# Patient Record
Sex: Female | Born: 1945 | Race: White | Hispanic: No | Smoking: Former smoker
Health system: Southern US, Community
[De-identification: ages and names within clinical notes are randomized; demographics above are authoritative.]

## PROBLEM LIST (undated history)

## (undated) DIAGNOSIS — M8580 Other specified disorders of bone density and structure, unspecified site: Secondary | ICD-10-CM

## (undated) DIAGNOSIS — K589 Irritable bowel syndrome without diarrhea: Secondary | ICD-10-CM

## (undated) DIAGNOSIS — F329 Major depressive disorder, single episode, unspecified: Secondary | ICD-10-CM

## (undated) DIAGNOSIS — J449 Chronic obstructive pulmonary disease, unspecified: Secondary | ICD-10-CM

## (undated) DIAGNOSIS — J45909 Unspecified asthma, uncomplicated: Secondary | ICD-10-CM

## (undated) DIAGNOSIS — R87810 Cervical high risk human papillomavirus (HPV) DNA test positive: Secondary | ICD-10-CM

## (undated) DIAGNOSIS — C50912 Malignant neoplasm of unspecified site of left female breast: Secondary | ICD-10-CM

## (undated) DIAGNOSIS — E785 Hyperlipidemia, unspecified: Secondary | ICD-10-CM

## (undated) DIAGNOSIS — I10 Essential (primary) hypertension: Secondary | ICD-10-CM

## (undated) DIAGNOSIS — K219 Gastro-esophageal reflux disease without esophagitis: Secondary | ICD-10-CM

## (undated) DIAGNOSIS — C541 Malignant neoplasm of endometrium: Secondary | ICD-10-CM

## (undated) DIAGNOSIS — F419 Anxiety disorder, unspecified: Secondary | ICD-10-CM

## (undated) DIAGNOSIS — G43909 Migraine, unspecified, not intractable, without status migrainosus: Secondary | ICD-10-CM

## (undated) DIAGNOSIS — I639 Cerebral infarction, unspecified: Secondary | ICD-10-CM

## (undated) DIAGNOSIS — R87610 Atypical squamous cells of undetermined significance on cytologic smear of cervix (ASC-US): Secondary | ICD-10-CM

## (undated) DIAGNOSIS — C50919 Malignant neoplasm of unspecified site of unspecified female breast: Secondary | ICD-10-CM

## (undated) DIAGNOSIS — M5136 Other intervertebral disc degeneration, lumbar region: Secondary | ICD-10-CM

## (undated) DIAGNOSIS — M51369 Other intervertebral disc degeneration, lumbar region without mention of lumbar back pain or lower extremity pain: Secondary | ICD-10-CM

## (undated) DIAGNOSIS — R011 Cardiac murmur, unspecified: Secondary | ICD-10-CM

## (undated) DIAGNOSIS — E119 Type 2 diabetes mellitus without complications: Secondary | ICD-10-CM

## (undated) DIAGNOSIS — F32A Depression, unspecified: Secondary | ICD-10-CM

## (undated) DIAGNOSIS — M1990 Unspecified osteoarthritis, unspecified site: Secondary | ICD-10-CM

## (undated) HISTORY — DX: Malignant neoplasm of unspecified site of unspecified female breast: C50.919

## (undated) HISTORY — DX: Cardiac murmur, unspecified: R01.1

## (undated) HISTORY — DX: Hyperlipidemia, unspecified: E78.5

## (undated) HISTORY — DX: Chronic obstructive pulmonary disease, unspecified: J44.9

## (undated) HISTORY — DX: Anxiety disorder, unspecified: F41.9

## (undated) HISTORY — DX: Irritable bowel syndrome, unspecified: K58.9

## (undated) HISTORY — PX: CHOLECYSTECTOMY, LAPAROSCOPIC: SHX56

## (undated) HISTORY — DX: Migraine, unspecified, not intractable, without status migrainosus: G43.909

## (undated) HISTORY — DX: Malignant neoplasm of unspecified site of left female breast: C50.912

## (undated) HISTORY — DX: Major depressive disorder, single episode, unspecified: F32.9

## (undated) HISTORY — DX: Cervical high risk human papillomavirus (HPV) DNA test positive: R87.810

## (undated) HISTORY — PX: ABDOMINAL HYSTERECTOMY: SHX81

## (undated) HISTORY — PX: EYE SURGERY: SHX253

## (undated) HISTORY — DX: Essential (primary) hypertension: I10

## (undated) HISTORY — PX: CHOLECYSTECTOMY: SHX55

## (undated) HISTORY — DX: Depression, unspecified: F32.A

## (undated) HISTORY — DX: Unspecified osteoarthritis, unspecified site: M19.90

## (undated) HISTORY — DX: Atypical squamous cells of undetermined significance on cytologic smear of cervix (ASC-US): R87.610

## (undated) HISTORY — PX: ESOPHAGOGASTRODUODENOSCOPY: SHX1529

---

## 1993-11-21 DIAGNOSIS — C50912 Malignant neoplasm of unspecified site of left female breast: Secondary | ICD-10-CM

## 1993-11-21 HISTORY — DX: Malignant neoplasm of unspecified site of left female breast: C50.912

## 2008-03-03 ENCOUNTER — Ambulatory Visit: Payer: Self-pay | Admitting: Gastroenterology

## 2010-10-01 ENCOUNTER — Emergency Department: Payer: Self-pay | Admitting: Unknown Physician Specialty

## 2010-10-12 ENCOUNTER — Ambulatory Visit: Payer: Self-pay | Admitting: Gastroenterology

## 2010-10-13 ENCOUNTER — Inpatient Hospital Stay: Payer: Self-pay | Admitting: Internal Medicine

## 2010-11-21 HISTORY — PX: BREAST LUMPECTOMY: SHX2

## 2010-12-13 ENCOUNTER — Ambulatory Visit: Payer: Self-pay | Admitting: General Surgery

## 2011-05-16 ENCOUNTER — Ambulatory Visit: Payer: Self-pay | Admitting: General Surgery

## 2011-05-31 ENCOUNTER — Ambulatory Visit: Payer: Self-pay | Admitting: General Surgery

## 2011-06-01 LAB — PATHOLOGY REPORT

## 2011-07-06 IMAGING — CR DG OUTSIDE FILMS CHEST
7 of 8 series · 7 of 8 positions shown · non-contrast
Comparison: none

[LM (1 of 7)]
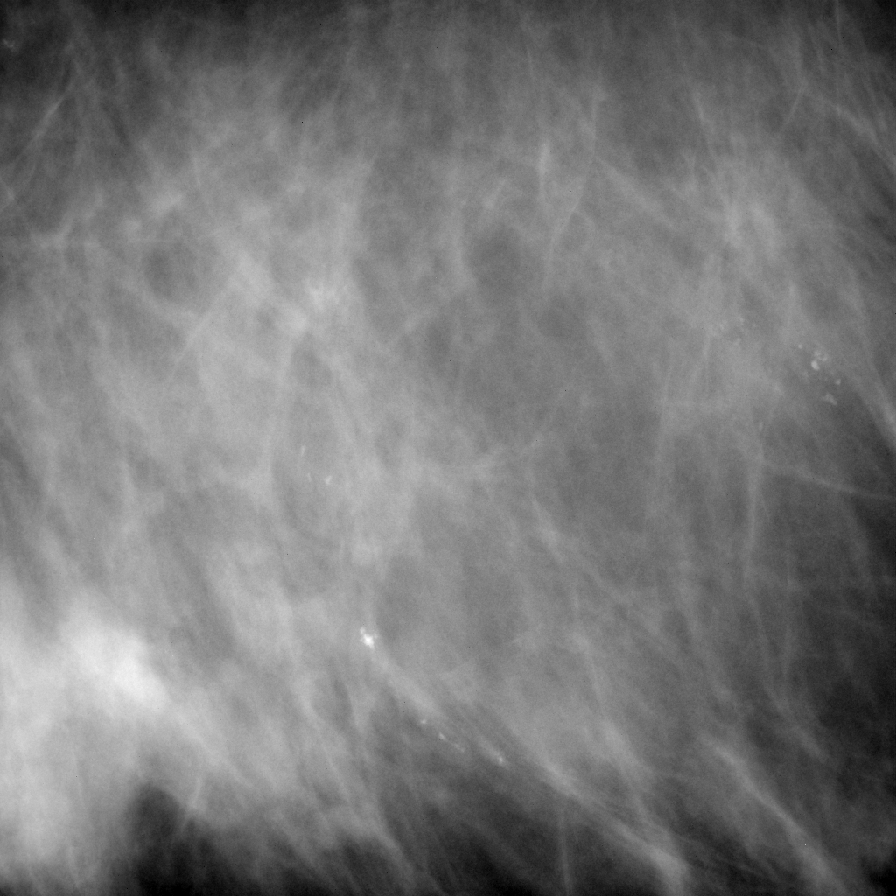

[LM (2 of 7)]
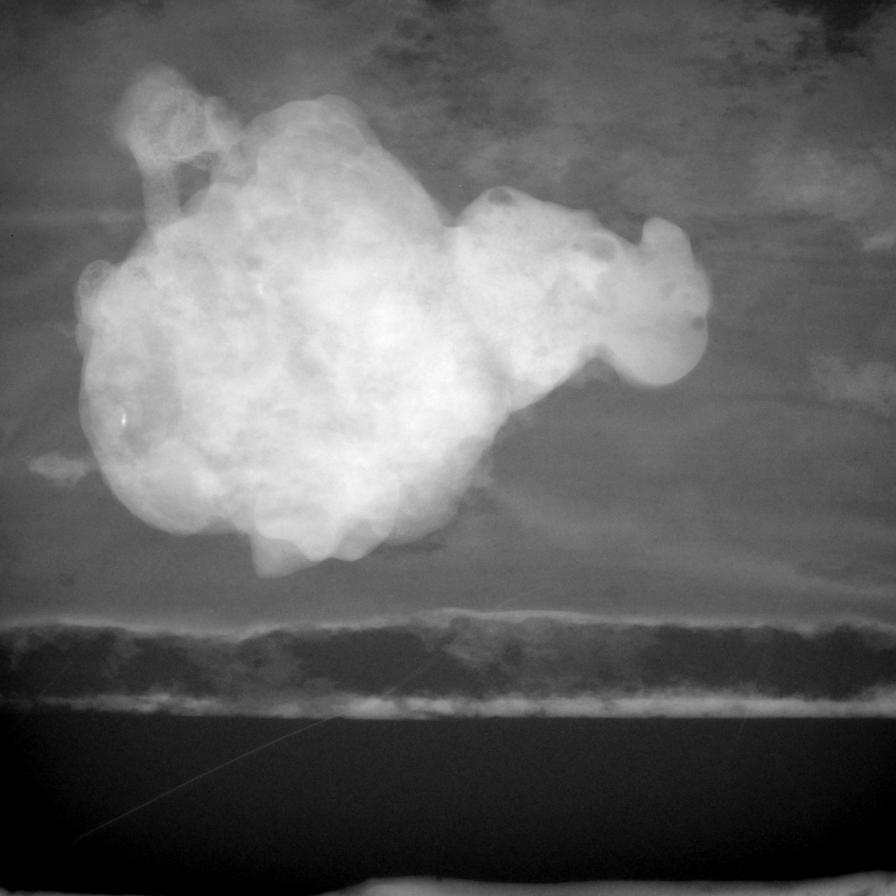

[LM (3 of 7)]
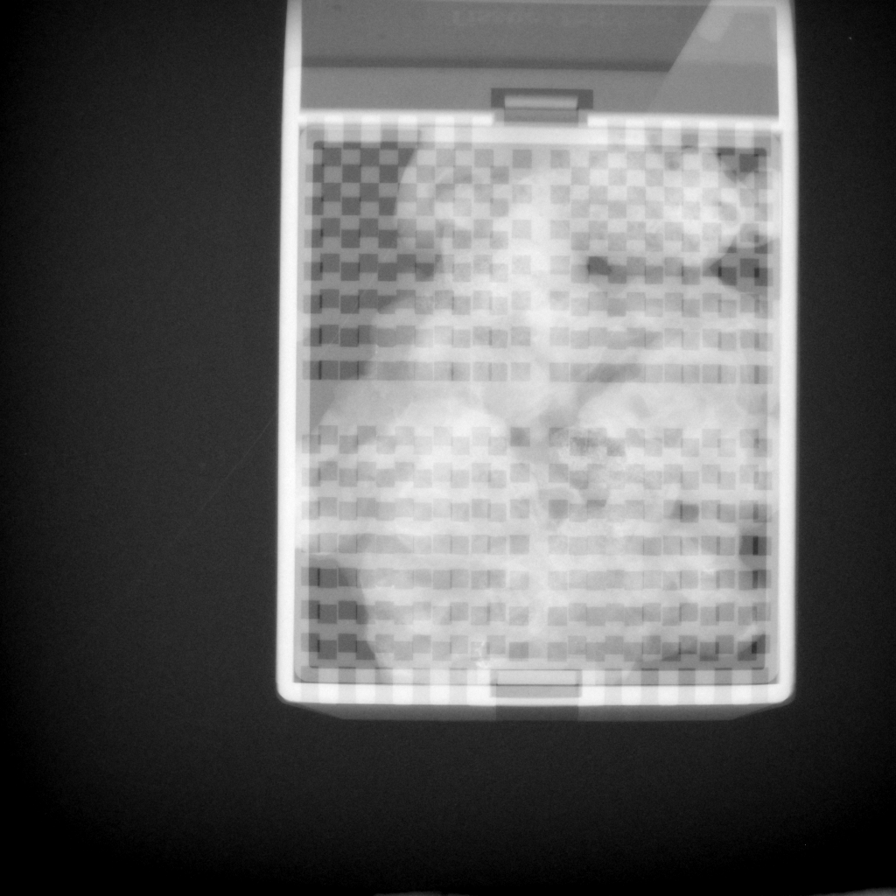

[LM (4 of 7)]
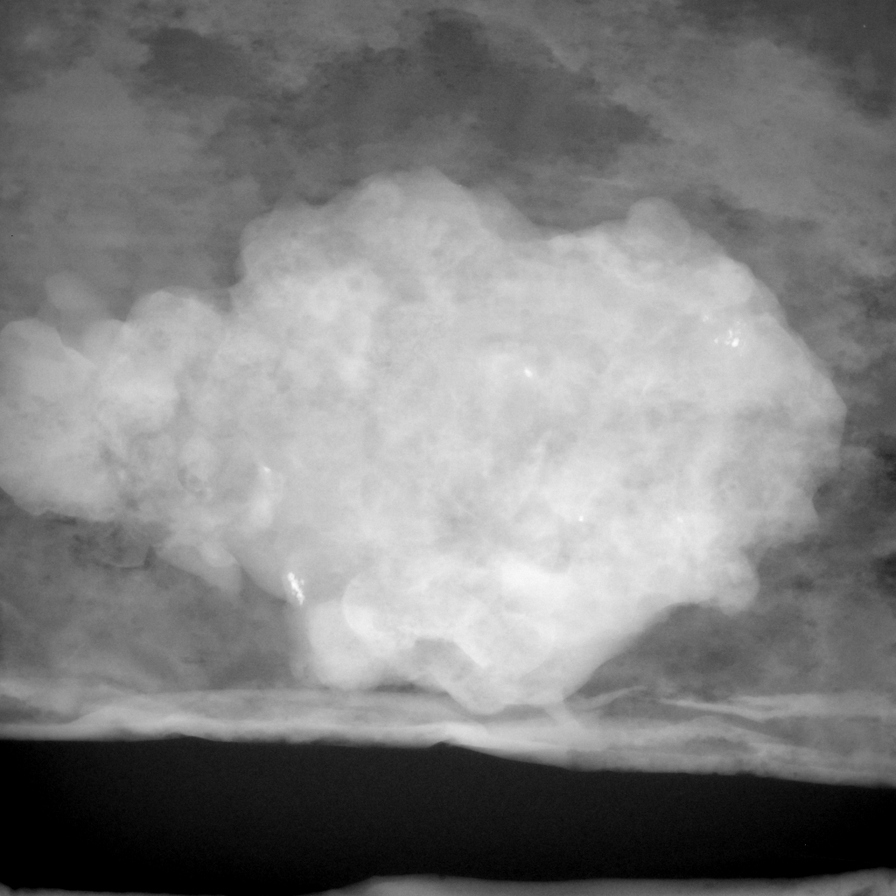

[LM (5 of 7)]
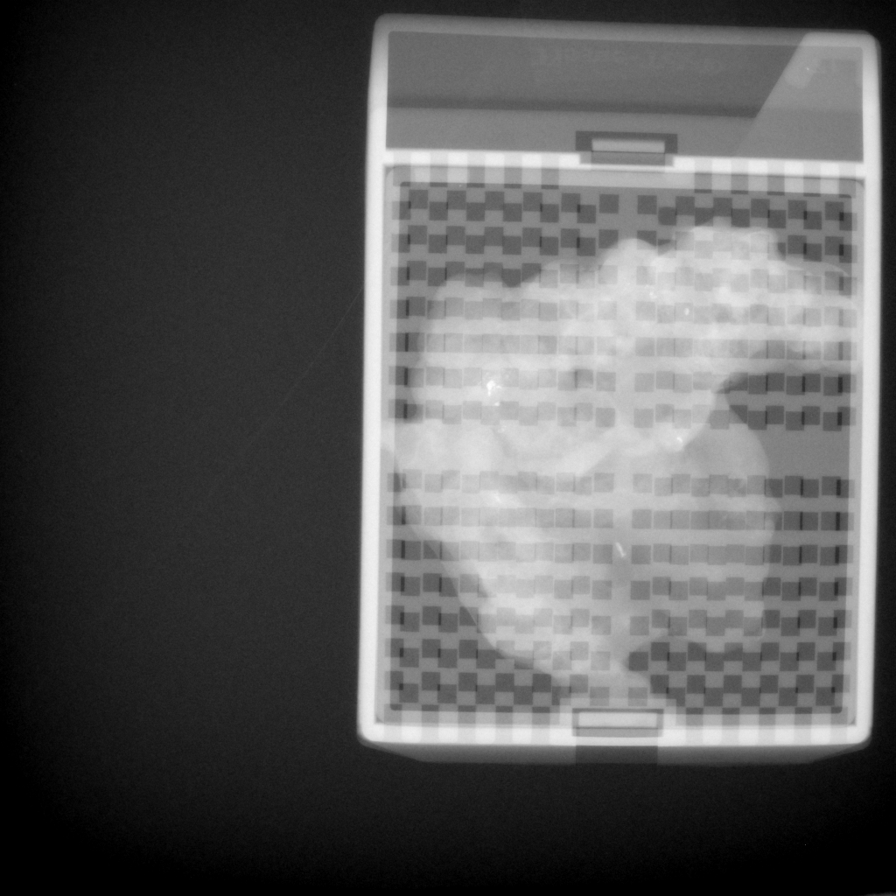

[LM (6 of 7)]
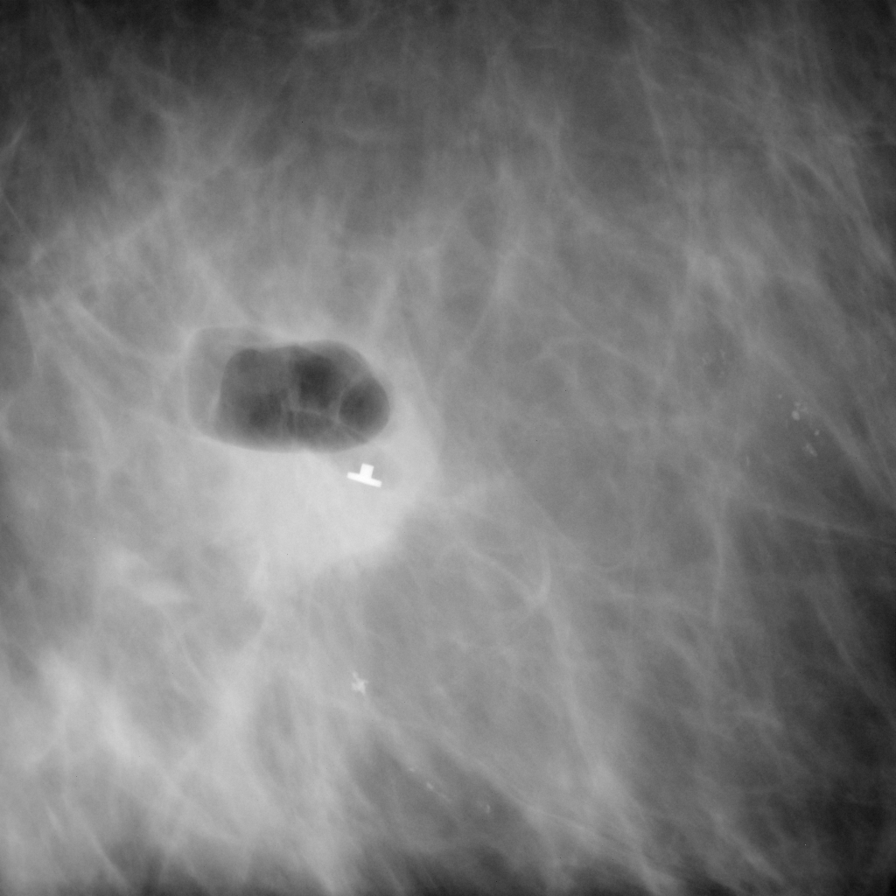

[LM (7 of 7)]
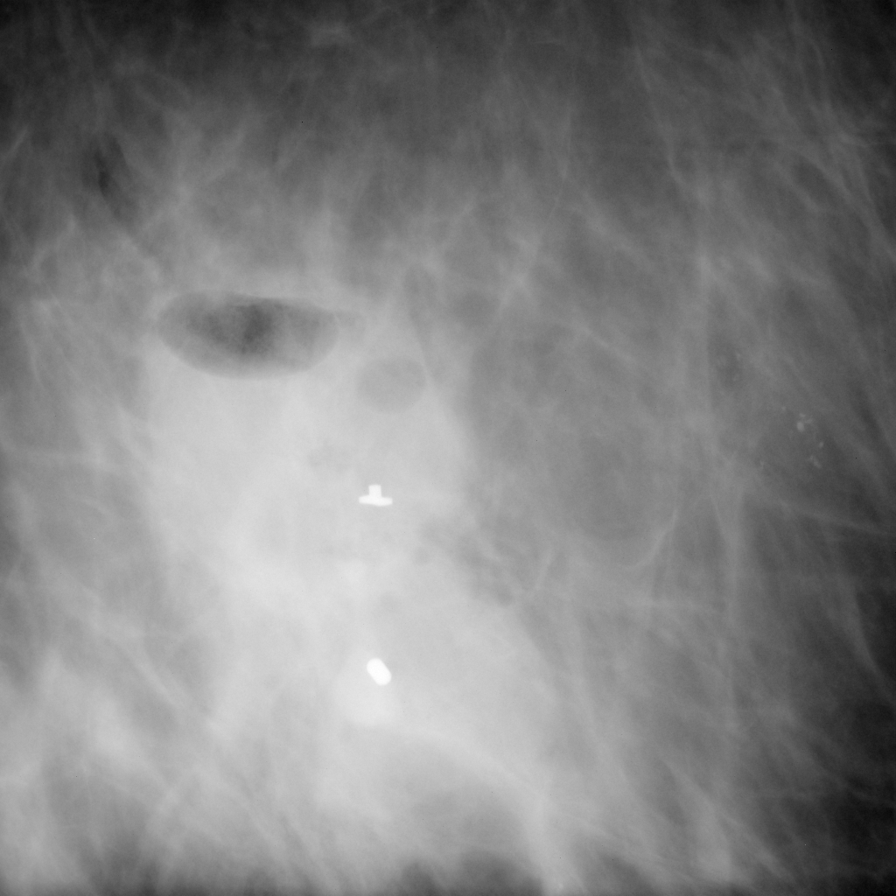

[7 of 8 positions shown; findings below may reference images not displayed]

**** An original report or order could not be provided from the [HOSPITAL] [facility] ****

## 2011-11-22 HISTORY — PX: CATARACT EXTRACTION: SUR2

## 2012-01-12 ENCOUNTER — Ambulatory Visit: Payer: Self-pay | Admitting: Ophthalmology

## 2012-01-16 ENCOUNTER — Ambulatory Visit: Payer: Self-pay | Admitting: Ophthalmology

## 2012-02-20 ENCOUNTER — Ambulatory Visit: Payer: Self-pay | Admitting: Ophthalmology

## 2012-11-08 ENCOUNTER — Ambulatory Visit: Payer: Self-pay | Admitting: Physical Medicine and Rehabilitation

## 2013-03-28 ENCOUNTER — Ambulatory Visit: Payer: Self-pay | Admitting: Gastroenterology

## 2013-07-23 ENCOUNTER — Ambulatory Visit: Payer: Self-pay

## 2014-05-19 DIAGNOSIS — M7540 Impingement syndrome of unspecified shoulder: Secondary | ICD-10-CM | POA: Insufficient documentation

## 2014-05-19 DIAGNOSIS — M5116 Intervertebral disc disorders with radiculopathy, lumbar region: Secondary | ICD-10-CM | POA: Insufficient documentation

## 2014-05-19 DIAGNOSIS — M5136 Other intervertebral disc degeneration, lumbar region: Secondary | ICD-10-CM | POA: Insufficient documentation

## 2014-05-19 DIAGNOSIS — M51369 Other intervertebral disc degeneration, lumbar region without mention of lumbar back pain or lower extremity pain: Secondary | ICD-10-CM | POA: Insufficient documentation

## 2014-06-27 DIAGNOSIS — E785 Hyperlipidemia, unspecified: Secondary | ICD-10-CM | POA: Insufficient documentation

## 2014-06-27 DIAGNOSIS — F17200 Nicotine dependence, unspecified, uncomplicated: Secondary | ICD-10-CM | POA: Insufficient documentation

## 2014-06-27 DIAGNOSIS — F32A Depression, unspecified: Secondary | ICD-10-CM | POA: Insufficient documentation

## 2014-06-27 DIAGNOSIS — K589 Irritable bowel syndrome without diarrhea: Secondary | ICD-10-CM | POA: Insufficient documentation

## 2014-06-27 DIAGNOSIS — Z8673 Personal history of transient ischemic attack (TIA), and cerebral infarction without residual deficits: Secondary | ICD-10-CM | POA: Insufficient documentation

## 2014-06-27 DIAGNOSIS — C50919 Malignant neoplasm of unspecified site of unspecified female breast: Secondary | ICD-10-CM | POA: Insufficient documentation

## 2014-06-27 DIAGNOSIS — J449 Chronic obstructive pulmonary disease, unspecified: Secondary | ICD-10-CM | POA: Insufficient documentation

## 2014-06-27 DIAGNOSIS — F419 Anxiety disorder, unspecified: Secondary | ICD-10-CM | POA: Insufficient documentation

## 2014-06-27 DIAGNOSIS — E119 Type 2 diabetes mellitus without complications: Secondary | ICD-10-CM | POA: Insufficient documentation

## 2014-06-27 DIAGNOSIS — F329 Major depressive disorder, single episode, unspecified: Secondary | ICD-10-CM | POA: Insufficient documentation

## 2014-06-27 DIAGNOSIS — I10 Essential (primary) hypertension: Secondary | ICD-10-CM

## 2014-06-27 HISTORY — DX: Essential (primary) hypertension: I10

## 2014-09-24 DIAGNOSIS — M7060 Trochanteric bursitis, unspecified hip: Secondary | ICD-10-CM | POA: Insufficient documentation

## 2014-11-21 DIAGNOSIS — C541 Malignant neoplasm of endometrium: Secondary | ICD-10-CM

## 2014-11-21 HISTORY — DX: Malignant neoplasm of endometrium: C54.1

## 2015-03-15 NOTE — Op Note (Signed)
PATIENT NAME:  Michelle, Hays MR#:  [id number] DATE OF BIRTH:  [birth year]  DATE OF PROCEDURE:  02/20/2012  PREOPERATIVE DIAGNOSIS:  Cataract, left eye.    POSTOPERATIVE DIAGNOSIS:  Cataract, left eye.  PROCEDURE PERFORMED:  Extracapsular cataract extraction using phacoemulsification with placement of an Alcon SN6CWS, 12.5-diopter posterior chamber lens, serial # [id number].  SURGEON:  Loura Back. Ashtynn Berke, MD  ASSISTANT:  None.  ANESTHESIA:  4% lidocaine and 0.75% Marcaine in a 50/50 mixture with 10 units per mL of Hylenex added, given as a peribulbar.  ANESTHESIOLOGIST:  Dr. Ronelle Nigh   COMPLICATIONS:  None.  ESTIMATED BLOOD LOSS:  Less than 1 mL.  DESCRIPTION OF PROCEDURE:  The patient was brought to the operating room and given a peribulbar block.  The patient was then prepped and draped in the usual fashion.  The vertical rectus muscles were imbricated using 5-0 silk sutures.  These sutures were then clamped to the sterile drapes as bridle sutures.  A limbal peritomy was performed extending two clock hours and hemostasis was obtained with cautery.  A partial thickness scleral groove was made at the surgical limbus and dissected anteriorly in a lamellar dissection using an Alcon crescent knife.  The anterior chamber was entered supero-temporally with a Superblade and through the lamellar dissection with a 2.6 mm keratome.  DisCoVisc was used to replace the aqueous and a continuous tear capsulorrhexis was carried out.  Hydrodissection and hydrodelineation were carried out with balanced salt and a 27 gauge canula.  The nucleus was rotated to confirm the effectiveness of the hydrodissection.  Phacoemulsification was carried out using a divide-and-conquer technique.  Total ultrasound time was 1 minute and 30 seconds with an average power of 18.6 percent, CDE 26.68.  Irrigation/aspiration was used to remove the residual cortex.  DisCoVisc was used to inflate the capsule and the internal  incision was enlarged to 3 mm with the crescent knife.  The intraocular lens was folded and inserted into the capsular bag using the AcrySert delivery system.  Irrigation/aspiration was used to remove the residual DisCoVisc.  Miostat was injected into the anterior chamber through the paracentesis track to inflate the anterior chamber and induce miosis.  The wound was checked for leaks and none were found. The conjunctiva was closed with cautery and the bridle sutures were removed.  Two drops of 0.3% Vigamox were placed on the eye.   An eye shield was placed on the eye.  The patient was discharged to the recovery room in good condition.  ____________________________ Loura Back Alynah Schone, MD sad:drc D: 02/20/2012 13:10:40 ET T: 02/20/2012 13:40:23 ET JOB#: [id number]  cc: Remo Lipps A. Tylia Ewell, MD, <Dictator> Martie Lee MD ELECTRONICALLY SIGNED 02/21/2012 13:56

## 2015-03-15 NOTE — Op Note (Signed)
PATIENT NAME:  Michelle Hays, Michelle Hays MR#:  [id number] DATE OF BIRTH:  December 08, 1945  DATE OF PROCEDURE:  01/16/2012  PREOPERATIVE DIAGNOSIS:  Cataract, right eye.   POSTOPERATIVE DIAGNOSIS:  Cataract, right eye.  PROCEDURE PERFORMED:  Extracapsular cataract extraction using phacoemulsification with placement of an Alcon SN6CWS, 13.5-diopter posterior chamber lens, serial # [id number].  SURGEON:  Loura Back. Shabnam Ladd, MD  ASSISTANT:  None.  ANESTHESIA:  4% lidocaine and 0.75% Marcaine in a 50/50 mixture with 10 units per mL of Hylenex added, given as a peribulbar.  ANESTHESIOLOGIST:  Dr. Marcello Moores   COMPLICATIONS:  None.  ESTIMATED BLOOD LOSS:  Less than 1 mL.  DESCRIPTION OF PROCEDURE:  The patient was brought to the operating room and given a peribulbar block.  The patient was then prepped and draped in the usual fashion.  The vertical rectus muscles were imbricated using 5-0 silk sutures.  These sutures were then clamped to the sterile drapes as bridle sutures.  A limbal peritomy was performed extending two clock hours and hemostasis was obtained with cautery.  A partial thickness scleral groove was made at the surgical limbus and dissected anteriorly in a lamellar dissection using an Alcon crescent knife.  The anterior chamber was entered superonasally with a Superblade and through the lamellar dissection with a 2.6 mm keratome.  DisCoVisc was used to replace the aqueous and a continuous tear capsulorrhexis was carried out.  Hydrodissection and hydrodelineation were carried out with balanced salt and a 27 gauge canula.  The nucleus was rotated to confirm the effectiveness of the hydrodissection.  Phacoemulsification was carried out using a divide-and-conquer technique.  Total ultrasound time was 1 minute and 17.9 seconds with an average power of 22.9 percent, CDE 31.74.  Irrigation/aspiration was used to remove the residual cortex.  DisCoVisc was used to inflate the capsule and the internal  incision was enlarged to 3 mm with the crescent knife.  The intraocular lens was folded and inserted into the capsular bag using the Acrysert delivery system.  Irrigation/aspiration was used to remove the residual DisCoVisc.  Miostat was injected into the anterior chamber through the paracentesis track to inflate the anterior chamber and induce miosis.  The wound was checked for leaks and none were found. The conjunctiva was closed with cautery and the bridle sutures were removed.  Two drops of 0.3% Vigamox were placed on the eye.   An eye shield was placed on the eye.  The patient was discharged to the recovery room in good condition.  ____________________________ Loura Back Aerabella Galasso, MD sad:drc D: 01/16/2012 12:55:23 ET T: 01/16/2012 13:14:57 ET JOB#: [id number]  cc: Remo Lipps A. Stasia Somero, MD, <Dictator> Martie Lee MD ELECTRONICALLY SIGNED 01/17/2012 15:09

## 2015-03-16 DIAGNOSIS — C541 Malignant neoplasm of endometrium: Secondary | ICD-10-CM | POA: Insufficient documentation

## 2015-03-16 DIAGNOSIS — R87610 Atypical squamous cells of undetermined significance on cytologic smear of cervix (ASC-US): Secondary | ICD-10-CM

## 2015-03-16 HISTORY — DX: Atypical squamous cells of undetermined significance on cytologic smear of cervix (ASC-US): R87.610

## 2015-03-18 ENCOUNTER — Other Ambulatory Visit: Payer: Self-pay | Admitting: Obstetrics and Gynecology

## 2015-03-18 DIAGNOSIS — C541 Malignant neoplasm of endometrium: Secondary | ICD-10-CM

## 2015-03-24 ENCOUNTER — Ambulatory Visit
Admission: RE | Admit: 2015-03-24 | Discharge: 2015-03-24 | Disposition: A | Discharge status: Home / Self Care | Payer: Medicare PPO | Source: Ambulatory Visit | Attending: Obstetrics and Gynecology | Admitting: Obstetrics and Gynecology

## 2015-03-24 DIAGNOSIS — K5730 Diverticulosis of large intestine without perforation or abscess without bleeding: Secondary | ICD-10-CM | POA: Diagnosis not present

## 2015-03-24 DIAGNOSIS — D259 Leiomyoma of uterus, unspecified: Secondary | ICD-10-CM | POA: Diagnosis not present

## 2015-03-24 DIAGNOSIS — C541 Malignant neoplasm of endometrium: Secondary | ICD-10-CM | POA: Diagnosis present

## 2015-03-24 HISTORY — DX: Unspecified asthma, uncomplicated: J45.909

## 2015-03-24 HISTORY — DX: Type 2 diabetes mellitus without complications: E11.9

## 2015-03-24 HISTORY — DX: Malignant neoplasm of endometrium: C54.1

## 2015-03-24 MED ORDER — IOHEXOL 300 MG/ML  SOLN
100.0000 mL | Freq: Once | INTRAMUSCULAR | Status: AC | PRN
  Administered 2015-03-24: 100 mL via INTRAVENOUS

## 2015-04-08 ENCOUNTER — Inpatient Hospital Stay: Payer: Medicare PPO | Attending: Obstetrics and Gynecology | Admitting: Obstetrics and Gynecology

## 2015-04-08 VITALS — BP 157/70 | HR 84 | Temp 97.5°F | Resp 19 | Ht 71.0 in | Wt 173.8 lb

## 2015-04-08 DIAGNOSIS — M5136 Other intervertebral disc degeneration, lumbar region: Secondary | ICD-10-CM

## 2015-04-08 DIAGNOSIS — D259 Leiomyoma of uterus, unspecified: Secondary | ICD-10-CM

## 2015-04-08 DIAGNOSIS — Z8673 Personal history of transient ischemic attack (TIA), and cerebral infarction without residual deficits: Secondary | ICD-10-CM

## 2015-04-08 DIAGNOSIS — J449 Chronic obstructive pulmonary disease, unspecified: Secondary | ICD-10-CM

## 2015-04-08 DIAGNOSIS — M1990 Unspecified osteoarthritis, unspecified site: Secondary | ICD-10-CM

## 2015-04-08 DIAGNOSIS — E119 Type 2 diabetes mellitus without complications: Secondary | ICD-10-CM | POA: Diagnosis not present

## 2015-04-08 DIAGNOSIS — Z801 Family history of malignant neoplasm of trachea, bronchus and lung: Secondary | ICD-10-CM

## 2015-04-08 DIAGNOSIS — C541 Malignant neoplasm of endometrium: Secondary | ICD-10-CM

## 2015-04-08 DIAGNOSIS — F17210 Nicotine dependence, cigarettes, uncomplicated: Secondary | ICD-10-CM

## 2015-04-08 DIAGNOSIS — Z79899 Other long term (current) drug therapy: Secondary | ICD-10-CM

## 2015-04-08 DIAGNOSIS — E785 Hyperlipidemia, unspecified: Secondary | ICD-10-CM

## 2015-04-08 DIAGNOSIS — Z803 Family history of malignant neoplasm of breast: Secondary | ICD-10-CM

## 2015-04-08 NOTE — Progress Notes (Signed)
Gynecologic Oncology Consult Note  Referring physician: Dr. Laverta Baltimore M.D.  Chief Complaint: Endometrial cancer surveillance.   Subjective:  Michelle Hays is a 69 y.o. woman who presents today for newly diagnosed endometrial cancer by Dr. Ouida Sills.   Oncology Treatment History: The patient presented to Dr. Ouida Sills in consultation from Dr. [name] for abdominal cramping and an ultrasound demonstrating a thickened endometrial stripe measuring 12 mm. Dr. Ouida Sills performed Pap smear as well as endometrial biopsy on 03/16/2015. Her uterus sounded to 7 cm. The Pap smear demonstrated atypical squamous cells of undetermined significance. She has no history of prior abnormal Paps per her report. 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  03/24/2015 CT scan abdomen and pelvis IMPRESSION: Known endometrial cancer is not evident on CT. Multiple calcified uterine fibroids. No findings specific for metastatic disease.  She has multiple significant medical comorbidities specifically COPD and a greater than 100 pack year tobacco history. She has an albuterol inhaler and has occasional flares. When she has a flare she has an albuterol treatment; she has never required intubation. She tolerated anesthesia well when she had her cholecystectomy.  Problem List: Patient Active Problem List   Diagnosis Date Noted  . Endometrial cancer 03/16/2015  . Bursitis, trochanteric 09/24/2014  . Anxiety 06/27/2014  . Breast CA 06/27/2014  . CAFL (chronic airflow limitation) 06/27/2014  . Clinical depression 06/27/2014  . Diabetes 06/27/2014  . Cerebrovascular accident, old 06/27/2014  . BP (high blood pressure) 06/27/2014  . HLD (hyperlipidemia) 06/27/2014  . Adaptive colitis 06/27/2014  . Compulsive tobacco user syndrome 06/27/2014  . DDD (degenerative disc disease),  lumbar 05/19/2014  . Impingement syndrome of shoulder 05/19/2014  . Neuritis or radiculitis due to rupture of lumbar intervertebral disc 05/19/2014    Past Medical History: Past Medical History  Diagnosis Date  . Asthma   . Breast cancer, left breast 1995    Left Breast   . Endometrial cancer determined by uterine biopsy 2016  . Diabetes mellitus without complication     Patient takes Januvia  . BP (high blood pressure) 06/27/2014  . IBS (irritable bowel syndrome)   . Hyperlipidemia   . COPD (chronic obstructive pulmonary disease)   . Depression   . Anxiety   . Atypical squamous cell changes of undetermined significance (ASCUS) on cervical cytology with positive high risk human papilloma virus (HPV) 03/16/15  . Breast cancer   . Heart murmur   . Migraine   . Arthritis     Past Surgical History: Past Surgical History  Procedure Laterality Date  . Cholecystectomy, laparoscopic    . Breast lumpectomy  2012    [place]-bilateral breast lumpectomy  . Esophagogastroduodenoscopy  03/02/2008, 10/12/2010, 03/28/2013  . Cataract extraction  2013    Family History: Family History  Problem Relation Age of Onset  . Lung cancer Mother     BONE, BRAIN METS  . Brain cancer Mother   . Breast cancer Maternal Aunt   . Coronary artery disease Father   . Diabetes Mellitus II Mother   . Hyperlipidemia Brother     X 2 BROTHERS  . Hypertension Brother   . Hypertension Mother     X 2 BROTHERS  . Hypertension Sister     Social History: History   Social History  . Marital Status: Married    Spouse Name: N/A  . Number of Children: N/A  . Years of Education: N/A  Occupational History  . Not on file.   Social History Main Topics  . Smoking status: Current Every Day Smoker -- 2.00 packs/day for 52 years    Types: Cigarettes  . Smokeless tobacco: Never Used  . Alcohol Use: No  . Drug Use: No  . Sexual Activity: Not on file   Other Topics Concern  . Not on file   Social History  Narrative    Allergies: Allergies  Allergen Reactions  . Sulfa Antibiotics     Other reaction(s): Unknown    Current Medications: Current Outpatient Prescriptions  Medication Sig Dispense Refill  . albuterol (PROVENTIL HFA;VENTOLIN HFA) 108 (90 BASE) MCG/ACT inhaler Inhale 1 puff into the lungs as needed. For wheezing    . ARIPiprazole (ABILIFY) 5 MG tablet Take 5 mg by mouth daily.    Marland Kitchen aspirin EC 81 MG tablet Take 1 tablet by mouth daily.    Marland Kitchen atorvastatin (LIPITOR) 40 MG tablet Take 40 mg by mouth daily.    . DULoxetine (CYMBALTA) 60 MG capsule Take 60 mg by mouth daily.    Marland Kitchen gabapentin (NEURONTIN) 300 MG capsule Take 300 mg by mouth daily.    Marland Kitchen HYDROcodone-acetaminophen (NORCO/VICODIN) 5-325 MG per tablet Take 1 tablet by mouth 2 (two) times daily. As needed for pain    . omeprazole (PRILOSEC) 20 MG capsule Take 20 mg by mouth 2 (two) times daily.    . sitaGLIPtin (JANUVIA) 100 MG tablet Take 100 mg by mouth daily.    Marland Kitchen OLANZapine (ZYPREXA) 2.5 MG tablet Take 2.5 mg by mouth daily.     No current facility-administered medications for this visit.      Review of Systems A comprehensive review of systems was negative except for chronic pulmonary issues, arthritic pain,   Objective:  BP 157/70 mmHg  Pulse 84  Temp(Src) 97.5 F (36.4 C) (Oral)  Resp 19  Ht 5\' 11"  (1.803 m)  Wt 173 lb 13.3 oz (78.85 kg)  BMI 24.26 kg/m2  ECOG Performance Status: 0 - Asymptomatic  General appearance: alert, cooperative and appears stated age 69: ATNC Lymph node survey: negative axillary, inguinal, supraclavicular nodes Cardiovascular: regular rate and rhythm Respiratory: clear to auscultation Abdomen: no palpable masses, no hernias, well healed laparoscopic incisions,soft, nontender, nondistended, no ascites Extremities: no lower extremity edema Neurological exam reveals alert, oriented, normal speech, no focal findings or movement disorder noted  Pelvic: Vulva: vulvar erythema and  vulvar hypopigmentation c/w lichen sclerosus; Vagina: normal vagina; Adnexa: normal adnexa in size, nontender and no masses; Uterus: uterus is normal size, shape, consistency and nontender; Cervix: ectropion visible. No gross lesions and not rigid to palpation; Rectal: not done   Assessment:  Michelle Hays is a 69 y.o. female with a clinical stage 1, grade 2 endometrioid endometrial cancer. 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  ASCUS Pap smear, no prior abnormal Paps. Multiple co-morbidities of biggest concern is her COPD and pulmonary reserve to tolerate Trendelenburg position for minimally invasive surgery.   Plan:   Problem List Items Addressed This Visit      Genitourinary   Endometrial cancer - Primary     I discussed various options with the patient. I have recommended a referral to pulmonology to assess her pulmonary risks and assess for surgical clearance. She understands that she may not tolerate Trendelenburg position and minimally invasive surgery may not be possible. In this case we may need to consider either to vaginal hysterectomy or laparotomy.   She is very interested in proceeding with surgery.  The risks of surgery were discussed in detail and she understands these to include infection; wound separation; hernia; vaginal cuff separation, injury to adjacent organs such as bowel, bladder, blood vessels, ureters and nerves; bleeding which may require blood transfusion; anesthesia risk; thromboembolic events; possible death; unforeseen complications; possible need for re-exploration; medical complications such as heart attack, stroke, pleural effusion and pneumonia; and, if staging performed the risk of lymphedema and lymphocyst.    The patient will receive DVT and antibiotic prophylaxis as indicated.  EKG and CXR were ordered. She voiced a clear understanding.  She needs to stop her aspirin 7-10 days before surgery and I recommended she reduce smoking.   She had the opportunity to ask  questions and written informed consent was obtained today.  Gillis Ends, MD    CC:  Dr. Laverta Baltimore M.D.

## 2015-04-23 ENCOUNTER — Telehealth: Payer: Self-pay

## 2015-04-23 NOTE — Telephone Encounter (Signed)
Patient returned call. Surgery date given to patient.  Surgery set up for 05/20/15.  Preop 05/04/15 at 915 am [facility], [address].  Teach back process performed with patient.  Pt appreciate of call back.

## 2015-04-23 NOTE — Telephone Encounter (Signed)
Attempted to contact patient to discuss her pre op testing scheduled for 05-04-15. Left message to return call to Renita Papa, RN.

## 2015-04-27 ENCOUNTER — Ambulatory Visit
Admission: RE | Admit: 2015-04-27 | Discharge: 2015-04-27 | Disposition: A | Discharge status: Home / Self Care | Payer: Medicare PPO | Source: Ambulatory Visit | Attending: Obstetrics and Gynecology | Admitting: Obstetrics and Gynecology

## 2015-04-27 ENCOUNTER — Encounter
Admission: RE | Admit: 2015-04-27 | Discharge: 2015-04-27 | Disposition: A | Discharge status: Home / Self Care | Payer: Medicare PPO | Source: Ambulatory Visit | Attending: Obstetrics and Gynecology | Admitting: Obstetrics and Gynecology

## 2015-04-27 VITALS — BP 141/79 | HR 98 | Ht 69.5 in | Wt 173.0 lb

## 2015-04-27 DIAGNOSIS — I10 Essential (primary) hypertension: Secondary | ICD-10-CM | POA: Insufficient documentation

## 2015-04-27 DIAGNOSIS — E785 Hyperlipidemia, unspecified: Secondary | ICD-10-CM | POA: Insufficient documentation

## 2015-04-27 DIAGNOSIS — J45909 Unspecified asthma, uncomplicated: Secondary | ICD-10-CM | POA: Diagnosis not present

## 2015-04-27 DIAGNOSIS — Z853 Personal history of malignant neoplasm of breast: Secondary | ICD-10-CM | POA: Insufficient documentation

## 2015-04-27 DIAGNOSIS — Z882 Allergy status to sulfonamides status: Secondary | ICD-10-CM | POA: Diagnosis not present

## 2015-04-27 DIAGNOSIS — J449 Chronic obstructive pulmonary disease, unspecified: Secondary | ICD-10-CM

## 2015-04-27 DIAGNOSIS — F329 Major depressive disorder, single episode, unspecified: Secondary | ICD-10-CM | POA: Diagnosis not present

## 2015-04-27 DIAGNOSIS — J439 Emphysema, unspecified: Secondary | ICD-10-CM

## 2015-04-27 DIAGNOSIS — E119 Type 2 diabetes mellitus without complications: Secondary | ICD-10-CM | POA: Diagnosis not present

## 2015-04-27 DIAGNOSIS — C541 Malignant neoplasm of endometrium: Secondary | ICD-10-CM | POA: Insufficient documentation

## 2015-04-27 DIAGNOSIS — R011 Cardiac murmur, unspecified: Secondary | ICD-10-CM | POA: Diagnosis not present

## 2015-04-27 DIAGNOSIS — Z01810 Encounter for preprocedural cardiovascular examination: Secondary | ICD-10-CM | POA: Diagnosis present

## 2015-04-27 DIAGNOSIS — K598 Other specified functional intestinal disorders: Secondary | ICD-10-CM | POA: Diagnosis not present

## 2015-04-27 DIAGNOSIS — Z01811 Encounter for preprocedural respiratory examination: Secondary | ICD-10-CM | POA: Diagnosis present

## 2015-04-27 DIAGNOSIS — Z8673 Personal history of transient ischemic attack (TIA), and cerebral infarction without residual deficits: Secondary | ICD-10-CM | POA: Diagnosis not present

## 2015-04-27 DIAGNOSIS — F17200 Nicotine dependence, unspecified, uncomplicated: Secondary | ICD-10-CM | POA: Insufficient documentation

## 2015-04-27 DIAGNOSIS — Z01812 Encounter for preprocedural laboratory examination: Secondary | ICD-10-CM | POA: Insufficient documentation

## 2015-04-27 HISTORY — DX: Gastro-esophageal reflux disease without esophagitis: K21.9

## 2015-04-27 HISTORY — DX: Other intervertebral disc degeneration, lumbar region: M51.36

## 2015-04-27 HISTORY — DX: Other intervertebral disc degeneration, lumbar region without mention of lumbar back pain or lower extremity pain: M51.369

## 2015-04-27 HISTORY — DX: Cerebral infarction, unspecified: I63.9

## 2015-04-27 LAB — COMPREHENSIVE METABOLIC PANEL
ALBUMIN: 3.8 g/dL (ref 3.5–5.0)
ALK PHOS: 77 U/L (ref 38–126)
ALT: 11 U/L — ABNORMAL LOW (ref 14–54)
ANION GAP: 7 (ref 5–15)
AST: 17 U/L (ref 15–41)
BUN: 10 mg/dL (ref 6–20)
CALCIUM: 9.1 mg/dL (ref 8.9–10.3)
CO2: 30 mmol/L (ref 22–32)
CREATININE: 0.84 mg/dL (ref 0.44–1.00)
Chloride: 101 mmol/L (ref 101–111)
GFR calc Af Amer: >60 mL/min (ref >60)
GFR calc non Af Amer: >60 mL/min (ref >60)
Glucose, Bld: 100 mg/dL — ABNORMAL HIGH (ref 65–99)
POTASSIUM: 4 mmol/L (ref 3.5–5.1)
Sodium: 138 mmol/L (ref 135–145)
Total Bilirubin: 0.5 mg/dL (ref 0.3–1.2)
Total Protein: 7.6 g/dL (ref 6.5–8.1)

## 2015-04-27 LAB — TYPE AND SCREEN
ABO/RH(D): A POS
Antibody Screen: NEGATIVE

## 2015-04-27 LAB — CBC
HCT: 44.9 % (ref 35.0–47.0)
HEMOGLOBIN: 14.4 g/dL (ref 12.0–16.0)
MCH: 27.7 pg (ref 26.0–34.0)
MCHC: 31.9 g/dL — AB (ref 32.0–36.0)
MCV: 86.7 fL (ref 80.0–100.0)
Platelets: 284 10*3/uL (ref 150–440)
RBC: 5.18 MIL/uL (ref 3.80–5.20)
RDW: 14.6 % — AB (ref 11.5–14.5)
WBC: 14.7 10*3/uL — ABNORMAL HIGH (ref 3.6–11.0)

## 2015-04-27 LAB — ABO/RH: ABO/RH(D): A POS

## 2015-04-27 NOTE — Patient Instructions (Signed)
  Your procedure is scheduled on: May 20, 2015 (Wednesday)Report to Same Day Surgery. To find out your arrival time please call [phone] between 1PM - 3PM on May 19, 2015 (Tuesday).  Remember: Instructions that are not followed completely may result in serious medical risk, up to and including death, or upon the discretion of your surgeon and anesthesiologist your surgery may need to be rescheduled.    __x__ 1. Do not eat food or drink liquids after midnight. No gum chewing or hard candies.     ____ 2. No Alcohol for 24 hours before or after surgery.   ____ 3. Bring all medications with you on the day of surgery if instructed.    __x_ 4. Notify your doctor if there is any change in your medical condition     (cold, fever, infections).     Do not wear jewelry, make-up, hairpins, clips or nail polish.  Do not wear lotions, powders, or perfumes. You may wear deodorant.  Do not shave 48 hours prior to surgery. Men may shave face and neck.  Do not bring valuables to the hospital.    [facility] is not responsible for any belongings or valuables.               Contacts, dentures or bridgework may not be worn into surgery.  Leave your suitcase in the car. After surgery it may be brought to your room.  For patients admitted to the hospital, discharge time is determined by your                treatment team.   Patients discharged the day of surgery will not be allowed to drive home.   Please read over the following fact sheets that you were given:   Surgical Site Infection Prevention   ____ Take these medicines the morning of surgery with A SIP OF WATER:    1. Gabapentin  2. Omeprazole  3.   4.  5.  6.  ____ Fleet Enema (as directed)   __x__ Use CHG Soap as directed  __x_ Use inhalers on the day of surgery (Albuterol inhaler and bring to hospital)  ____ Stop metformin 2 days prior to surgery    ____ Take 1/2 of usual insulin dose the night before surgery and none on the  morning of surgery.   __x__ Stop Coumadin/Plavix/aspirin on one week before surgery ____ Stop Anti-inflammatories on    ____ Stop supplements until after surgery.    ____ Bring C-Pap to the hospital.

## 2015-05-04 ENCOUNTER — Other Ambulatory Visit: Payer: Medicare PPO

## 2015-05-07 NOTE — H&P (Addendum)
Gynecologic Oncology Consult Note  Referring physician: Dr. Laverta Baltimore M.D.  Chief Complaint: Endometrial cancer surveillance.   Subjective:  Michelle Hays is a 69 y.o. woman who presents today for newly diagnosed endometrial cancer by Dr. Ouida Sills.   Oncology Treatment History: The patient presented to Dr. Ouida Sills in consultation from Dr. [name] for abdominal cramping and an ultrasound demonstrating a thickened endometrial stripe measuring 12 mm. Dr. Ouida Sills performed Pap smear as well as endometrial biopsy on 03/16/2015. Her uterus sounded to 7 cm. The Pap smear demonstrated atypical squamous cells of undetermined significance. She has no history of prior abnormal Paps per her report. 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  03/24/2015 CT scan abdomen and pelvis IMPRESSION: Known endometrial cancer is not evident on CT. Multiple calcified uterine fibroids. No findings specific for metastatic disease.  She has multiple significant medical comorbidities specifically COPD and a greater than 100 pack year tobacco history. She has an albuterol inhaler and has occasional flares. When she has a flare she has an albuterol treatment; she has never required intubation. She tolerated anesthesia well when she had her cholecystectomy.  Problem List: Patient Active Problem List   Diagnosis Date Noted  . Endometrial cancer 03/16/2015  . Bursitis, trochanteric 09/24/2014  . Anxiety 06/27/2014  . Breast CA 06/27/2014  . CAFL (chronic airflow limitation) 06/27/2014  . Clinical depression 06/27/2014  . Diabetes 06/27/2014  . Cerebrovascular accident, old 06/27/2014  . BP (high blood pressure) 06/27/2014  . HLD (hyperlipidemia) 06/27/2014  . Adaptive colitis 06/27/2014  . Compulsive tobacco user syndrome  06/27/2014  . DDD (degenerative disc disease), lumbar 05/19/2014  . Impingement syndrome of shoulder 05/19/2014  . Neuritis or radiculitis due to rupture of lumbar intervertebral disc 05/19/2014    Past Medical History: Past Medical History  Diagnosis Date  . Asthma   . Breast cancer, left breast 1995    Left Breast   . Endometrial cancer determined by uterine biopsy 2016  . Diabetes mellitus without complication     Patient takes Januvia  . BP (high blood pressure) 06/27/2014  . IBS (irritable bowel syndrome)   . Hyperlipidemia   . COPD (chronic obstructive pulmonary disease)   . Depression   . Anxiety   . Atypical squamous cell changes of undetermined significance (ASCUS) on cervical cytology with positive high risk human papilloma virus (HPV) 03/16/15  . Breast cancer   . Heart murmur   . Migraine   . Arthritis     Past Surgical History: Past Surgical History  Procedure Laterality Date  . Cholecystectomy, laparoscopic    . Breast lumpectomy  2012    [place]-bilateral breast lumpectomy  . Esophagogastroduodenoscopy  03/02/2008, 10/12/2010, 03/28/2013  . Cataract extraction  2013    Family History: Family History  Problem Relation Age of Onset  . Lung cancer Mother     BONE, BRAIN METS  . Brain cancer Mother   . Breast cancer Maternal Aunt   . Coronary artery disease Father   . Diabetes Mellitus II Mother   . Hyperlipidemia Brother     X 2 BROTHERS  . Hypertension Brother   . Hypertension Mother     X 2 BROTHERS  . Hypertension Sister     Social History: History   Social History  . Marital Status: Married    Spouse Name: N/A  . Number of Children: N/A  . Years of Education: N/A  Occupational History  . Not on file.   Social History Main Topics  . Smoking status: Current Every Day Smoker --  2.00 packs/day for 52 years    Types: Cigarettes  . Smokeless tobacco: Never Used  . Alcohol Use: No  . Drug Use: No  . Sexual Activity: Not on file   Other Topics Concern  . Not on file   Social History Narrative    Allergies: Allergies  Allergen Reactions  . Sulfa Antibiotics     Other reaction(s): Unknown    Current Medications: Current Outpatient Prescriptions  Medication Sig Dispense Refill  . albuterol (PROVENTIL HFA;VENTOLIN HFA) 108 (90 BASE) MCG/ACT inhaler Inhale 1 puff into the lungs as needed. For wheezing    . ARIPiprazole (ABILIFY) 5 MG tablet Take 5 mg by mouth daily.    Marland Kitchen aspirin EC 81 MG tablet Take 1 tablet by mouth daily.    Marland Kitchen atorvastatin (LIPITOR) 40 MG tablet Take 40 mg by mouth daily.    . DULoxetine (CYMBALTA) 60 MG capsule Take 60 mg by mouth daily.    Marland Kitchen gabapentin (NEURONTIN) 300 MG capsule Take 300 mg by mouth daily.    Marland Kitchen HYDROcodone-acetaminophen (NORCO/VICODIN) 5-325 MG per tablet Take 1 tablet by mouth 2 (two) times daily. As needed for pain    . omeprazole (PRILOSEC) 20 MG capsule Take 20 mg by mouth 2 (two) times daily.    . sitaGLIPtin (JANUVIA) 100 MG tablet Take 100 mg by mouth daily.    Marland Kitchen OLANZapine (ZYPREXA) 2.5 MG tablet Take 2.5 mg by mouth daily.     No current facility-administered medications for this visit.     Review of Systems A comprehensive review of systems was negative except for chronic pulmonary issues, arthritic pain,   Objective:  BP 157/70 mmHg  Pulse 84  Temp(Src) 97.5 F (36.4 C) (Oral)  Resp 19  Ht 5\' 11"  (1.803 m)  Wt 173 lb 13.3 oz (78.85 kg)  BMI 24.26 kg/m2  ECOG Performance Status: 0 - Asymptomatic  General appearance: alert, cooperative and appears stated age 89: ATNC Lymph node survey: negative axillary, inguinal, supraclavicular nodes Cardiovascular: regular rate and rhythm Respiratory: clear to  auscultation Abdomen: no palpable masses, no hernias, well healed laparoscopic incisions,soft, nontender, nondistended, no ascites Extremities: no lower extremity edema Neurological exam reveals alert, oriented, normal speech, no focal findings or movement disorder noted  Pelvic: Vulva: vulvar erythema and vulvar hypopigmentation c/w lichen sclerosus; Vagina: normal vagina; Adnexa: normal adnexa in size, nontender and no masses; Uterus: uterus is normal size, shape, consistency and nontender; Cervix: ectropion visible. No gross lesions and not rigid to palpation; Rectal: not done   Assessment:  Michelle Hays is a 69 y.o. female with a clinical stage 1, grade 2 endometrioid endometrial cancer. 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 ASCUS Pap smear, no prior abnormal Paps. Multiple co-morbidities of biggest concern is her COPD and pulmonary reserve to tolerate Trendelenburg position for minimally invasive surgery.   Plan:   Problem List Items Addressed This Visit      Genitourinary   Endometrial cancer - Primary     I discussed various options with the patient. I have recommended a referral to pulmonology to assess her pulmonary risks and assess for surgical clearance. She understands that she may not tolerate Trendelenburg position and minimally invasive surgery may not be possible. In this case we may need to consider either to vaginal hysterectomy or laparotomy.   She is very interested in proceeding with surgery. The risks  of surgery were discussed in detail and she understands these to include infection; wound separation; hernia; vaginal cuff separation, injury to adjacent organs such as bowel, bladder, blood vessels, ureters and nerves; bleeding which may require blood transfusion; anesthesia risk; thromboembolic events; possible death; unforeseen complications; possible need for re-exploration; medical complications such as heart attack, stroke, pleural effusion and pneumonia; and, if  staging performed the risk of lymphedema and lymphocyst.   The patient will receive DVT and antibiotic prophylaxis as indicated. EKG and CXR were ordered. She voiced a clear understanding. She needs to stop her aspirin 7-10 days before surgery and I recommended she reduce smoking.   She had the opportunity to ask questions and written informed consent was obtained today.  Gillis Ends, MD    CC:  Dr. Laverta Baltimore M.D.       ADDENDUM Update H&P 06/292016 Michelle Hays has no significant changes since she was last seen. Her labs are acceptable for surgery. Consent is signed. We will proceed with surgery as planned. CXR and CT scan ok to proceed. Patient stopped aspirin 2 weeks ago. She continues to smoke.  She was seen by pulmonary and was cleared for surgery per the patient. The records are not in EPIC.  Gillis Ends, MD

## 2015-05-20 ENCOUNTER — Encounter
Admission: RE | Disposition: A | Discharge status: Home / Self Care | Payer: Self-pay | Source: Ambulatory Visit | Attending: Obstetrics and Gynecology

## 2015-05-20 ENCOUNTER — Observation Stay
Admission: RE | Admit: 2015-05-20 | Discharge: 2015-05-21 | Disposition: A | Discharge status: Home / Self Care | Payer: Medicare PPO | Source: Ambulatory Visit | Attending: Obstetrics and Gynecology | Admitting: Obstetrics and Gynecology

## 2015-05-20 ENCOUNTER — Inpatient Hospital Stay: Payer: Medicare PPO | Admitting: Anesthesiology

## 2015-05-20 ENCOUNTER — Encounter: Payer: Self-pay | Admitting: *Deleted

## 2015-05-20 DIAGNOSIS — R011 Cardiac murmur, unspecified: Secondary | ICD-10-CM | POA: Insufficient documentation

## 2015-05-20 DIAGNOSIS — Z8673 Personal history of transient ischemic attack (TIA), and cerebral infarction without residual deficits: Secondary | ICD-10-CM | POA: Diagnosis not present

## 2015-05-20 DIAGNOSIS — Z9889 Other specified postprocedural states: Secondary | ICD-10-CM

## 2015-05-20 DIAGNOSIS — N888 Other specified noninflammatory disorders of cervix uteri: Secondary | ICD-10-CM | POA: Diagnosis not present

## 2015-05-20 DIAGNOSIS — D259 Leiomyoma of uterus, unspecified: Secondary | ICD-10-CM | POA: Insufficient documentation

## 2015-05-20 DIAGNOSIS — M5136 Other intervertebral disc degeneration, lumbar region: Secondary | ICD-10-CM | POA: Insufficient documentation

## 2015-05-20 DIAGNOSIS — E785 Hyperlipidemia, unspecified: Secondary | ICD-10-CM | POA: Insufficient documentation

## 2015-05-20 DIAGNOSIS — Z808 Family history of malignant neoplasm of other organs or systems: Secondary | ICD-10-CM | POA: Insufficient documentation

## 2015-05-20 DIAGNOSIS — M5416 Radiculopathy, lumbar region: Secondary | ICD-10-CM | POA: Diagnosis not present

## 2015-05-20 DIAGNOSIS — N72 Inflammatory disease of cervix uteri: Secondary | ICD-10-CM | POA: Insufficient documentation

## 2015-05-20 DIAGNOSIS — F419 Anxiety disorder, unspecified: Secondary | ICD-10-CM | POA: Diagnosis not present

## 2015-05-20 DIAGNOSIS — Z7951 Long term (current) use of inhaled steroids: Secondary | ICD-10-CM | POA: Diagnosis not present

## 2015-05-20 DIAGNOSIS — Z853 Personal history of malignant neoplasm of breast: Secondary | ICD-10-CM | POA: Insufficient documentation

## 2015-05-20 DIAGNOSIS — Z803 Family history of malignant neoplasm of breast: Secondary | ICD-10-CM | POA: Diagnosis not present

## 2015-05-20 DIAGNOSIS — G43909 Migraine, unspecified, not intractable, without status migrainosus: Secondary | ICD-10-CM | POA: Diagnosis not present

## 2015-05-20 DIAGNOSIS — F329 Major depressive disorder, single episode, unspecified: Secondary | ICD-10-CM | POA: Insufficient documentation

## 2015-05-20 DIAGNOSIS — Z79899 Other long term (current) drug therapy: Secondary | ICD-10-CM | POA: Insufficient documentation

## 2015-05-20 DIAGNOSIS — Z8249 Family history of ischemic heart disease and other diseases of the circulatory system: Secondary | ICD-10-CM | POA: Diagnosis not present

## 2015-05-20 DIAGNOSIS — J45909 Unspecified asthma, uncomplicated: Secondary | ICD-10-CM | POA: Insufficient documentation

## 2015-05-20 DIAGNOSIS — N838 Other noninflammatory disorders of ovary, fallopian tube and broad ligament: Secondary | ICD-10-CM | POA: Insufficient documentation

## 2015-05-20 DIAGNOSIS — N800 Endometriosis of uterus: Secondary | ICD-10-CM | POA: Diagnosis not present

## 2015-05-20 DIAGNOSIS — I10 Essential (primary) hypertension: Secondary | ICD-10-CM | POA: Insufficient documentation

## 2015-05-20 DIAGNOSIS — C541 Malignant neoplasm of endometrium: Secondary | ICD-10-CM | POA: Diagnosis not present

## 2015-05-20 DIAGNOSIS — Z7982 Long term (current) use of aspirin: Secondary | ICD-10-CM | POA: Diagnosis not present

## 2015-05-20 DIAGNOSIS — J449 Chronic obstructive pulmonary disease, unspecified: Secondary | ICD-10-CM | POA: Diagnosis not present

## 2015-05-20 DIAGNOSIS — Z9849 Cataract extraction status, unspecified eye: Secondary | ICD-10-CM | POA: Diagnosis not present

## 2015-05-20 DIAGNOSIS — F17200 Nicotine dependence, unspecified, uncomplicated: Secondary | ICD-10-CM | POA: Insufficient documentation

## 2015-05-20 DIAGNOSIS — E119 Type 2 diabetes mellitus without complications: Secondary | ICD-10-CM | POA: Diagnosis not present

## 2015-05-20 DIAGNOSIS — M7060 Trochanteric bursitis, unspecified hip: Secondary | ICD-10-CM | POA: Insufficient documentation

## 2015-05-20 DIAGNOSIS — Z801 Family history of malignant neoplasm of trachea, bronchus and lung: Secondary | ICD-10-CM | POA: Diagnosis not present

## 2015-05-20 HISTORY — PX: LAPAROSCOPIC BILATERAL SALPINGO OOPHERECTOMY: SHX5890

## 2015-05-20 HISTORY — PX: ROBOTIC ASSISTED TOTAL HYSTERECTOMY: SHX6085

## 2015-05-20 LAB — GLUCOSE, CAPILLARY
GLUCOSE-CAPILLARY: 127 mg/dL — AB (ref 65–99)
Glucose-Capillary: 123 mg/dL — ABNORMAL HIGH (ref 65–99)
Glucose-Capillary: 151 mg/dL — ABNORMAL HIGH (ref 65–99)
Glucose-Capillary: 157 mg/dL — ABNORMAL HIGH (ref 65–99)

## 2015-05-20 LAB — TYPE AND SCREEN
ABO/RH(D): A POS
Antibody Screen: NEGATIVE

## 2015-05-20 SURGERY — ROBOTIC ASSISTED TOTAL HYSTERECTOMY
Anesthesia: General | Wound class: Clean Contaminated

## 2015-05-20 MED ORDER — FENTANYL CITRATE (PF) 100 MCG/2ML IJ SOLN: 25.0000 ug | INTRAMUSCULAR | Status: DC | PRN

## 2015-05-20 MED ORDER — BUPIVACAINE HCL 0.5 % IJ SOLN
INTRAMUSCULAR | Status: DC | PRN
  Administered 2015-05-20: 15 mL

## 2015-05-20 MED ORDER — BUPIVACAINE HCL (PF) 0.5 % IJ SOLN
INTRAMUSCULAR | Status: AC
  Filled 2015-05-20: qty 30

## 2015-05-20 MED ORDER — SODIUM CHLORIDE 0.9 % IV SOLN
INTRAVENOUS | Status: DC | PRN
  Administered 2015-05-20: 08:00:00 via INTRAVENOUS

## 2015-05-20 MED ORDER — ONDANSETRON HCL 4 MG PO TABS
4.0000 mg | ORAL_TABLET | Freq: Four times a day (QID) | ORAL | Status: DC | PRN
Start: 2015-05-20 — End: 2015-05-21

## 2015-05-20 MED ORDER — DEXTROSE IN LACTATED RINGERS 5 % IV SOLN: INTRAVENOUS | Status: DC

## 2015-05-20 MED ORDER — SUGAMMADEX SODIUM 500 MG/5ML IV SOLN
INTRAVENOUS | Status: DC | PRN
  Administered 2015-05-20: 200 mg via INTRAVENOUS

## 2015-05-20 MED ORDER — PHENYLEPHRINE HCL 10 MG/ML IJ SOLN
INTRAMUSCULAR | Status: DC | PRN
  Administered 2015-05-20: 100 ug via INTRAVENOUS

## 2015-05-20 MED ORDER — ALBUTEROL SULFATE (2.5 MG/3ML) 0.083% IN NEBU: 2.5000 mg | INHALATION_SOLUTION | RESPIRATORY_TRACT | Status: DC | PRN

## 2015-05-20 MED ORDER — INDOCYANINE GREEN 25 MG IV SOLR
INTRAVENOUS | Status: AC
Start: 2015-05-20 — End: 2015-05-20
  Filled 2015-05-20: qty 25

## 2015-05-20 MED ORDER — FAMOTIDINE 20 MG PO TABS
ORAL_TABLET | ORAL | Status: AC
  Administered 2015-05-20: 20 mg
  Filled 2015-05-20: qty 1

## 2015-05-20 MED ORDER — LACTATED RINGERS IV SOLN
INTRAVENOUS | Status: DC
  Administered 2015-05-20: 22:00:00 via INTRAVENOUS

## 2015-05-20 MED ORDER — ONDANSETRON HCL 4 MG/2ML IJ SOLN: 4.0000 mg | Freq: Four times a day (QID) | INTRAMUSCULAR | Status: DC | PRN

## 2015-05-20 MED ORDER — LIDOCAINE HCL (CARDIAC) 20 MG/ML IV SOLN
INTRAVENOUS | Status: DC | PRN
  Administered 2015-05-20: 100 mg via INTRAVENOUS

## 2015-05-20 MED ORDER — OXYCODONE-ACETAMINOPHEN 5-325 MG PO TABS: 1.0000 | ORAL_TABLET | ORAL | Status: DC | PRN

## 2015-05-20 MED ORDER — DEXAMETHASONE SODIUM PHOSPHATE 4 MG/ML IJ SOLN
INTRAMUSCULAR | Status: DC | PRN
  Administered 2015-05-20: 5 mg via INTRAVENOUS

## 2015-05-20 MED ORDER — INDOCYANINE GREEN 25 MG IV SOLR
INTRAVENOUS | Status: DC | PRN
Start: 2015-05-20 — End: 2015-05-20
  Administered 2015-05-20: 4 mg

## 2015-05-20 MED ORDER — FENTANYL CITRATE (PF) 100 MCG/2ML IJ SOLN
INTRAMUSCULAR | Status: DC | PRN
  Administered 2015-05-20: 100 ug via INTRAVENOUS
  Administered 2015-05-20 (×3): 50 ug via INTRAVENOUS

## 2015-05-20 MED ORDER — HYDROCODONE-ACETAMINOPHEN 5-325 MG PO TABS
1.0000 | ORAL_TABLET | Freq: Two times a day (BID) | ORAL | Status: DC
  Administered 2015-05-20 – 2015-05-21 (×2): 1 via ORAL
  Filled 2015-05-20 (×2): qty 1

## 2015-05-20 MED ORDER — ESMOLOL HCL 10 MG/ML IV SOLN
INTRAVENOUS | Status: DC | PRN
  Administered 2015-05-20 (×2): 10 mg via INTRAVENOUS
  Administered 2015-05-20: 20 mg via INTRAVENOUS
  Administered 2015-05-20: 10 mg via INTRAVENOUS

## 2015-05-20 MED ORDER — OLANZAPINE 2.5 MG PO TABS
2.5000 mg | ORAL_TABLET | Freq: Every day | ORAL | Status: DC
  Administered 2015-05-20: 2.5 mg via ORAL
  Filled 2015-05-20 (×2): qty 1

## 2015-05-20 MED ORDER — SODIUM CHLORIDE 0.9 % IV SOLN
INTRAVENOUS | Status: DC
  Administered 2015-05-20 (×2): via INTRAVENOUS

## 2015-05-20 MED ORDER — ARIPIPRAZOLE 5 MG PO TABS
5.0000 mg | ORAL_TABLET | Freq: Every day | ORAL | Status: DC
  Administered 2015-05-20: 5 mg via ORAL
  Filled 2015-05-20 (×2): qty 1

## 2015-05-20 MED ORDER — ONDANSETRON HCL 4 MG/2ML IJ SOLN: 4.0000 mg | Freq: Once | INTRAMUSCULAR | Status: DC | PRN

## 2015-05-20 MED ORDER — SODIUM CHLORIDE 0.9 % IJ SOLN
INTRAMUSCULAR | Status: AC
  Filled 2015-05-20: qty 10

## 2015-05-20 MED ORDER — PANTOPRAZOLE SODIUM 40 MG PO TBEC
40.0000 mg | DELAYED_RELEASE_TABLET | Freq: Every day | ORAL | Status: DC
Start: 2015-05-20 — End: 2015-05-21
  Administered 2015-05-20: 40 mg via ORAL
  Filled 2015-05-20: qty 1

## 2015-05-20 MED ORDER — DOCUSATE SODIUM 100 MG PO CAPS
100.0000 mg | ORAL_CAPSULE | Freq: Two times a day (BID) | ORAL | Status: DC
  Filled 2015-05-20 (×2): qty 1

## 2015-05-20 MED ORDER — GABAPENTIN 300 MG PO CAPS
300.0000 mg | ORAL_CAPSULE | Freq: Two times a day (BID) | ORAL | Status: DC
  Administered 2015-05-20 – 2015-05-21 (×2): 300 mg via ORAL
  Filled 2015-05-20 (×4): qty 1

## 2015-05-20 MED ORDER — METOPROLOL TARTRATE 1 MG/ML IV SOLN
INTRAVENOUS | Status: DC | PRN
  Administered 2015-05-20 (×5): 1 mg via INTRAVENOUS

## 2015-05-20 MED ORDER — CEFAZOLIN SODIUM-DEXTROSE 2-3 GM-% IV SOLR
2.0000 g | INTRAVENOUS | Status: AC
  Administered 2015-05-20: 2 g via INTRAVENOUS

## 2015-05-20 MED ORDER — PROPOFOL 10 MG/ML IV BOLUS
INTRAVENOUS | Status: DC | PRN
  Administered 2015-05-20: 150 mg via INTRAVENOUS

## 2015-05-20 MED ORDER — LACTATED RINGERS IV SOLN: INTRAVENOUS | Status: DC

## 2015-05-20 MED ORDER — LINAGLIPTIN 5 MG PO TABS
5.0000 mg | ORAL_TABLET | Freq: Every day | ORAL | Status: DC
  Administered 2015-05-21: 5 mg via ORAL
  Filled 2015-05-20 (×3): qty 1

## 2015-05-20 MED ORDER — MORPHINE SULFATE 2 MG/ML IJ SOLN
1.0000 mg | INTRAMUSCULAR | Status: DC | PRN
  Administered 2015-05-20: 2 mg via INTRAVENOUS
  Filled 2015-05-20: qty 1

## 2015-05-20 MED ORDER — ACETAMINOPHEN 10 MG/ML IV SOLN
INTRAVENOUS | Status: AC
Start: 2015-05-20 — End: 2015-05-20
  Filled 2015-05-20: qty 100

## 2015-05-20 MED ORDER — ONDANSETRON HCL 4 MG/2ML IJ SOLN
INTRAMUSCULAR | Status: DC | PRN
  Administered 2015-05-20: 4 mg via INTRAVENOUS

## 2015-05-20 MED ORDER — ACETAMINOPHEN 10 MG/ML IV SOLN
INTRAVENOUS | Status: DC | PRN
  Administered 2015-05-20: 1000 mg via INTRAVENOUS

## 2015-05-20 MED ORDER — ROCURONIUM BROMIDE 100 MG/10ML IV SOLN
INTRAVENOUS | Status: DC | PRN
  Administered 2015-05-20: 10 mg via INTRAVENOUS
  Administered 2015-05-20: 50 mg via INTRAVENOUS
  Administered 2015-05-20: 10 mg via INTRAVENOUS

## 2015-05-20 SURGICAL SUPPLY — 67 items
ANCHOR TIS RET SYS 1550ML (BAG) ×4 IMPLANT
BAG URO DRAIN 2000ML W/SPOUT (MISCELLANEOUS) ×4 IMPLANT
BLADE SURG 11 STRL SS SAFETY (MISCELLANEOUS) ×4 IMPLANT
CANISTER SUCT 1200ML W/VALVE (MISCELLANEOUS) ×4 IMPLANT
CATH FOLEY 2WAY  5CC 16FR (CATHETERS) ×2
CATH TRAY 16F METER LATEX (MISCELLANEOUS) ×4 IMPLANT
CATH URTH 16FR FL 2W BLN LF (CATHETERS) ×2 IMPLANT
CHLORAPREP W/TINT 26ML (MISCELLANEOUS) ×4 IMPLANT
CNTNR SPEC 2.5X3XGRAD LEK (MISCELLANEOUS) ×2
CONT SPEC 4OZ STER OR WHT (MISCELLANEOUS) ×2
CONTAINER SPEC 2.5X3XGRAD LEK (MISCELLANEOUS) ×2 IMPLANT
CORD BIP STRL DISP 12FT (MISCELLANEOUS) ×4 IMPLANT
COVER LIGHT HANDLE STERIS (MISCELLANEOUS) ×8 IMPLANT
COVER TIP SHEARS 8 DVNC (MISCELLANEOUS) ×2 IMPLANT
COVER TIP SHEARS 8MM DA VINCI (MISCELLANEOUS) ×2
DECANTER SPIKE VIAL GLASS SM (MISCELLANEOUS) ×4 IMPLANT
DEFOGGER SCOPE WARMER CLEARIFY (MISCELLANEOUS) ×4 IMPLANT
DRAPE LAPAROTOMY 100X77 ABD (DRAPES) ×4 IMPLANT
DRAPE UNDER BUTTOCK W/FLU (DRAPES) ×4 IMPLANT
DRESSING SURGICEL FIBRLLR 1X2 (HEMOSTASIS) ×2 IMPLANT
DRSG SURGICEL FIBRILLAR 1X2 (HEMOSTASIS) ×4
DRSG TELFA 3X8 NADH (GAUZE/BANDAGES/DRESSINGS) ×4 IMPLANT
ELECT BLADE 6 FLAT ULTRCLN (ELECTRODE) ×4 IMPLANT
ELECT CAUTERY BLADE 6.4 (BLADE) IMPLANT
FILTER LAP SMOKE EVAC STRL (MISCELLANEOUS) ×4 IMPLANT
GAUZE SPONGE 4X4 12PLY STRL (GAUZE/BANDAGES/DRESSINGS) ×4 IMPLANT
GLOVE BIO SURGEON STRL SZ 6.5 (GLOVE) ×15 IMPLANT
GLOVE BIO SURGEON STRL SZ8 (GLOVE) ×8 IMPLANT
GLOVE BIO SURGEONS STRL SZ 6.5 (GLOVE) ×5
GLOVE INDICATOR 7.0 STRL GRN (GLOVE) ×12 IMPLANT
GOWN STRL REUS W/ TWL LRG LVL3 (GOWN DISPOSABLE) ×10 IMPLANT
GOWN STRL REUS W/TWL LRG LVL3 (GOWN DISPOSABLE) ×10
GRASPER SUT TROCAR 14GX15 (MISCELLANEOUS) ×4 IMPLANT
KIT RM TURNOVER CYSTO AR (KITS) ×4 IMPLANT
LABEL OR SOLS (LABEL) ×4 IMPLANT
LIQUID BAND (GAUZE/BANDAGES/DRESSINGS) ×4 IMPLANT
MANIPULATOR VCARE STD CRV RETR (MISCELLANEOUS) ×4 IMPLANT
NDL INSUFF ACCESS 14 VERSASTEP (NEEDLE) ×4 IMPLANT
NS IRRIG 1000ML POUR BTL (IV SOLUTION) ×4 IMPLANT
OCCLUDER COLPOPNEUMO (BALLOONS) ×4 IMPLANT
PACK BASIN MAJOR ARMC (MISCELLANEOUS) ×4 IMPLANT
PAD GROUND ADULT SPLIT (MISCELLANEOUS) ×4 IMPLANT
PAD OB MATERNITY 4.3X12.25 (PERSONAL CARE ITEMS) ×4 IMPLANT
PENCIL ELECTRO HAND CTR (MISCELLANEOUS) ×4 IMPLANT
SLEEVE VERSASTEP EXPAND ONEST (MISCELLANEOUS) ×12 IMPLANT
SOLUTION ELECTROLUBE (MISCELLANEOUS) ×4 IMPLANT
SPONGE LAP 18X18 5 PK (GAUZE/BANDAGES/DRESSINGS) ×4 IMPLANT
STAPLER SKIN PROX 35W (STAPLE) ×4 IMPLANT
SUT DVC VLOC 180 0 12IN GS21 (SUTURE) ×4
SUT MAXON ABS #0 GS21 30IN (SUTURE) IMPLANT
SUT MNCRL 4-0 (SUTURE) ×4
SUT MNCRL 4-018XMFL (SUTURE) ×4
SUT PDS AB 1 TP1 96 (SUTURE) IMPLANT
SUT VIC AB 0 CT1 27 (SUTURE)
SUT VIC AB 0 CT1 27XCR 8 STRN (SUTURE) IMPLANT
SUT VIC AB 0 CT1 36 (SUTURE) ×4 IMPLANT
SUT VICRYL 0 AB UR-6 (SUTURE) ×8 IMPLANT
SUT VICRYL AB 3-0 FS1 BRD 27IN (SUTURE) ×4 IMPLANT
SUTURE DVC VLC 180 0 12IN GS21 (SUTURE) ×2 IMPLANT
SUTURE MNCRL 4-018XMF (SUTURE) ×4 IMPLANT
SYR 3ML LL SCALE MARK (SYRINGE) ×8 IMPLANT
SYR 50ML LL SCALE MARK (SYRINGE) ×4 IMPLANT
SYR BULB IRRIG 60ML STRL (SYRINGE) ×4 IMPLANT
TROCAR 130MM GELPORT  DAV (MISCELLANEOUS) ×4 IMPLANT
TROCAR DISP BLADELESS 8 DVNC (TROCAR) ×2 IMPLANT
TROCAR DISP BLADELESS 8MM (TROCAR) ×2
TROCAR VERSASTEP PLUS 12MM (TROCAR) ×4 IMPLANT

## 2015-05-20 NOTE — Anesthesia Preprocedure Evaluation (Addendum)
Anesthesia Evaluation  Patient identified by MRN, date of birth, ID band Patient awake    Reviewed: Allergy & Precautions, NPO status   History of Anesthesia Complications Negative for: history of anesthetic complications  Airway Mallampati: III       Dental  (+) Edentulous Upper, Edentulous Lower   Pulmonary asthma , COPDCurrent Smoker,  + rhonchi   + decreased breath sounds      Cardiovascular hypertension, Rate:Normal     Neuro/Psych CVA    GI/Hepatic Neg liver ROS, GERD-  ,  Endo/Other  diabetes, Type 2  Renal/GU      Musculoskeletal  (+) Arthritis -,   Abdominal Normal abdominal exam  (+)   Peds negative pediatric ROS (+)  Hematology negative hematology ROS (+)   Anesthesia Other Findings   Reproductive/Obstetrics negative OB ROS                           Anesthesia Physical Anesthesia Plan  ASA: III  Anesthesia Plan: General   Post-op Pain Management:    Induction: Intravenous  Airway Management Planned: Oral ETT  Additional Equipment:   Intra-op Plan:   Post-operative Plan: Extubation in OR  Informed Consent: I have reviewed the patients History and Physical, chart, labs and discussed the procedure including the risks, benefits and alternatives for the proposed anesthesia with the patient or authorized representative who has indicated his/her understanding and acceptance.     Plan Discussed with: CRNA  Anesthesia Plan Comments:         Anesthesia Quick Evaluation

## 2015-05-20 NOTE — Progress Notes (Signed)
Pt continues decreased LOC, arouses to touch. Dr Laureen Abrahams to bedside and aware of LOC.

## 2015-05-20 NOTE — Anesthesia Postprocedure Evaluation (Signed)
  Anesthesia Post-op Note  Patient: Michelle Hays  Procedure(s) Performed: Procedure(s): ROBOTIC ASSISTED TOTAL HYSTERECTOMY (N/A) LAPAROSCOPIC BILATERAL SALPINGO OOPHORECTOMY (Bilateral)  Anesthesia type:General  Patient location: PACU  Post pain: Pain level controlled  Post assessment: Post-op Vital signs reviewed, Patient's Cardiovascular Status Stable, Respiratory Function Stable, Patent Airway and No signs of Nausea or vomiting  Post vital signs: Reviewed and stable  Last Vitals:  Filed Vitals:   05/20/15 1353  BP: 128/70  Pulse: 92  Temp: 36.3 C  Resp: 16    Level of consciousness: awake, alert  and patient cooperative  Complications: No apparent anesthesia complications

## 2015-05-20 NOTE — Transfer of Care (Signed)
Immediate Anesthesia Transfer of Care Note  Patient: Michelle Hays  Procedure(s) Performed: Procedure(s): ROBOTIC ASSISTED TOTAL HYSTERECTOMY (N/A) LAPAROSCOPIC BILATERAL SALPINGO OOPHORECTOMY (Bilateral)  Patient Location: PACU  Anesthesia Type:General  Level of Consciousness: awake  Airway & Oxygen Therapy: Patient Spontanous Breathing and Patient connected to face mask oxygen  Post-op Assessment: Report given to RN and Post -op Vital signs reviewed and stable  Post vital signs: Reviewed and stable  Last Vitals:  Filed Vitals:   05/20/15 1049  BP: 132/62  Pulse: 86  Temp: 37 C  Resp: 18    Complications: No apparent anesthesia complications

## 2015-05-20 NOTE — Op Note (Signed)
Operative Note   05/20/2015 10:45 AM  PRE-OP DIAGNOSIS: ENDOMETRIAL CANCER    POST-OP DIAGNOSIS: Stage 1A ENDOMETRIAL CANCER, pending final pathology   SURGEON: Surgeon(s) and Role:    * Angeles Gaetana Michaelis, MD - Primary   ASSISTANT:    Gwen Her Schermerhorn, MD - Assisting  ANESTHESIA: General  PROCEDURE: Procedure(s): ROBOTIC ASSISTED TOTAL HYSTERECTOMY LAPAROSCOPIC BILATERAL SALPINGO OOPHORECTOMY  SENTINEL LYMPH NODE INJECTION AND MAPPING BILATERAL LYMPH NODE SAMPLING  ESTIMATED BLOOD LOSS: less than 50 mL  DRAINS: NONE   TOTAL IV FLUIDS: 2000 CC NS  SPECIMENS:  Uterus, cervix, bilateral tubes and ovaries, bilateral sentinel lymph nodes (obturator nodes), and washings  COMPLICATIONS: None  DISPOSITION: PACU - hemodynamically stable.  CONDITION: stable  INDICATIONS: Grade 2 endoemetrial cancer  FINDINGS: Exam under anesthesia revealed 8-10 week sized uterus with leiomyoma. Parametria smooth and no adnexal masses. Intraoperative findings revealed normal upper abdomen, liver, omentum, and bowel. There was no evidence of adenopathy. The uterus revealed several <= 3 cm leiomyoma. The adnexa were negative. Sentinel lymph nodes were mapped bilaterally. On the right there was a small 1 cm node located in the mid-obturator node. On the left the channel entered a node in the mid to proximal lateral obturator space.  PROCEDURE IN DETAIL: After informed consent was obtained, the patient was taken to the operating room where anesthesia was obtained without difficulty. The patient was positioned in the dorsal lithotomy position in [place] and her arms were carefully tucked at her sides and the usual precautions were taken.  She was prepped and draped in normal sterile fashion.  Time-out was performed and a Foley catheter was placed into the bladder and the cervix was infiltrated with 4 ml of methylene blue at 3 an 9 o'clock both superficial and deep injections. The uterus  sounded to 7 cm. A standard VCare uterine manipulator was then placed in the uterus without incident.    An open Hasson technique was used to place an supraaumbilical 56-LS baloon trocar under direct visualization. The laparoscope was introduced and CO2 gas was infused for pneumoperitoneum to a pressure of 12 and eventually to 15 mm Hg. We made sure the patient could tolerate the insufflation before proceeding given her pulmonary disease. The patient was placed in Trendelenburg.  We did need to reduce the pneumoperitoneum at that point, ranging between 10-12 mm, due to her peak pressures. Right and left lateral 8-mm ports and a 5-12 mm LUQ port were placed under direct visualization of the laparoscope using an EndoStep technique.  The bowel was displaced up into the upper abdomen. At this point, a tear was noted in the [place]. Fibrillar was placed on this tear to optimize hemostasis. Cytologic washings were obtained.  Round ligaments were divided on each side with the EndoShears and the retroperitoneal space was opened bilaterally.  The ureters were identified and preserved.  At this point the retroperitoneal spaces were developed and the lymphatic channels were mapped to each side.  The sentinel node on the right side was then identified, skeletonized and removed taking care not to injure the  obturator nerve, the ureter or the pelvic vasculature.  Similarly on the left side, the retroperitoneal spaces were developed, the lymphatic channels mapped to identify the sentinel node. This node was removed after the hysterectomy. The infundibulopelvic ligaments were skeletonized, sealed and divided with the LigaSure device.  A bladder flap was created and the bladder was dissected down off the lower uterine segment and cervix using endoshears  and electrocautery.  The uterine arteries were skeletonized bilaterally, sealed and divided with the LigaSure device.  A colpotomy was performed circumferentially along the  V-Care ring with electrocautery and the cervix was incised from the vagina. Because of the narrow vaginal cylinder and presence of leiomyoma, the specimen was placed in an endocatch bag, and removed through the vagina with care to avoid tumor spill.  A pneumo balloon was placed in the vagina and the vaginal cuff was then closed in a running continuous fashion using the EndoStitch technique with 0 V-Lock suture with careful attention to include the vaginal cuff angles and the vaginal mucosa within the closure.   Attention was turned to the left sentinel node. This was removed with care to preserve the obturator nerve, the ureter and the pelvic vasculature. In the interim the uterus was sent for intraoperative pathologic evaluation revealed favorable risk criteria (small tumor - no size given, without invasion).   Hemostasis was observed. The intraperitoneal pressure was dropped, and all planes of dissection, vascular pedicles, the mesenteric tear, and the vaginal cuff were found to be hemostatic. Fibrillar was placed on the vaginal cuff.   The LUQ trocar was removed and the fascia was closed with 0 Vicryl suture using the Endoclose technique. The lateral trocars were removed under visualization.   Before the umbilical trocar was removed the CO2 gas was released.  The fascia there was closed with 0 Vicryl suture in interrupted technique.   The skin incision at the umbilicus was closed with subcuticular stitch and reinforced with dermatologic glue.  The remaining skin incisions were closed with dermatologic glue.  The vaginal cuff was reinspected and noted to be hemostatic and intact. The patient tolerated the procedure well.  Sponge, lap and needle counts were correct x2.  The patient was taken to recovery room in excellent condition.  Antibiotics:Given 1st or 2nd generation cephalosporin, Antibiotics given within 1 hour of the start of the procedure, Antibiotics ordered to be discontinued within 24 hours post  procedure.   VTE prophylaxis: was ordered perioperatively.  Wound: clean contaminated  Gillis Ends, MD

## 2015-05-20 NOTE — Progress Notes (Signed)
Patient ID: Michelle Hays, female   DOB: 1946-04-18, 69 y.o.   MRN: [id number] DOS #1 Pain in good control  O: VSS  Abd soft , appropriate  TTP OU adequate A: stable  P: increase IV rate to 125 / hr  Anticipate d/c in am

## 2015-05-20 NOTE — Anesthesia Procedure Notes (Signed)
Procedure Name: Intubation Date/Time: 05/20/2015 7:37 AM Performed by: Aline Brochure Pre-anesthesia Checklist: Patient identified, Emergency Drugs available, Suction available and Patient being monitored Patient Re-evaluated:Patient Re-evaluated prior to inductionOxygen Delivery Method: Circle system utilized Preoxygenation: Pre-oxygenation with 100% oxygen Intubation Type: IV induction Ventilation: Mask ventilation without difficulty and Oral airway inserted - appropriate to patient size Laryngoscope Size: Mac and 3 Grade View: Grade I Tube type: Oral Tube size: 7.0 mm Number of attempts: 1 Airway Equipment and Method: Patient positioned with wedge pillow and Stylet Placement Confirmation: ETT inserted through vocal cords under direct vision,  positive ETCO2 and breath sounds checked- equal and bilateral Secured at: 22 cm Tube secured with: Tape Dental Injury: Teeth and Oropharynx as per pre-operative assessment

## 2015-05-21 DIAGNOSIS — C541 Malignant neoplasm of endometrium: Secondary | ICD-10-CM | POA: Diagnosis not present

## 2015-05-21 LAB — CBC
HCT: 43.6 % (ref 35.0–47.0)
Hemoglobin: 13.9 g/dL (ref 12.0–16.0)
MCH: 27.8 pg (ref 26.0–34.0)
MCHC: 31.9 g/dL — AB (ref 32.0–36.0)
MCV: 87.1 fL (ref 80.0–100.0)
Platelets: 284 10*3/uL (ref 150–440)
RBC: 5 MIL/uL (ref 3.80–5.20)
RDW: 14 % (ref 11.5–14.5)
WBC: 15.8 10*3/uL — AB (ref 3.6–11.0)

## 2015-05-21 LAB — BASIC METABOLIC PANEL
Anion gap: 7 (ref 5–15)
BUN: 7 mg/dL (ref 6–20)
CHLORIDE: 106 mmol/L (ref 101–111)
CO2: 30 mmol/L (ref 22–32)
CREATININE: 0.81 mg/dL (ref 0.44–1.00)
Calcium: 7.3 mg/dL — ABNORMAL LOW (ref 8.9–10.3)
GFR calc non Af Amer: >60 mL/min (ref >60)
Glucose, Bld: 102 mg/dL — ABNORMAL HIGH (ref 65–99)
Potassium: 3.6 mmol/L (ref 3.5–5.1)
SODIUM: 143 mmol/L (ref 135–145)

## 2015-05-21 LAB — GLUCOSE, CAPILLARY
Glucose-Capillary: 115 mg/dL — ABNORMAL HIGH (ref 65–99)
Glucose-Capillary: 89 mg/dL (ref 65–99)
Glucose-Capillary: 99 mg/dL (ref 65–99)

## 2015-05-21 MED ORDER — DOCUSATE SODIUM 100 MG PO CAPS: 100.0000 mg | ORAL_CAPSULE | Freq: Two times a day (BID) | ORAL | Status: DC

## 2015-05-21 MED ORDER — ONDANSETRON HCL 4 MG PO TABS: 4.0000 mg | ORAL_TABLET | Freq: Four times a day (QID) | ORAL | Status: DC | PRN

## 2015-05-21 MED ORDER — NAPROXEN 500 MG PO TABS: 500.0000 mg | ORAL_TABLET | Freq: Two times a day (BID) | ORAL | Status: DC

## 2015-05-21 MED ORDER — OXYCODONE-ACETAMINOPHEN 5-325 MG PO TABS: 1.0000 | ORAL_TABLET | ORAL | Status: DC | PRN

## 2015-05-21 NOTE — Progress Notes (Signed)
Pt discharged home.  Discharge instructions, prescriptions and follow up appointment given to and reviewed with pt.  Pt verbalized understanding.  Escorted by auxillary. 

## 2015-05-21 NOTE — Discharge Instructions (Signed)
Signs and Symptoms to Report Call our office at [phone] if you have any of the following.   Fever over 100.4 degrees or higher  Severe stomach pain not relieved with pain medications  Bright red bleeding thats heavier than a period that does not slow with rest  To go the bathroom a lot (frequency), you cant hold your urine (urgency), or it hurts when you empty your bladder (urinate)  Chest pain  Shortness of breath  Pain in the calves of your legs  Severe nausea and vomiting not relieved with anti-nausea medications  Signs of infection around your wounds, such as redness, hot to touch, swelling, green/yellow drainage (like pus), bad smelling discharge  Any concerns  What You Can Expect after Surgery  You may see some pink tinged, bloody fluid and bruising around the wound. This is normal.  You may notice shoulder and neck pain. This is caused by the gas used during surgery to expand your abdomen so your surgeon could get to the uterus easier.  You may have a sore throat because of the tube in your mouth during general anesthesia. This will go away in 2 to 3 days.  You may have some stomach cramps.  You may notice spotting on your panties.  You may have pain around the incision sites.   Activities after Your Discharge Follow these guidelines to help speed your recovery at home:  Do the coughing and deep breathing as you did in the hospital for 2 weeks. Use the small blue breathing device, called the incentive spirometer for 2 weeks.  Dont drive if you are in pain or taking narcotic pain medicine. You may drive when you can safely slam on the brakes, turn the wheel forcefully, and rotate your torso comfortably. This is typically 1-2 weeks. Practice in a parking lot or side street prior to attempting to drive regularly.   Ask others to help with household chores for 4 weeks.  Do not lift anything heavier that 10 pounds for 4-6 weeks. This includes pets,  children, and groceries.  Dont do strenuous activities, exercises, or sports like vacuuming, tennis, squash, etc. until your doctor says it is safe to do so. ---If you had a hysterectomy (abdominal, laparoscopic, or vaginal) do not have intercourse for 8-10 weeks.   Walk as you feel able. Rest often since it may take two or three weeks for your energy level to return to normal.   You may climb stairs  Avoid constipation:   -Eat fruits, vegetables, and whole grains. Eat small meals as your appetite will take time to return to normal.   -Drink 6 to 8 glasses of water each day unless your doctor has told you to limit your fluids.   -Use a laxative or stool softener as needed if constipation becomes a problem. You may take Miralax, metamucil, Citrucil, Colace, Senekot, FiberCon, etc. If this does not relieve the constipation, try two tablespoons of Milk Of Magnesia every 8 hours until your bowels move.   You may shower. Gently wash the wounds with a mild soap and water. Pat dry.  Do not get in a hot tub, swimming pool, etc. until your doctor agrees.  Do not use lotions, oils, powders on the wounds.  Do not douche, use tampons, or have sex until your doctor says it is okay.  Take your pain medicine when you need it. The medicine may not work as well if the pain is bad.  Take the medicines you were  taking before surgery. Other medications you will need are pain medications (Norco or Percocet) and nausea medications (Zofran).    Total Laparoscopic Hysterectomy, Care After Refer to this sheet in the next few weeks. These instructions provide you with information on caring for yourself after your procedure. Your health care provider may also give you more specific instructions. Your treatment has been planned according to current medical practices, but problems sometimes occur. Call your health care provider if you have any problems or questions after your procedure. WHAT TO EXPECT AFTER THE  PROCEDURE  Pain and bruising at the incision sites. You will be given pain medicine to control it.  Menopausal symptoms such as hot flashes, night sweats, and insomnia if your ovaries were removed.  Sore throat from the breathing tube that was inserted during surgery. HOME CARE INSTRUCTIONS  Only take over-the-counter or prescription medicines for pain, discomfort, or fever as directed by your health care provider.   Do not take aspirin. It can cause bleeding.   Do not drive when taking pain medicine.   Follow your health care provider's advice regarding diet, exercise, lifting, driving, and general activities.   Resume your usual diet as directed and allowed.   Get plenty of rest and sleep.   Do not douche, use tampons, or have sexual intercourse for at least 6 weeks, or until your health care provider gives you permission.   Change your bandages (dressings) as directed by your health care provider.   Monitor your temperature and notify your health care provider of a fever.   Take showers instead of baths for 2-3 weeks.   Do not drink alcohol until your health care provider gives you permission.   If you develop constipation, you may take a mild laxative with your health care provider's permission. Bran foods may help with constipation problems. Drinking enough fluids to keep your urine clear or pale yellow may help as well.   Try to have someone home with you for 1-2 weeks to help around the house.   Keep all of your follow-up appointments as directed by your health care provider.  SEEK MEDICAL CARE IF:  You have swelling, redness, or increasing pain around your incision sites.   You have pus coming from your incision.   You notice a bad smell coming from your incision.   Your incision breaks open.   You feel dizzy or lightheaded.   You have pain or bleeding when you urinate.   You have persistent diarrhea.   You have persistent nausea and  vomiting.   You have abnormal vaginal discharge.   You have a rash.   You have any type of abnormal reaction or develop an allergy to your medicine.   You have poor pain control with your prescribed medicine.  SEEK IMMEDIATE MEDICAL CARE IF:  You have chest pain or shortness of breath.  You have severe abdominal pain that is not relieved with pain medicine.  You have pain or swelling in your legs. MAKE SURE YOU:  Understand these instructions.  Will watch your condition.  Will get help right away if you are not doing well or get worse. Document Released: 08/28/2013 Document Revised: 11/12/2013 Document Reviewed: 08/28/2013 [facility] Patient Information 2015 [place], [place]. This information is not intended to replace advice given to you by your health care provider. Make sure you discuss any questions you have with your health care provider.

## 2015-05-21 NOTE — Discharge Summary (Signed)
Physician Discharge Summary  Patient ID: Michelle Hays MRN: [id number] DOB/AGE: December 09, 1945 69 y.o.  Admit date: 05/20/2015 Discharge date: 05/21/2015  Admission Diagnoses:  Discharge Diagnoses:  Active Problems:   Post-operative state   Discharged Condition: good  Hospital Course: pt underwent an uncomplicated robotic TLH and BSO and lymph node dissection . Post-op pt did well with good urine output and nl vitals . Pain on d/c was in good control    Consults: None  Significant Diagnostic Studies:   Treatments: IV hydration  Discharge Exam: Blood pressure 131/83, pulse 93, temperature 97.8 F (36.6 C), temperature source Oral, resp. rate 16, height 5' 9.5" (1.765 m), weight 173 lb (78.472 kg), SpO2 97 %. General appearance: alert and cooperative Head: Normocephalic, without obvious abnormality Resp: clear to auscultation bilaterally Cardio: regular rate and rhythm, S1, S2 normal, no murmur, click, rub or gallop GI: appropriate TTP , non distended  Pelvic: cervix normal in appearance, external genitalia normal, no adnexal masses or tenderness, no cervical motion tenderness, rectovaginal septum normal, uterus normal size, shape, and consistency, vagina normal without discharge and no blood on perineum   Disposition:   Discharge Instructions    Call MD for:  difficulty breathing, headache or visual disturbances    Complete by:  As directed      Call MD for:  extreme fatigue    Complete by:  As directed      Call MD for:  hives    Complete by:  As directed      Call MD for:  persistant dizziness or light-headedness    Complete by:  As directed      Call MD for:  persistant nausea and vomiting    Complete by:  As directed      Call MD for:  redness, tenderness, or signs of infection (pain, swelling, redness, odor or green/yellow discharge around incision site)    Complete by:  As directed      Call MD for:  severe uncontrolled pain    Complete by:  As directed      Call MD  for:  temperature >100.4    Complete by:  As directed      Diet - low sodium heart healthy    Complete by:  As directed      Increase activity slowly    Complete by:  As directed      Lifting restrictions    Complete by:  As directed   Lift nothing greater than 15-20 for 4 weeks     Sexual Activity Restrictions    Complete by:  As directed   No sex 4 weeks            Medication List    STOP taking these medications        HYDROcodone-acetaminophen 5-325 MG per tablet  Commonly known as:  NORCO/VICODIN      TAKE these medications        albuterol 108 (90 BASE) MCG/ACT inhaler  Commonly known as:  PROVENTIL HFA;VENTOLIN HFA  Inhale 2 puffs into the lungs every 4 (four) hours as needed for wheezing. For wheezing     ARIPiprazole 5 MG tablet  Commonly known as:  ABILIFY  Take 5 mg by mouth daily.     aspirin EC 81 MG tablet  Take 1 tablet by mouth daily.     atorvastatin 40 MG tablet  Commonly known as:  LIPITOR  Take 40 mg by mouth daily.     docusate  sodium 100 MG capsule  Commonly known as:  COLACE  Take 1 capsule (100 mg total) by mouth 2 (two) times daily.     fluconazole 150 MG tablet  Commonly known as:  DIFLUCAN  Take 150 mg by mouth at bedtime.     gabapentin 300 MG capsule  Commonly known as:  NEURONTIN  Take 300 mg by mouth 2 (two) times daily.     naproxen 500 MG tablet  Commonly known as:  NAPROSYN  Take 1 tablet (500 mg total) by mouth 2 (two) times daily with a meal.     OLANZapine 2.5 MG tablet  Commonly known as:  ZYPREXA  Take 2.5 mg by mouth daily.     omeprazole 20 MG capsule  Commonly known as:  PRILOSEC  Take 20 mg by mouth 2 (two) times daily.     ondansetron 4 MG tablet  Commonly known as:  ZOFRAN  Take 1 tablet (4 mg total) by mouth every 6 (six) hours as needed for nausea.     oxyCODONE-acetaminophen 5-325 MG per tablet  Commonly known as:  PERCOCET/ROXICET  Take 1-2 tablets by mouth every 4 (four) hours as needed (moderate  to severe pain (when tolerating fluids)).     sitaGLIPtin 100 MG tablet  Commonly known as:  JANUVIA  Take 100 mg by mouth daily.           Follow-up Information    Follow up with Gillis Ends, MD In 5 weeks.   Specialty:  Obstetrics and Gynecology   Why:  For wound re-check   Contact information:   [facility] [address] [id number] [phone]       Signed: Laverta Baltimore 05/21/2015, 7:35 AM

## 2015-05-26 LAB — SURGICAL PATHOLOGY

## 2015-05-26 LAB — CYTOLOGY - NON PAP

## 2015-05-31 ENCOUNTER — Telehealth: Payer: Self-pay | Admitting: Obstetrics and Gynecology

## 2015-05-31 NOTE — Telephone Encounter (Signed)
I reviewed final pathology results. I answered the patient's questions accordingly. She is doing well. She will follow up in clinic; no further treatment is required.   DIAGNOSIS:  A. 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  - CERVIX SHOWING CHRONIC CERVICITIS AND NABOTHIAN CYSTS.  - ADENOMYOSIS OF THE MYOMETRIUM.  - LEIOMYOMATA WITH FOCAL HYALINIZATION AND CALCIFICATION, UP TO 2.5 CM  WITHOUT NECROSIS, ATYPIA OR INCREASED MITOSES.  - BILATERAL OVARIES SHOWING MILD STROMAL HYPERPLASIA.  - PARATUBAL CYSTS OF BILATERAL FALLOPIAN TUBES.  - SEE CANCER CASE SUMMARY BELOW.   B. SENTINEL LYMPH NODE, RIGHT OBTURATOR; EXCISION:  - NO TUMOR SEEN IN TWO LYMPH NODES.   C. SENTINEL LYMPH NODE, LEFT OBTURATOR; EXCISION:  - FIBROVASCULAR AND ADIPOSE TISSUE.   WASHINGS: Negative

## 2015-06-17 ENCOUNTER — Inpatient Hospital Stay: Payer: Medicare PPO

## 2015-06-24 ENCOUNTER — Inpatient Hospital Stay: Payer: Medicare PPO | Attending: Obstetrics and Gynecology | Admitting: Obstetrics and Gynecology

## 2015-06-24 VITALS — BP 123/77 | HR 85 | Temp 98.1°F | Wt 165.7 lb

## 2015-06-24 DIAGNOSIS — C541 Malignant neoplasm of endometrium: Secondary | ICD-10-CM

## 2015-06-24 NOTE — Progress Notes (Signed)
Gynecologic Oncology Consult Note  Referring physician: Dr. Laverta Baltimore M.D.  Chief Complaint: Endometrial cancer post-op.   Subjective:  LADENE ALLOCCA is a 69 y.o. woman who presents today for post op visit after surgery for endometrial cancer. No significant complaints.  Oncology Treatment History: The patient presented to Dr. Ouida Sills in consultation from Dr. [name] for abdominal cramping and an ultrasound demonstrating a thickened endometrial stripe measuring 12 mm. Dr. Ouida Sills performed Pap smear as well as endometrial biopsy on 03/16/2015. Her uterus sounded to 7 cm. The Pap smear demonstrated atypical squamous cells of undetermined significance. She has no history of prior abnormal Paps per her report. 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  03/24/2015 CT scan abdomen and pelvis IMPRESSION: Known endometrial cancer is not evident on CT. Multiple calcified uterine fibroids. No findings specific for metastatic disease.  She has multiple significant medical comorbidities specifically COPD and a greater than 100 pack year tobacco history. She has an albuterol inhaler and has occasional flares. When she has a flare she has an albuterol treatment; she has never required intubation. She tolerated anesthesia well when she had her cholecystectomy.  05/20/15  ROBOTIC ASSISTED TOTAL HYSTERECTOMY LAPAROSCOPIC BILATERAL SALPINGO OOPHORECTOMY, SENTINEL LYMPH NODE INJECTION AND MAPPING, BILATERAL LYMPH NODE SAMPLING  A. 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 GRADE2, 0.3 cm tumor. - CERVIX SHOWING CHRONIC CERVICITIS AND NABOTHIAN CYSTS.  - ADENOMYOSIS OF THE MYOMETRIUM.  - LEIOMYOMATA WITH FOCAL HYALINIZATION AND CALCIFICATION, UP TO 2.5 CM  WITHOUT NECROSIS,  ATYPIA OR INCREASED MITOSES.  - BILATERAL OVARIES SHOWING MILD STROMAL HYPERPLASIA.  - PARATUBAL CYSTS OF BILATERAL FALLOPIAN TUBES.  - SEE CANCER CASE SUMMARY BELOW.   B. SENTINEL LYMPH NODE, RIGHT OBTURATOR; EXCISION:  - NO TUMOR SEEN IN TWO LYMPH NODES.   C. SENTINEL LYMPH NODE, LEFT OBTURATOR; EXCISION:  - FIBROVASCULAR AND ADIPOSE TISSUE.    Problem List: Patient Active Problem List   Diagnosis Date Noted  . Post-operative state 05/20/2015  . Endometrial cancer 03/16/2015  . Bursitis, trochanteric 09/24/2014  . Anxiety 06/27/2014  . Breast CA 06/27/2014  . CAFL (chronic airflow limitation) 06/27/2014  . Clinical depression 06/27/2014  . Diabetes 06/27/2014  . Cerebrovascular accident, old 06/27/2014  . BP (high blood pressure) 06/27/2014  . HLD (hyperlipidemia) 06/27/2014  . Adaptive colitis 06/27/2014  . Compulsive tobacco user syndrome 06/27/2014  . DDD (degenerative disc disease), lumbar 05/19/2014  . Impingement syndrome of shoulder 05/19/2014  . Neuritis or radiculitis due to rupture of lumbar intervertebral disc 05/19/2014    Past Medical History: Past Medical History  Diagnosis Date  . Asthma   . Breast cancer, left breast 1995    Left Breast   . Endometrial cancer determined by uterine biopsy 2016  . Diabetes mellitus without complication     Patient takes Januvia  . BP (high blood pressure) 06/27/2014  . IBS (irritable bowel syndrome)   . Hyperlipidemia   . COPD (chronic obstructive pulmonary disease)   . Depression   . Anxiety   . Atypical squamous cell changes of undetermined significance (ASCUS) on cervical cytology with positive high risk human papilloma virus (HPV) 03/16/15  . Breast cancer   . Heart murmur   . Migraine   . Arthritis   . Stroke   . GERD (gastroesophageal reflux disease)   .  DDD (degenerative disc disease), lumbar     Past Surgical History: Past Surgical History  Procedure Laterality Date  . Cholecystectomy, laparoscopic     . Breast lumpectomy  2012    [place]-bilateral breast lumpectomy  . Esophagogastroduodenoscopy  03/02/2008, 10/12/2010, 03/28/2013  . Cataract extraction  2013  . Cholecystectomy    . Eye surgery    . Robotic assisted total hysterectomy N/A 05/20/2015    Procedure: ROBOTIC ASSISTED TOTAL HYSTERECTOMY;  Surgeon: Gillis Ends, MD;  Location: [facility] ORS;  Service: Gynecology;  Laterality: N/A;  . Laparoscopic bilateral salpingo oopherectomy Bilateral 05/20/2015    Procedure: LAPAROSCOPIC BILATERAL SALPINGO OOPHORECTOMY;  Surgeon: Gillis Ends, MD;  Location: [facility] ORS;  Service: Gynecology;  Laterality: Bilateral;    Family History: Family History  Problem Relation Age of Onset  . Lung cancer Mother     BONE, BRAIN METS  . Brain cancer Mother   . Breast cancer Maternal Aunt   . Coronary artery disease Father   . Diabetes Mellitus II Mother   . Hyperlipidemia Brother     X 2 BROTHERS  . Hypertension Brother   . Hypertension Mother     X 2 BROTHERS  . Hypertension Sister     Social History: History   Social History  . Marital Status: Married    Spouse Name: N/A  . Number of Children: N/A  . Years of Education: N/A   Occupational History  . Not on file.   Social History Main Topics  . Smoking status: Current Every Day Smoker -- 2.00 packs/day for 52 years    Types: Cigarettes  . Smokeless tobacco: Never Used  . Alcohol Use: No  . Drug Use: No  . Sexual Activity: Not on file   Other Topics Concern  . Not on file   Social History Narrative    Allergies: Allergies  Allergen Reactions  . Sulfa Antibiotics Rash    Current Medications: Current Outpatient Prescriptions  Medication Sig Dispense Refill  . albuterol (PROVENTIL HFA;VENTOLIN HFA) 108 (90 BASE) MCG/ACT inhaler Inhale 2 puffs into the lungs every 4 (four) hours as needed for wheezing. For wheezing    . ARIPiprazole (ABILIFY) 5 MG tablet Take 5 mg by mouth daily.    Marland Kitchen aspirin EC 81 MG tablet  Take 1 tablet by mouth daily.    Marland Kitchen atorvastatin (LIPITOR) 40 MG tablet Take 40 mg by mouth daily.    Marland Kitchen docusate sodium (COLACE) 100 MG capsule Take 1 capsule (100 mg total) by mouth 2 (two) times daily. 30 capsule 0  . fluconazole (DIFLUCAN) 150 MG tablet Take 150 mg by mouth at bedtime.    . gabapentin (NEURONTIN) 300 MG capsule Take 300 mg by mouth 2 (two) times daily.     . naproxen (NAPROSYN) 500 MG tablet Take 1 tablet (500 mg total) by mouth 2 (two) times daily with a meal. 60 tablet 1  . OLANZapine (ZYPREXA) 2.5 MG tablet Take 2.5 mg by mouth daily.    Marland Kitchen omeprazole (PRILOSEC) 20 MG capsule Take 20 mg by mouth 2 (two) times daily.    . ondansetron (ZOFRAN) 4 MG tablet Take 1 tablet (4 mg total) by mouth every 6 (six) hours as needed for nausea. 20 tablet 0  . oxyCODONE-acetaminophen (PERCOCET/ROXICET) 5-325 MG per tablet Take 1-2 tablets by mouth every 4 (four) hours as needed (moderate to severe pain (when tolerating fluids)). 30 tablet 0  . sitaGLIPtin (JANUVIA) 100 MG tablet Take 100 mg by mouth daily.  No current facility-administered medications for this visit.      Review of Systems A comprehensive review of systems was negative except for chronic pulmonary issues, arthritic pain,   Objective:  BP 123/77 mmHg  Pulse 85  Temp(Src) 98.1 F (36.7 C) (Oral)  Wt 165 lb 10.8 oz (75.15 kg)  ECOG Performance Status: 0 - Asymptomatic  General appearance: alert, cooperative and appears stated age 60: ATNC Lymph node survey: negative axillary, inguinal, supraclavicular nodes Cardiovascular: regular rate and rhythm Respiratory: clear to auscultation Abdomen: no palpable masses, no hernias, well healed laparoscopic incisions,soft, nontender, nondistended, no ascites Extremities: no lower extremity edema Neurological exam reveals alert, oriented, normal speech, no focal findings or movement disorder noted  Pelvic: Vulva: 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 female with stage 1A, grade 2 endometrioid endometrial cancer arising in a polyp (3 mm tumor) with no invasion of the myometrium. Normal post op exam.      Plan:   Problem List Items Addressed This Visit      Genitourinary   Endometrial cancer - Primary     She may resume normal activities and will RTC for surveillance in 6 months.  She can return sooner if any concerning symptoms arise.   Mellody Drown, MD  CC:  Dr. Laverta Baltimore M.D.

## 2015-12-23 ENCOUNTER — Inpatient Hospital Stay: Payer: Self-pay

## 2015-12-23 ENCOUNTER — Inpatient Hospital Stay: Payer: Medicare Other | Attending: Obstetrics and Gynecology | Admitting: Obstetrics and Gynecology

## 2015-12-23 VITALS — BP 95/68 | HR 94 | Temp 96.8°F | Resp 20 | Ht 69.5 in | Wt 161.8 lb

## 2015-12-23 DIAGNOSIS — Z8542 Personal history of malignant neoplasm of other parts of uterus: Secondary | ICD-10-CM | POA: Diagnosis present

## 2015-12-23 DIAGNOSIS — R109 Unspecified abdominal pain: Secondary | ICD-10-CM | POA: Diagnosis not present

## 2015-12-23 DIAGNOSIS — C541 Malignant neoplasm of endometrium: Secondary | ICD-10-CM

## 2015-12-23 DIAGNOSIS — Z79899 Other long term (current) drug therapy: Secondary | ICD-10-CM | POA: Insufficient documentation

## 2015-12-23 DIAGNOSIS — J449 Chronic obstructive pulmonary disease, unspecified: Secondary | ICD-10-CM | POA: Insufficient documentation

## 2015-12-23 DIAGNOSIS — N9089 Other specified noninflammatory disorders of vulva and perineum: Secondary | ICD-10-CM

## 2015-12-23 DIAGNOSIS — Z90722 Acquired absence of ovaries, bilateral: Secondary | ICD-10-CM | POA: Insufficient documentation

## 2015-12-23 DIAGNOSIS — R634 Abnormal weight loss: Secondary | ICD-10-CM | POA: Insufficient documentation

## 2015-12-23 DIAGNOSIS — I10 Essential (primary) hypertension: Secondary | ICD-10-CM | POA: Insufficient documentation

## 2015-12-23 DIAGNOSIS — Z90710 Acquired absence of both cervix and uterus: Secondary | ICD-10-CM | POA: Diagnosis not present

## 2015-12-23 DIAGNOSIS — E785 Hyperlipidemia, unspecified: Secondary | ICD-10-CM | POA: Insufficient documentation

## 2015-12-23 DIAGNOSIS — E119 Type 2 diabetes mellitus without complications: Secondary | ICD-10-CM | POA: Diagnosis not present

## 2015-12-23 DIAGNOSIS — F17210 Nicotine dependence, cigarettes, uncomplicated: Secondary | ICD-10-CM | POA: Diagnosis not present

## 2015-12-23 DIAGNOSIS — Z8673 Personal history of transient ischemic attack (TIA), and cerebral infarction without residual deficits: Secondary | ICD-10-CM | POA: Diagnosis not present

## 2015-12-23 DIAGNOSIS — M5136 Other intervertebral disc degeneration, lumbar region: Secondary | ICD-10-CM | POA: Diagnosis not present

## 2015-12-23 DIAGNOSIS — R053 Chronic cough: Secondary | ICD-10-CM

## 2015-12-23 DIAGNOSIS — R05 Cough: Secondary | ICD-10-CM

## 2015-12-23 NOTE — Progress Notes (Signed)
Pt here and states her abdomen has been hurting for 2 weeks. She has been dx bronchitis and UTI. She has already finished the atb for bronchitis and she is still on PCN for the UTI.  She states that about 1 week before christmas she weighed 170 and now today she weighs 160 and she has not changed her eating habits.

## 2015-12-23 NOTE — Progress Notes (Addendum)
Gynecologic Oncology Consult Note  Referring physician: Dr. Laverta Baltimore M.D.  Chief Complaint: Continued surveillance for endometrial cancer.   Subjective:  Michelle Hays is a 70 y.o. woman who presents  stage 1A, grade 2 endometrioid endometrial cancer who presents for surveillance.   She says she has lost 10 pounds unintentionally since she was last seen. She had a CT scan last week from [place] that was negative but we don't have that report. She also complains of a cough that was recently diagnosed as bronchitis and treated with antibiotics. She complains of urinary pain and was treated with antibiotics for a UTI. She has persistent abdominal pain.   Oncology Treatment History: The patient presented to Dr. Ouida Sills in consultation from Dr. [name] for abdominal cramping and an ultrasound demonstrating a thickened endometrial stripe measuring 12 mm. Dr. Ouida Sills performed Pap smear as well as endometrial biopsy on 03/16/2015. Her uterus sounded to 7 cm. The Pap smear demonstrated atypical squamous cells of undetermined significance. 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  03/24/2015 CT scan abdomen and pelvis IMPRESSION: Known endometrial cancer is not evident on CT. Multiple calcified uterine fibroids. No findings specific for metastatic disease.  05/20/15  ROBOTIC ASSISTED TOTAL HYSTERECTOMY LAPAROSCOPIC BILATERAL SALPINGO OOPHORECTOMY, SENTINEL LYMPH NODE INJECTION AND MAPPING, BILATERAL LYMPH NODE SAMPLING, 3 mm tumor, grade 2 endometrioid, no invasion, negative adnexa, cytology, and SLNs.  Of note, she has multiple significant medical comorbidities specifically COPD and a greater than 100 pack year tobacco history. She has an albuterol inhaler and has occasional flares. When she has a flare she has an albuterol treatment; she has never required  intubation. She tolerated anesthesia well when she had her cholecystectomy.  Problem List: Patient Active Problem List   Diagnosis Date Noted  . Post-operative state 05/20/2015  . Endometrial cancer ([place]) 03/16/2015  . Bursitis, trochanteric 09/24/2014  . Anxiety 06/27/2014  . Breast CA ([place]) 06/27/2014  . CAFL (chronic airflow limitation) ([place]) 06/27/2014  . Clinical depression 06/27/2014  . Diabetes ([place]) 06/27/2014  . Cerebrovascular accident, old 06/27/2014  . BP (high blood pressure) 06/27/2014  . HLD (hyperlipidemia) 06/27/2014  . Adaptive colitis 06/27/2014  . Compulsive tobacco user syndrome 06/27/2014  . DDD (degenerative disc disease), lumbar 05/19/2014  . Impingement syndrome of shoulder 05/19/2014  . Neuritis or radiculitis due to rupture of lumbar intervertebral disc 05/19/2014    Past Medical History: Past Medical History  Diagnosis Date  . Asthma   . Breast cancer, left breast ([place]) 1995    Left Breast   . Endometrial cancer determined by uterine biopsy ([place]) 2016  . Diabetes mellitus without complication [facility])     Patient takes Januvia  . BP (high blood pressure) 06/27/2014  . IBS (irritable bowel syndrome)   . Hyperlipidemia   . COPD (chronic obstructive pulmonary disease) ([place])   . Depression   . Anxiety   . Atypical squamous cell changes of undetermined significance (ASCUS) on cervical cytology with positive high risk human papilloma virus (HPV) 03/16/15  . Breast cancer ([place])   . Heart murmur   . Migraine   . Arthritis   . Stroke ([place])   . GERD (gastroesophageal reflux disease)   . DDD (degenerative disc disease), lumbar     Past Surgical History: Past Surgical History  Procedure Laterality Date  . Cholecystectomy, laparoscopic    . Breast lumpectomy  2012    [place]-bilateral breast  lumpectomy  . Esophagogastroduodenoscopy  03/02/2008, 10/12/2010, 03/28/2013  . Cataract extraction  2013  . Cholecystectomy    . Eye surgery    . Robotic assisted  total hysterectomy N/A 05/20/2015    Procedure: ROBOTIC ASSISTED TOTAL HYSTERECTOMY;  Surgeon: Michelle Ends, MD;  Location: [facility] ORS;  Service: Gynecology;  Laterality: N/A;  . Laparoscopic bilateral salpingo oopherectomy Bilateral 05/20/2015    Procedure: LAPAROSCOPIC BILATERAL SALPINGO OOPHORECTOMY;  Surgeon: Michelle Ends, MD;  Location: [facility] ORS;  Service: Gynecology;  Laterality: Bilateral;    Family History: Family History  Problem Relation Age of Onset  . Lung cancer Mother     BONE, BRAIN METS  . Brain cancer Mother   . Breast cancer Maternal Aunt   . Coronary artery disease Father   . Diabetes Mellitus II Mother   . Hyperlipidemia Brother     X 2 BROTHERS  . Hypertension Brother   . Hypertension Mother     X 2 BROTHERS  . Hypertension Sister     Social History: Social History   Social History  . Marital Status: Married    Spouse Name: N/A  . Number of Children: N/A  . Years of Education: N/A   Occupational History  . Not on file.   Social History Main Topics  . Smoking status: Current Every Day Smoker -- 2.00 packs/day for 52 years    Types: Cigarettes  . Smokeless tobacco: Never Used  . Alcohol Use: No  . Drug Use: No  . Sexual Activity: Not on file   Other Topics Concern  . Not on file   Social History Narrative    Allergies: Allergies  Allergen Reactions  . Sulfa Antibiotics Rash    Current Medications: Current Outpatient Prescriptions  Medication Sig Dispense Refill  . albuterol (PROVENTIL HFA;VENTOLIN HFA) 108 (90 BASE) MCG/ACT inhaler Inhale 2 puffs into the lungs every 4 (four) hours as needed for wheezing. For wheezing    . ARIPiprazole (ABILIFY) 5 MG tablet Take 5 mg by mouth daily.    Marland Kitchen aspirin EC 81 MG tablet Take 1 tablet by mouth daily.    Marland Kitchen atorvastatin (LIPITOR) 40 MG tablet Take 40 mg by mouth daily.    Marland Kitchen docusate sodium (COLACE) 100 MG capsule Take 1 capsule (100 mg total) by mouth 2 (two) times daily. 30 capsule  0  . gabapentin (NEURONTIN) 300 MG capsule Take 300 mg by mouth 2 (two) times daily.     . naproxen (NAPROSYN) 500 MG tablet Take 1 tablet (500 mg total) by mouth 2 (two) times daily with a meal. 60 tablet 1  . OLANZapine (ZYPREXA) 2.5 MG tablet Take 2.5 mg by mouth daily.    Marland Kitchen omeprazole (PRILOSEC) 20 MG capsule Take 20 mg by mouth 2 (two) times daily.    . ondansetron (ZOFRAN) 4 MG tablet Take 1 tablet (4 mg total) by mouth every 6 (six) hours as needed for nausea. 20 tablet 0  . oxyCODONE-acetaminophen (PERCOCET/ROXICET) 5-325 MG per tablet Take 1-2 tablets by mouth every 4 (four) hours as needed (moderate to severe pain (when tolerating fluids)). 30 tablet 0  . penicillin v potassium (VEETID) 500 MG tablet Take 500 mg by mouth 2 (two) times daily.    . sitaGLIPtin (JANUVIA) 100 MG tablet Take 100 mg by mouth daily.     No current facility-administered medications for this visit.      Review of Systems General: no complaints  HEENT: no complaints  Lungs: Chronic lung complaints,  cough, see H&P  Cardiac: no complaints  GI: see H&P other wise negative  GU: vulvar irritation, long standing per patient, she has a cream she applies as needed. We called the pharmacy and she is on clobetasol.   Musculoskeletal: no complaints  Extremities: no complaints  Skin: no complaints  Neuro: no complaints  Endocrine: no complaints  Psych: no complaints        Objective:  BP 95/68 mmHg  Pulse 94  Temp(Src) 96.8 F (36 C) (Tympanic)  Resp 20  Ht 5' 9.5" (1.765 m)  Wt 161 lb 13.1 oz (73.4 kg)  BMI 23.56 kg/m2  ECOG Performance Status: 0 - Asymptomatic  General appearance: alert, cooperative and appears stated age 67: ATNC Lymph node survey: negative axillary, inguinal, supraclavicular nodes Cardiovascular: regular rate and rhythm Respiratory: clear to auscultation Abdomen: no palpable masses, no hernias, well healed laparoscopic incisions,soft, nontender, nondistended, no  ascites Extremities: no lower extremity edema Neurological exam reveals alert, oriented, normal speech, no focal findings or movement disorder noted  Pelvic: Vulva: vulvar erythema and vulvar hypopigmentation c/w lichen sclerosus diffusely; Vagina: normal vagina with cuff healing well; Bimanual: no masses; Cervix/uterus: surgically absent. Rectal: not done  Assessment:  ATLAS DIGANGI is a 70 y.o. female with stage 1A, grade 2 endometrioid endometrial cancer arising in a polyp (3 mm tumor) with no invasion of the myometrium. Abdominal pain and weight loss worrisome for recurrence. Exam NED. Tobacco use.   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      Plan:   Problem List Items Addressed This Visit      Genitourinary   Endometrial cancer ([place]) - Primary    Other Visit Diagnoses    Abdominal pain, unspecified abdominal location        Vulvar irritation          We will try to obtain outside CT scan result. If she did not have chest imaging we will order a CXR.   If negative imaging we will have her RTC for surveillance in 6 months.  She can return sooner if any concerning symptoms arise. If she is doing well at that point we will start alternating with Dr. Ouida Sills every 6 months for 5 years.  If her vulvar symptoms do not improve with clobetasol we can obtain a biopsy to confirm the diagnosis an rule out other vulvar pathology.      Michelle Ends, MD   ADDENDUM: 01/06/2016 Outside CT scan reviewed. The scan was negative for metastatic disease. She does have 3.2 cm oval shaped density presacrum. She did not have chest imaging. We will order CXR.  Michelle Ends, MD   CC:  Dr. Laverta Baltimore M.D.

## 2015-12-23 NOTE — Patient Instructions (Signed)
Smoking Cessation, Tips for Success If you are ready to quit smoking, congratulations! You have chosen to help yourself be healthier. Cigarettes bring nicotine, tar, carbon monoxide, and other irritants into your body. Your lungs, heart, and blood vessels will be able to work better without these poisons. There are many different ways to quit smoking. Nicotine gum, nicotine patches, a nicotine inhaler, or nicotine nasal spray can help with physical craving. Hypnosis, support groups, and medicines help break the habit of smoking. WHAT THINGS CAN I DO TO MAKE QUITTING EASIER?  Here are some tips to help you quit for good:  Pick a date when you will quit smoking completely. Tell all of your friends and family about your plan to quit on that date.  Do not try to slowly cut down on the number of cigarettes you are smoking. Pick a quit date and quit smoking completely starting on that day.  Throw away all cigarettes.   Clean and remove all ashtrays from your home, work, and car.  On a card, write down your reasons for quitting. Carry the card with you and read it when you get the urge to smoke.  Cleanse your body of nicotine. Drink enough water and fluids to keep your urine clear or pale yellow. Do this after quitting to flush the nicotine from your body.  Learn to predict your moods. Do not let a bad situation be your excuse to have a cigarette. Some situations in your life might tempt you into wanting a cigarette.  Never have "just one" cigarette. It leads to wanting another and another. Remind yourself of your decision to quit.  Change habits associated with smoking. If you smoked while driving or when feeling stressed, try other activities to replace smoking. Stand up when drinking your coffee. Brush your teeth after eating. Sit in a different chair when you read the paper. Avoid alcohol while trying to quit, and try to drink fewer caffeinated beverages. Alcohol and caffeine may urge you to  smoke.  Avoid foods and drinks that can trigger a desire to smoke, such as sugary or spicy foods and alcohol.  Ask people who smoke not to smoke around you.  Have something planned to do right after eating or having a cup of coffee. For example, plan to take a walk or exercise.  Try a relaxation exercise to calm you down and decrease your stress. Remember, you may be tense and nervous for the first 2 weeks after you quit, but this will pass.  Find new activities to keep your hands busy. Play with a pen, coin, or rubber band. Doodle or draw things on paper.  Brush your teeth right after eating. This will help cut down on the craving for the taste of tobacco after meals. You can also try mouthwash.   Use oral substitutes in place of cigarettes. Try using lemon drops, carrots, cinnamon sticks, or chewing gum. Keep them handy so they are available when you have the urge to smoke.  When you have the urge to smoke, try deep breathing.  Designate your home as a nonsmoking area.  If you are a heavy smoker, ask your health care provider about a prescription for nicotine chewing gum. It can ease your withdrawal from nicotine.  Reward yourself. Set aside the cigarette money you save and buy yourself something nice.  Look for support from others. Join a support group or smoking cessation program. Ask someone at home or at work to help you with your plan   to quit smoking.  Always ask yourself, "Do I need this cigarette or is this just a reflex?" Tell yourself, "Today, I choose not to smoke," or "I do not want to smoke." You are reminding yourself of your decision to quit.  Do not replace cigarette smoking with electronic cigarettes (commonly called e-cigarettes). The safety of e-cigarettes is unknown, and some may contain harmful chemicals.  If you relapse, do not give up! Plan ahead and think about what you will do the next time you get the urge to smoke. HOW WILL I FEEL WHEN I QUIT SMOKING? You  may have symptoms of withdrawal because your body is used to nicotine (the addictive substance in cigarettes). You may crave cigarettes, be irritable, feel very hungry, cough often, get headaches, or have difficulty concentrating. The withdrawal symptoms are only temporary. They are strongest when you first quit but will go away within 10-14 days. When withdrawal symptoms occur, stay in control. Think about your reasons for quitting. Remind yourself that these are signs that your body is healing and getting used to being without cigarettes. Remember that withdrawal symptoms are easier to treat than the major diseases that smoking can cause.  Even after the withdrawal is over, expect periodic urges to smoke. However, these cravings are generally short lived and will go away whether you smoke or not. Do not smoke! WHAT RESOURCES ARE AVAILABLE TO HELP ME QUIT SMOKING? Your health care provider can direct you to community resources or hospitals for support, which may include:  Group support.  Education.  Hypnosis.  Therapy.   This information is not intended to replace advice given to you by your health care provider. Make sure you discuss any questions you have with your health care provider.   Document Released: 08/05/2004 Document Revised: 11/28/2014 Document Reviewed: 04/25/2013 Elsevier Interactive Patient Education 2016 Elsevier Inc.  

## 2016-01-06 NOTE — Addendum Note (Signed)
Addended by: Gillis Ends on: 01/06/2016 01:12 PM   Modules accepted: Orders

## 2016-01-12 ENCOUNTER — Ambulatory Visit
Admission: RE | Admit: 2016-01-12 | Discharge: 2016-01-12 | Disposition: A | Discharge status: Home / Self Care | Payer: Medicare Other | Source: Ambulatory Visit | Attending: Obstetrics and Gynecology | Admitting: Obstetrics and Gynecology

## 2016-01-12 DIAGNOSIS — J449 Chronic obstructive pulmonary disease, unspecified: Secondary | ICD-10-CM | POA: Insufficient documentation

## 2016-01-12 DIAGNOSIS — C541 Malignant neoplasm of endometrium: Secondary | ICD-10-CM | POA: Insufficient documentation

## 2016-01-12 DIAGNOSIS — R05 Cough: Secondary | ICD-10-CM

## 2016-01-12 DIAGNOSIS — R053 Chronic cough: Secondary | ICD-10-CM

## 2016-01-15 ENCOUNTER — Telehealth: Payer: Self-pay

## 2016-01-15 NOTE — Telephone Encounter (Signed)
  Oncology Nurse Navigator Documentation  Navigator Location: [facility] (01/15/16 1500)                                            Time Spent with Patient: 15 (01/15/16 1500)   Notified of CXR results

## 2016-05-06 ENCOUNTER — Other Ambulatory Visit: Payer: Self-pay | Admitting: Gastroenterology

## 2016-05-06 DIAGNOSIS — R1310 Dysphagia, unspecified: Secondary | ICD-10-CM

## 2016-05-13 ENCOUNTER — Ambulatory Visit
Admission: RE | Admit: 2016-05-13 | Discharge: 2016-05-13 | Disposition: A | Discharge status: Home / Self Care | Payer: Medicare Other | Source: Ambulatory Visit | Attending: Gastroenterology | Admitting: Gastroenterology

## 2016-05-13 DIAGNOSIS — R1310 Dysphagia, unspecified: Secondary | ICD-10-CM | POA: Insufficient documentation

## 2016-05-13 DIAGNOSIS — K449 Diaphragmatic hernia without obstruction or gangrene: Secondary | ICD-10-CM | POA: Insufficient documentation

## 2016-05-13 DIAGNOSIS — K219 Gastro-esophageal reflux disease without esophagitis: Secondary | ICD-10-CM | POA: Insufficient documentation

## 2016-05-13 DIAGNOSIS — K222 Esophageal obstruction: Secondary | ICD-10-CM | POA: Diagnosis not present

## 2016-07-05 ENCOUNTER — Encounter: Payer: Self-pay | Admitting: *Deleted

## 2016-07-06 ENCOUNTER — Encounter
Admission: RE | Disposition: A | Discharge status: Home Health | Payer: Self-pay | Source: Ambulatory Visit | Attending: Gastroenterology

## 2016-07-06 ENCOUNTER — Ambulatory Visit: Payer: Medicare Other | Admitting: Anesthesiology

## 2016-07-06 ENCOUNTER — Ambulatory Visit
Admission: RE | Admit: 2016-07-06 | Discharge: 2016-07-06 | Disposition: A | Discharge status: Home Health | Payer: Medicare Other | Source: Ambulatory Visit | Attending: Gastroenterology | Admitting: Gastroenterology

## 2016-07-06 DIAGNOSIS — K208 Other esophagitis: Secondary | ICD-10-CM | POA: Insufficient documentation

## 2016-07-06 DIAGNOSIS — M5136 Other intervertebral disc degeneration, lumbar region: Secondary | ICD-10-CM | POA: Insufficient documentation

## 2016-07-06 DIAGNOSIS — Z882 Allergy status to sulfonamides status: Secondary | ICD-10-CM | POA: Insufficient documentation

## 2016-07-06 DIAGNOSIS — I10 Essential (primary) hypertension: Secondary | ICD-10-CM | POA: Insufficient documentation

## 2016-07-06 DIAGNOSIS — Z7982 Long term (current) use of aspirin: Secondary | ICD-10-CM | POA: Insufficient documentation

## 2016-07-06 DIAGNOSIS — R011 Cardiac murmur, unspecified: Secondary | ICD-10-CM | POA: Diagnosis not present

## 2016-07-06 DIAGNOSIS — R1310 Dysphagia, unspecified: Secondary | ICD-10-CM | POA: Diagnosis not present

## 2016-07-06 DIAGNOSIS — M8580 Other specified disorders of bone density and structure, unspecified site: Secondary | ICD-10-CM | POA: Insufficient documentation

## 2016-07-06 DIAGNOSIS — E785 Hyperlipidemia, unspecified: Secondary | ICD-10-CM | POA: Diagnosis not present

## 2016-07-06 DIAGNOSIS — Z79899 Other long term (current) drug therapy: Secondary | ICD-10-CM | POA: Diagnosis not present

## 2016-07-06 DIAGNOSIS — M1990 Unspecified osteoarthritis, unspecified site: Secondary | ICD-10-CM | POA: Diagnosis not present

## 2016-07-06 DIAGNOSIS — K449 Diaphragmatic hernia without obstruction or gangrene: Secondary | ICD-10-CM | POA: Diagnosis not present

## 2016-07-06 DIAGNOSIS — G43909 Migraine, unspecified, not intractable, without status migrainosus: Secondary | ICD-10-CM | POA: Diagnosis not present

## 2016-07-06 DIAGNOSIS — E119 Type 2 diabetes mellitus without complications: Secondary | ICD-10-CM | POA: Diagnosis not present

## 2016-07-06 DIAGNOSIS — Z8673 Personal history of transient ischemic attack (TIA), and cerebral infarction without residual deficits: Secondary | ICD-10-CM | POA: Insufficient documentation

## 2016-07-06 DIAGNOSIS — K219 Gastro-esophageal reflux disease without esophagitis: Secondary | ICD-10-CM | POA: Diagnosis not present

## 2016-07-06 DIAGNOSIS — R1084 Generalized abdominal pain: Secondary | ICD-10-CM | POA: Diagnosis present

## 2016-07-06 DIAGNOSIS — K5730 Diverticulosis of large intestine without perforation or abscess without bleeding: Secondary | ICD-10-CM | POA: Insufficient documentation

## 2016-07-06 DIAGNOSIS — J449 Chronic obstructive pulmonary disease, unspecified: Secondary | ICD-10-CM | POA: Insufficient documentation

## 2016-07-06 DIAGNOSIS — B3789 Other sites of candidiasis: Secondary | ICD-10-CM | POA: Diagnosis not present

## 2016-07-06 DIAGNOSIS — Z8542 Personal history of malignant neoplasm of other parts of uterus: Secondary | ICD-10-CM | POA: Insufficient documentation

## 2016-07-06 DIAGNOSIS — F17200 Nicotine dependence, unspecified, uncomplicated: Secondary | ICD-10-CM | POA: Insufficient documentation

## 2016-07-06 DIAGNOSIS — D123 Benign neoplasm of transverse colon: Secondary | ICD-10-CM | POA: Insufficient documentation

## 2016-07-06 DIAGNOSIS — R634 Abnormal weight loss: Secondary | ICD-10-CM | POA: Insufficient documentation

## 2016-07-06 DIAGNOSIS — F419 Anxiety disorder, unspecified: Secondary | ICD-10-CM | POA: Insufficient documentation

## 2016-07-06 DIAGNOSIS — F329 Major depressive disorder, single episode, unspecified: Secondary | ICD-10-CM | POA: Insufficient documentation

## 2016-07-06 DIAGNOSIS — D125 Benign neoplasm of sigmoid colon: Secondary | ICD-10-CM | POA: Insufficient documentation

## 2016-07-06 DIAGNOSIS — Z853 Personal history of malignant neoplasm of breast: Secondary | ICD-10-CM | POA: Insufficient documentation

## 2016-07-06 DIAGNOSIS — D130 Benign neoplasm of esophagus: Secondary | ICD-10-CM | POA: Insufficient documentation

## 2016-07-06 DIAGNOSIS — K2950 Unspecified chronic gastritis without bleeding: Secondary | ICD-10-CM | POA: Diagnosis not present

## 2016-07-06 DIAGNOSIS — K589 Irritable bowel syndrome without diarrhea: Secondary | ICD-10-CM | POA: Diagnosis not present

## 2016-07-06 HISTORY — DX: Essential (primary) hypertension: I10

## 2016-07-06 HISTORY — DX: Other specified disorders of bone density and structure, unspecified site: M85.80

## 2016-07-06 HISTORY — PX: COLONOSCOPY WITH PROPOFOL: SHX5780

## 2016-07-06 HISTORY — PX: ESOPHAGOGASTRODUODENOSCOPY (EGD) WITH PROPOFOL: SHX5813

## 2016-07-06 LAB — KOH PREP

## 2016-07-06 LAB — GLUCOSE, CAPILLARY: GLUCOSE-CAPILLARY: 117 mg/dL — AB (ref 65–99)

## 2016-07-06 SURGERY — COLONOSCOPY WITH PROPOFOL
Anesthesia: General

## 2016-07-06 MED ORDER — SODIUM CHLORIDE 0.9 % IV SOLN: INTRAVENOUS | Status: DC

## 2016-07-06 MED ORDER — PHENYLEPHRINE HCL 10 MG/ML IJ SOLN
INTRAMUSCULAR | Status: DC | PRN
  Administered 2016-07-06 (×4): 100 ug via INTRAVENOUS

## 2016-07-06 MED ORDER — PROPOFOL 500 MG/50ML IV EMUL
INTRAVENOUS | Status: DC | PRN
  Administered 2016-07-06: 100 ug/kg/min via INTRAVENOUS

## 2016-07-06 MED ORDER — PROPOFOL 10 MG/ML IV BOLUS
INTRAVENOUS | Status: DC | PRN
  Administered 2016-07-06: 20 mg via INTRAVENOUS
  Administered 2016-07-06 (×2): 25 mg via INTRAVENOUS
  Administered 2016-07-06: 50 mg via INTRAVENOUS

## 2016-07-06 MED ORDER — LACTATED RINGERS IV SOLN
INTRAVENOUS | Status: DC | PRN
  Administered 2016-07-06 (×2): via INTRAVENOUS

## 2016-07-06 MED ORDER — SODIUM CHLORIDE 0.9 % IV SOLN
INTRAVENOUS | Status: DC
  Administered 2016-07-06: 13:00:00 via INTRAVENOUS

## 2016-07-06 NOTE — Anesthesia Preprocedure Evaluation (Signed)
Anesthesia Evaluation  Patient identified by MRN, date of birth, ID band Patient awake    Reviewed: Allergy & Precautions, NPO status , Patient's Chart, lab work & pertinent test results  Airway Mallampati: III       Dental  (+) Upper Dentures   Pulmonary asthma , COPD,  COPD inhaler, Current Smoker,    + rhonchi  + decreased breath sounds      Cardiovascular hypertension,  Rhythm:Regular     Neuro/Psych  Headaches, Anxiety Depression CVA    GI/Hepatic Neg liver ROS, GERD  Medicated,  Endo/Other  diabetes, Type 2, Oral Hypoglycemic Agents  Renal/GU negative Renal ROS     Musculoskeletal   Abdominal Normal abdominal exam  (+)   Peds  Hematology   Anesthesia Other Findings   Reproductive/Obstetrics                             Anesthesia Physical Anesthesia Plan  ASA: III  Anesthesia Plan: General   Post-op Pain Management:    Induction: Intravenous  Airway Management Planned: Natural Airway and Nasal Cannula  Additional Equipment:   Intra-op Plan:   Post-operative Plan:   Informed Consent: I have reviewed the patients History and Physical, chart, labs and discussed the procedure including the risks, benefits and alternatives for the proposed anesthesia with the patient or authorized representative who has indicated his/her understanding and acceptance.     Plan Discussed with: CRNA  Anesthesia Plan Comments:         Anesthesia Quick Evaluation

## 2016-07-06 NOTE — H&P (Signed)
Outpatient short stay form Pre-procedure 07/06/2016 1:42 PM Michelle Sails MD  Primary Physician:  [name] NP  Reason for visit:  EGD and colonoscopy  History of present illness:  Patient is a 70 year old female presenting today with multiple GI complaint. Patient does take Prilosec 20 mg twice a day. She also however takes Naprosyn daily and aspirin daily. She has had a barium swallow which showed no restriction to passage of a tablet. There is no overt ostial motility issue however there were some tertiary contractions in the distal third of the esophagus. Small hiatal hernia.  She has had increasing diarrhea for about 6 months. She does have a history of cholecystectomy about 4 years ago. When she was changed to Protonix this increased her diarrhea. Abdominal pain is not associated with eating.    Current Facility-Administered Medications:  .  0.9 %  sodium chloride infusion, , Intravenous, Continuous, Michelle Sails, MD, Last Rate: 20 mL/hr at 07/06/16 1324 .  0.9 %  sodium chloride infusion, , Intravenous, Continuous, Michelle Sails, MD  Prescriptions Prior to Admission  Medication Sig Dispense Refill Last Dose  . atorvastatin (LIPITOR) 40 MG tablet Take 40 mg by mouth daily.   07/05/2016 at 1100  . DULoxetine (CYMBALTA) 60 MG capsule Take 60 mg by mouth daily.     Marland Kitchen gabapentin (NEURONTIN) 300 MG capsule Take 300 mg by mouth 2 (two) times daily.    07/05/2016 at 1100  . HYDROcodone-acetaminophen (NORCO) 10-325 MG tablet Take 1 tablet by mouth every 6 (six) hours as needed.     . methylPREDNISolone (MEDROL) 4 MG tablet Take 4 mg by mouth daily.     . metroNIDAZOLE (FLAGYL) 500 MG tablet Take 500 mg by mouth 3 (three) times daily.     Marland Kitchen omeprazole (PRILOSEC) 20 MG capsule Take 20 mg by mouth 2 (two) times daily.   07/05/2016 at 1100  . vortioxetine HBr (TRINTELLIX) 20 MG TABS Take 20 mg by mouth daily.     Marland Kitchen albuterol (PROVENTIL HFA;VENTOLIN HFA) 108 (90 BASE) MCG/ACT  inhaler Inhale 2 puffs into the lungs every 4 (four) hours as needed for wheezing. For wheezing   Taking  . ARIPiprazole (ABILIFY) 5 MG tablet Take 5 mg by mouth daily.   Taking  . aspirin EC 81 MG tablet Take 1 tablet by mouth daily.   Taking  . docusate sodium (COLACE) 100 MG capsule Take 1 capsule (100 mg total) by mouth 2 (two) times daily. 30 capsule 0 Taking  . naproxen (NAPROSYN) 500 MG tablet Take 1 tablet (500 mg total) by mouth 2 (two) times daily with a meal. 60 tablet 1 Taking  . OLANZapine (ZYPREXA) 2.5 MG tablet Take 2.5 mg by mouth daily.   Taking  . ondansetron (ZOFRAN) 4 MG tablet Take 1 tablet (4 mg total) by mouth every 6 (six) hours as needed for nausea. 20 tablet 0 Taking  . oxyCODONE-acetaminophen (PERCOCET/ROXICET) 5-325 MG per tablet Take 1-2 tablets by mouth every 4 (four) hours as needed (moderate to severe pain (when tolerating fluids)). 30 tablet 0 Taking  . penicillin v potassium (VEETID) 500 MG tablet Take 500 mg by mouth 2 (two) times daily.     . sitaGLIPtin (JANUVIA) 100 MG tablet Take 100 mg by mouth daily.   Taking     Allergies  Allergen Reactions  . Sulfa Antibiotics Rash     Past Medical History:  Diagnosis Date  . Anxiety   . Arthritis   .  Asthma   . Atypical squamous cell changes of undetermined significance (ASCUS) on cervical cytology with positive high risk human papilloma virus (HPV) 03/16/15  . BP (high blood pressure) 06/27/2014  . Breast cancer ([place])   . Breast cancer, left breast ([place]) 1995   Left Breast   . COPD (chronic obstructive pulmonary disease) ([place])   . DDD (degenerative disc disease), lumbar   . Depression   . Diabetes mellitus without complication [facility])    Patient takes Januvia  . Endometrial cancer determined by uterine biopsy ([place]) 2016  . GERD (gastroesophageal reflux disease)   . Heart murmur   . Hyperlipidemia   . Hypertension   . IBS (irritable bowel syndrome)   . Migraine   . Osteopenia   . Stroke [facility])      Review of systems:      Physical Exam    Heart and lungs: Regular rate and rhythm without rub or gallop, lungs are bilaterally clear.    HEENT: Normal cephalic atraumatic eyes are anicteric    Other:     Pertinant exam for procedure: Soft nontender nondistended bowel sounds positive normoactive    Planned proceedures: EGD and colonoscopy with indicated procedures. I have discussed the risks benefits and complications of procedures to include not limited to bleeding, infection, perforation and the risk of sedation and the patient wishes to proceed.    Michelle Sails, MD Gastroenterology 07/06/2016  1:42 PM

## 2016-07-06 NOTE — Transfer of Care (Signed)
Immediate Anesthesia Transfer of Care Note  Patient: Michelle Hays  Procedure(s) Performed: Procedure(s): COLONOSCOPY WITH PROPOFOL (N/A) ESOPHAGOGASTRODUODENOSCOPY (EGD) WITH PROPOFOL (N/A)  Patient Location: PACU  Anesthesia Type:General  Level of Consciousness: patient cooperative and lethargic  Airway & Oxygen Therapy: Patient Spontanous Breathing and Patient connected to face mask oxygen  Post-op Assessment: Report given to RN and Post -op Vital signs reviewed and stable  Post vital signs: Reviewed and stable  Last Vitals:  Vitals:   07/06/16 1525 07/06/16 1530  BP: 101/60 101/60  Pulse: 100 100  Resp: (!) 23   Temp: (!) 35.8 C     Last Pain:  Vitals:   07/06/16 1525  TempSrc: Tympanic         Complications: No apparent anesthesia complications

## 2016-07-06 NOTE — Op Note (Signed)
[facility] Gastroenterology Patient Name: Michelle Hays Procedure Date: 07/06/2016 1:26 PM MRN: [id number] Account #: [id number] Date of Birth: Apr 02, 1946 Admit Type: Outpatient Age: 70 Room: [facility] ENDO ROOM 1 Gender: Female Note Status: Finalized Procedure:            Colonoscopy Indications:          Generalized abdominal pain, Chronic diarrhea, Weight                        loss Providers:            Lollie Sails, MD Referring MD:         Renee Rival (Referring MD) Medicines:            Monitored Anesthesia Care Complications:        No immediate complications. Procedure:            Pre-Anesthesia Assessment:                       - ASA Grade Assessment: III - A patient with severe                        systemic disease.                       After obtaining informed consent, the colonoscope was                        passed under direct vision. Throughout the procedure,                        the patient's blood pressure, pulse, and oxygen                        saturations were monitored continuously. The                        Colonoscope was introduced through the anus and                        advanced to the the cecum, identified by appendiceal                        orifice and ileocecal valve. The colonoscopy was                        unusually difficult due to poor bowel prep and a                        tortuous colon. Successful completion of the procedure                        was aided by changing the patient to a supine position,                        changing the patient to a prone position, using manual                        pressure, withdrawing and reinserting the scope and  lavage. The patient tolerated the procedure well. The                        quality of the bowel preparation was fair. Findings:      A 10 mm polyp was found in the transverse colon. The polyp was       semi-pedunculated. The  polyp was removed with a cold snare. Resection       and retrieval were complete.      Two semi-pedunculated polyps were found in the hepatic flexure. The       polyps were 4 to 9 mm in size. These polyps were removed with a cold       snare. Resection and retrieval were complete.      Biopsies for histology were taken with a cold forceps from the right       colon and left colon for evaluation of microscopic colitis.      Two sessile polyps were found in the distal sigmoid colon. The polyps       were 1 to 2 mm in size. These polyps were removed with a cold biopsy       forceps. Resection and retrieval were complete.      The digital rectal exam was normal.      Multiple medium-mouthed diverticula were found in the sigmoid colon,       descending colon, transverse colon and ascending colon. Impression:           - Preparation of the colon was fair.                       - One 10 mm polyp in the transverse colon, removed with                        a cold snare. Resected and retrieved.                       - Two 4 to 9 mm polyps at the hepatic flexure, removed                        with a cold snare. Resected and retrieved.                       - Two 1 to 2 mm polyps in the distal sigmoid colon,                        removed with a cold biopsy forceps. Resected and                        retrieved.                       - Diverticulosis in the sigmoid colon, in the                        descending colon, in the transverse colon and in the                        ascending colon.                       - Biopsies were taken with a  cold forceps from the                        right colon and left colon for evaluation of                        microscopic colitis. Recommendation:       - Discharge patient to home.                       - Await pathology results.                       - Return to GI clinic in 3 weeks. Procedure Code(s):    --- Professional ---                        [id number], Colonoscopy, flexible; with removal of tumor(s),                        polyp(s), or other lesion(s) by snare technique                       45380, 7, Colonoscopy, flexible; with biopsy, single                        or multiple Diagnosis Code(s):    --- Professional ---                       D12.3, Benign neoplasm of transverse colon (hepatic                        flexure or splenic flexure)                       D12.5, Benign neoplasm of sigmoid colon                       R10.84, Generalized abdominal pain                       K52.9, Noninfective gastroenteritis and colitis,                        unspecified                       R63.4, Abnormal weight loss                       K57.30, Diverticulosis of large intestine without                        perforation or abscess without bleeding CPT copyright 2016 American Medical Association. All rights reserved. The codes documented in this report are preliminary and upon coder review may  be revised to meet current compliance requirements. Lollie Sails, MD 07/06/2016 3:24:23 PM This report has been signed electronically. Number of Addenda: 0 Note Initiated On: 07/06/2016 1:26 PM Scope Withdrawal Time: 0 hours 18 minutes 43 seconds  Total Procedure Duration: 0 hours 52 minutes 48 seconds       [facility]

## 2016-07-06 NOTE — Op Note (Signed)
[facility] Gastroenterology Patient Name: Michelle Hays Procedure Date: 07/06/2016 1:27 PM MRN: [id number] Account #: [id number] Date of Birth: 1946/06/20 Admit Type: Outpatient Age: 70 Room: [facility] ENDO ROOM 1 Gender: Female Note Status: Finalized Procedure:            Upper GI endoscopy Indications:          Dysphagia, Gastro-esophageal reflux disease, Nausea                        with vomiting Providers:            Lollie Sails, MD Referring MD:         Renee Rival (Referring MD) Medicines:            Monitored Anesthesia Care Complications:        No immediate complications. Procedure:            Pre-Anesthesia Assessment:                       - ASA Grade Assessment: III - A patient with severe                        systemic disease.                       After obtaining informed consent, the endoscope was                        passed under direct vision. Throughout the procedure,                        the patient's blood pressure, pulse, and oxygen                        saturations were monitored continuously. The Endoscope                        was introduced through the mouth, and advanced to the                        third part of duodenum. The upper GI endoscopy was                        accomplished without difficulty. The patient tolerated                        the procedure well. Findings:      Abnormal motility was noted in the lower third of the esophagus. The       cricopharyngeus was normal. There is spasticity of the esophageal body.       Tertiary peristaltic waves are noted.      A single 2 mm mucosal nodule with a localized distribution was found in       the middle third of the esophagus, 32 cm from the incisors. Biopsies       were taken with a cold forceps for histology.      LA Grade C (one or more mucosal breaks continuous between tops of 2 or       more mucosal folds, less than 75% circumference) esophagitis with no        bleeding was found. Biopsies were  taken with a cold forceps for       histology.      Diffuse mild mucosal variance characterized by nodularity was found in       the gastric body. Biopsies were taken with a cold forceps for histology.      tight J shaped stomach, difficult to maintain insufflation due to       eructation.      A small hiatal hernia was found. The Z-line was a variable distance from       incisors; the hiatal hernia was sliding.      The examined duodenum was normal.      Patchy candidiasis was found in the middle third of the esophagus and in       the lower third of the esophagus. Cells for cytology were obtained by       brushing.      A non-bleeding diverticulum with a small opening and no stigmata of       recent bleeding was found in the mid esophagus. Impression:           - Abnormal esophageal motility, suspicious for                        presbyesophagus.                       - Mucosal nodule found in the esophagus. Biopsied.                       - LA Grade C erosive esophagitis. Biopsied.                       - Gastric mucosal variant. Biopsied.                       - Small hiatal hernia.                       - Normal examined duodenum. Recommendation:       - Perform a colonoscopy today. Procedure Code(s):    --- Professional ---                       [phone], Esophagogastroduodenoscopy, flexible, transoral;                        with biopsy, single or multiple Diagnosis Code(s):    --- Professional ---                       K22.4, Dyskinesia of esophagus                       K22.8, Other specified diseases of esophagus                       K20.8, Other esophagitis                       K31.89, Other diseases of stomach and duodenum                       K44.9, Diaphragmatic hernia without obstruction or  gangrene                       R13.10, Dysphagia, unspecified                       K21.9, Gastro-esophageal reflux  disease without                        esophagitis                       R11.2, Nausea with vomiting, unspecified CPT copyright 2016 American Medical Association. All rights reserved. The codes documented in this report are preliminary and upon coder review may  be revised to meet current compliance requirements. Lollie Sails, MD 07/06/2016 2:21:56 PM This report has been signed electronically. Number of Addenda: 0 Note Initiated On: 07/06/2016 1:27 PM      [facility]

## 2016-07-07 ENCOUNTER — Encounter: Payer: Self-pay | Admitting: Gastroenterology

## 2016-07-07 NOTE — Anesthesia Postprocedure Evaluation (Signed)
Anesthesia Post Note  Patient: Michelle Hays  Procedure(s) Performed: Procedure(s) (LRB): COLONOSCOPY WITH PROPOFOL (N/A) ESOPHAGOGASTRODUODENOSCOPY (EGD) WITH PROPOFOL (N/A)  Patient location during evaluation: PACU Anesthesia Type: General Level of consciousness: awake Pain management: pain level controlled Vital Signs Assessment: post-procedure vital signs reviewed and stable Respiratory status: spontaneous breathing Cardiovascular status: stable Anesthetic complications: no    Last Vitals:  Vitals:   07/06/16 1549 07/06/16 1558  BP: 137/72 123/60  Pulse: (!) 101   Resp: (!) 22 17  Temp:      Last Pain:  Vitals:   07/06/16 1525  TempSrc: Tympanic                 VAN STAVEREN,Jermarion Poffenberger

## 2016-07-08 LAB — SURGICAL PATHOLOGY

## 2016-08-04 IMAGING — CR DG CHEST 2V
2 series · 2 of 2 positions shown · non-contrast
Comparison: PA and lateral chest x-ray April 27, 2015

CLINICAL DATA: Chronic cough, history of asthma, COPD, breast and
endometrial malignancy, current heavy smoker, here for checkup.

EXAM:
CHEST  2 VIEW

[chest pa]
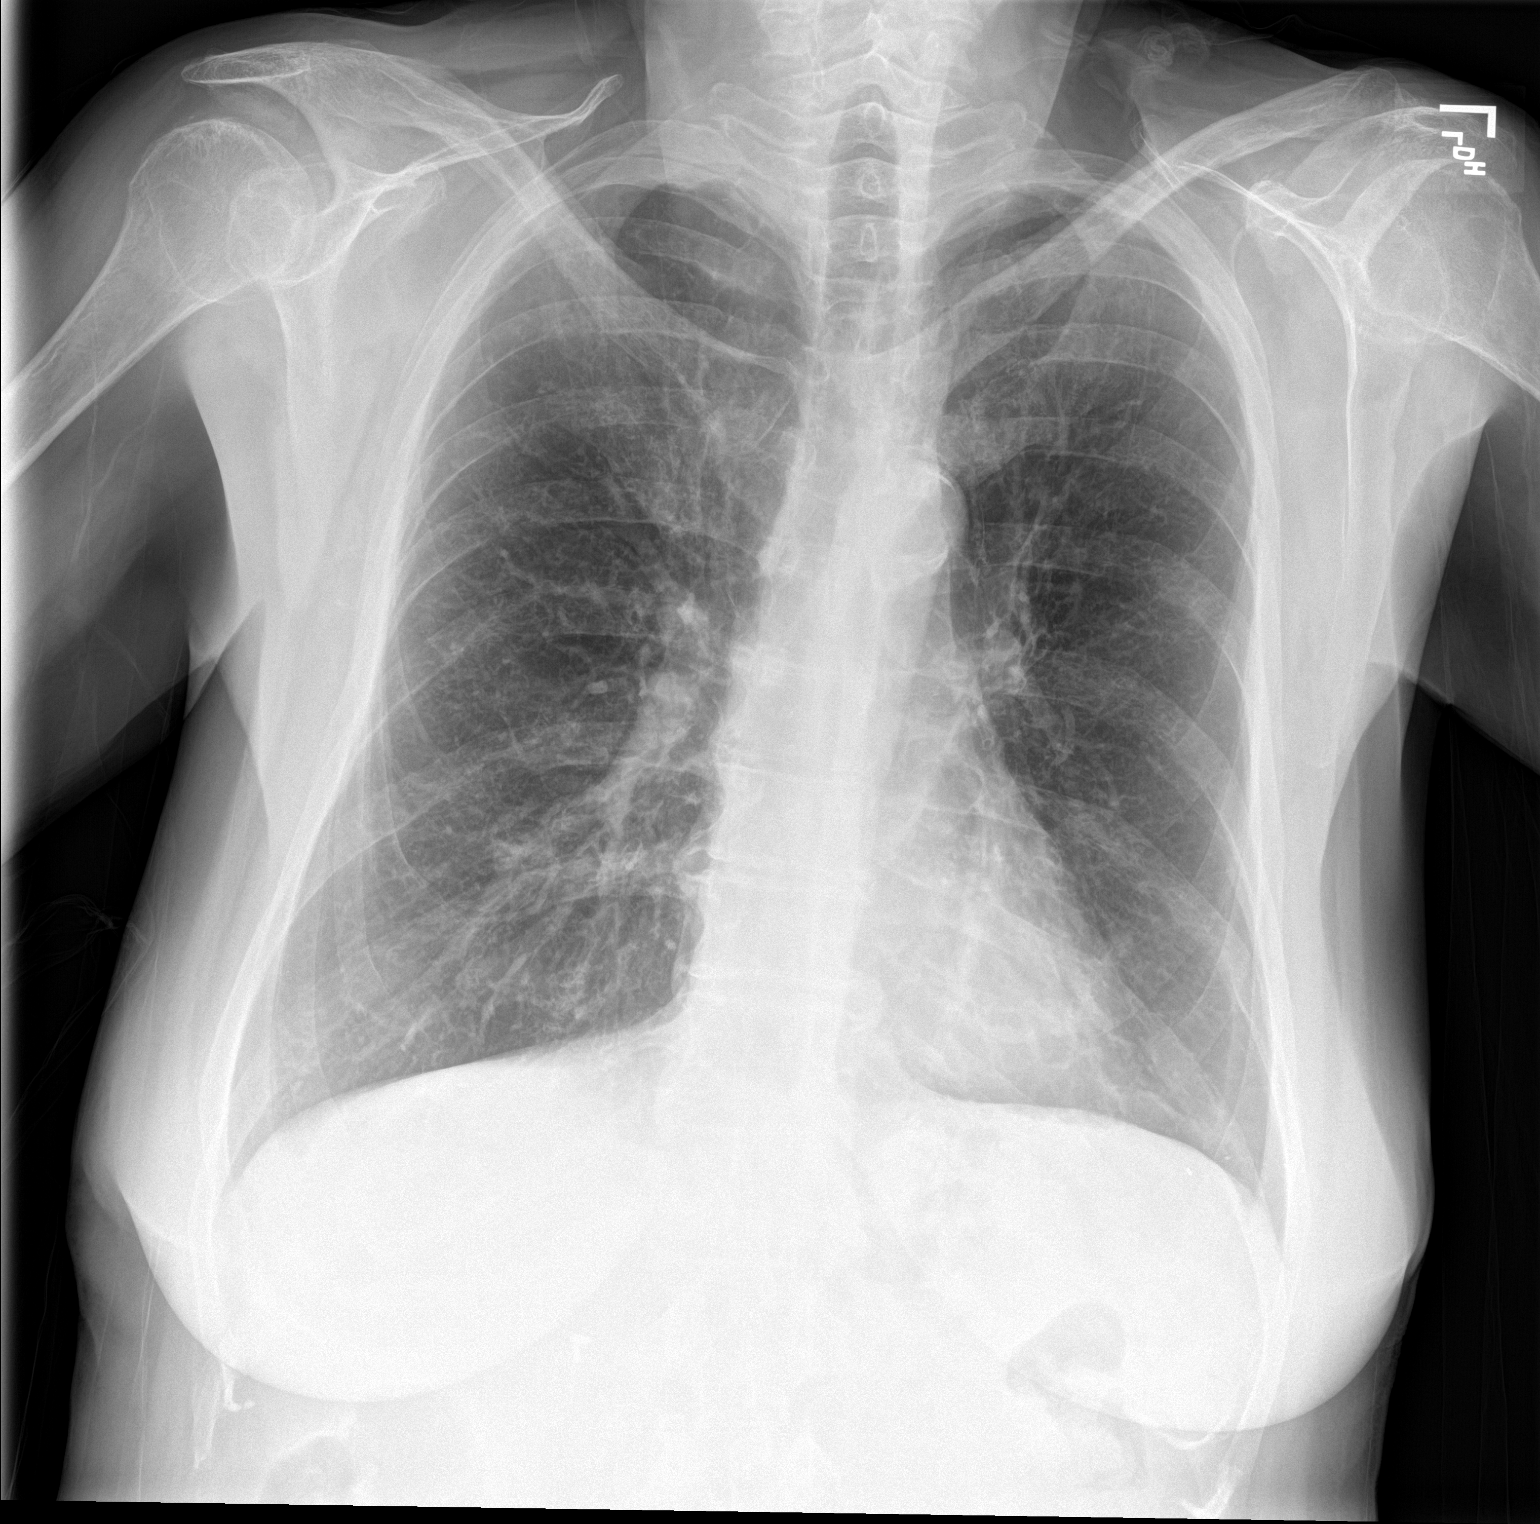

[chest lat]
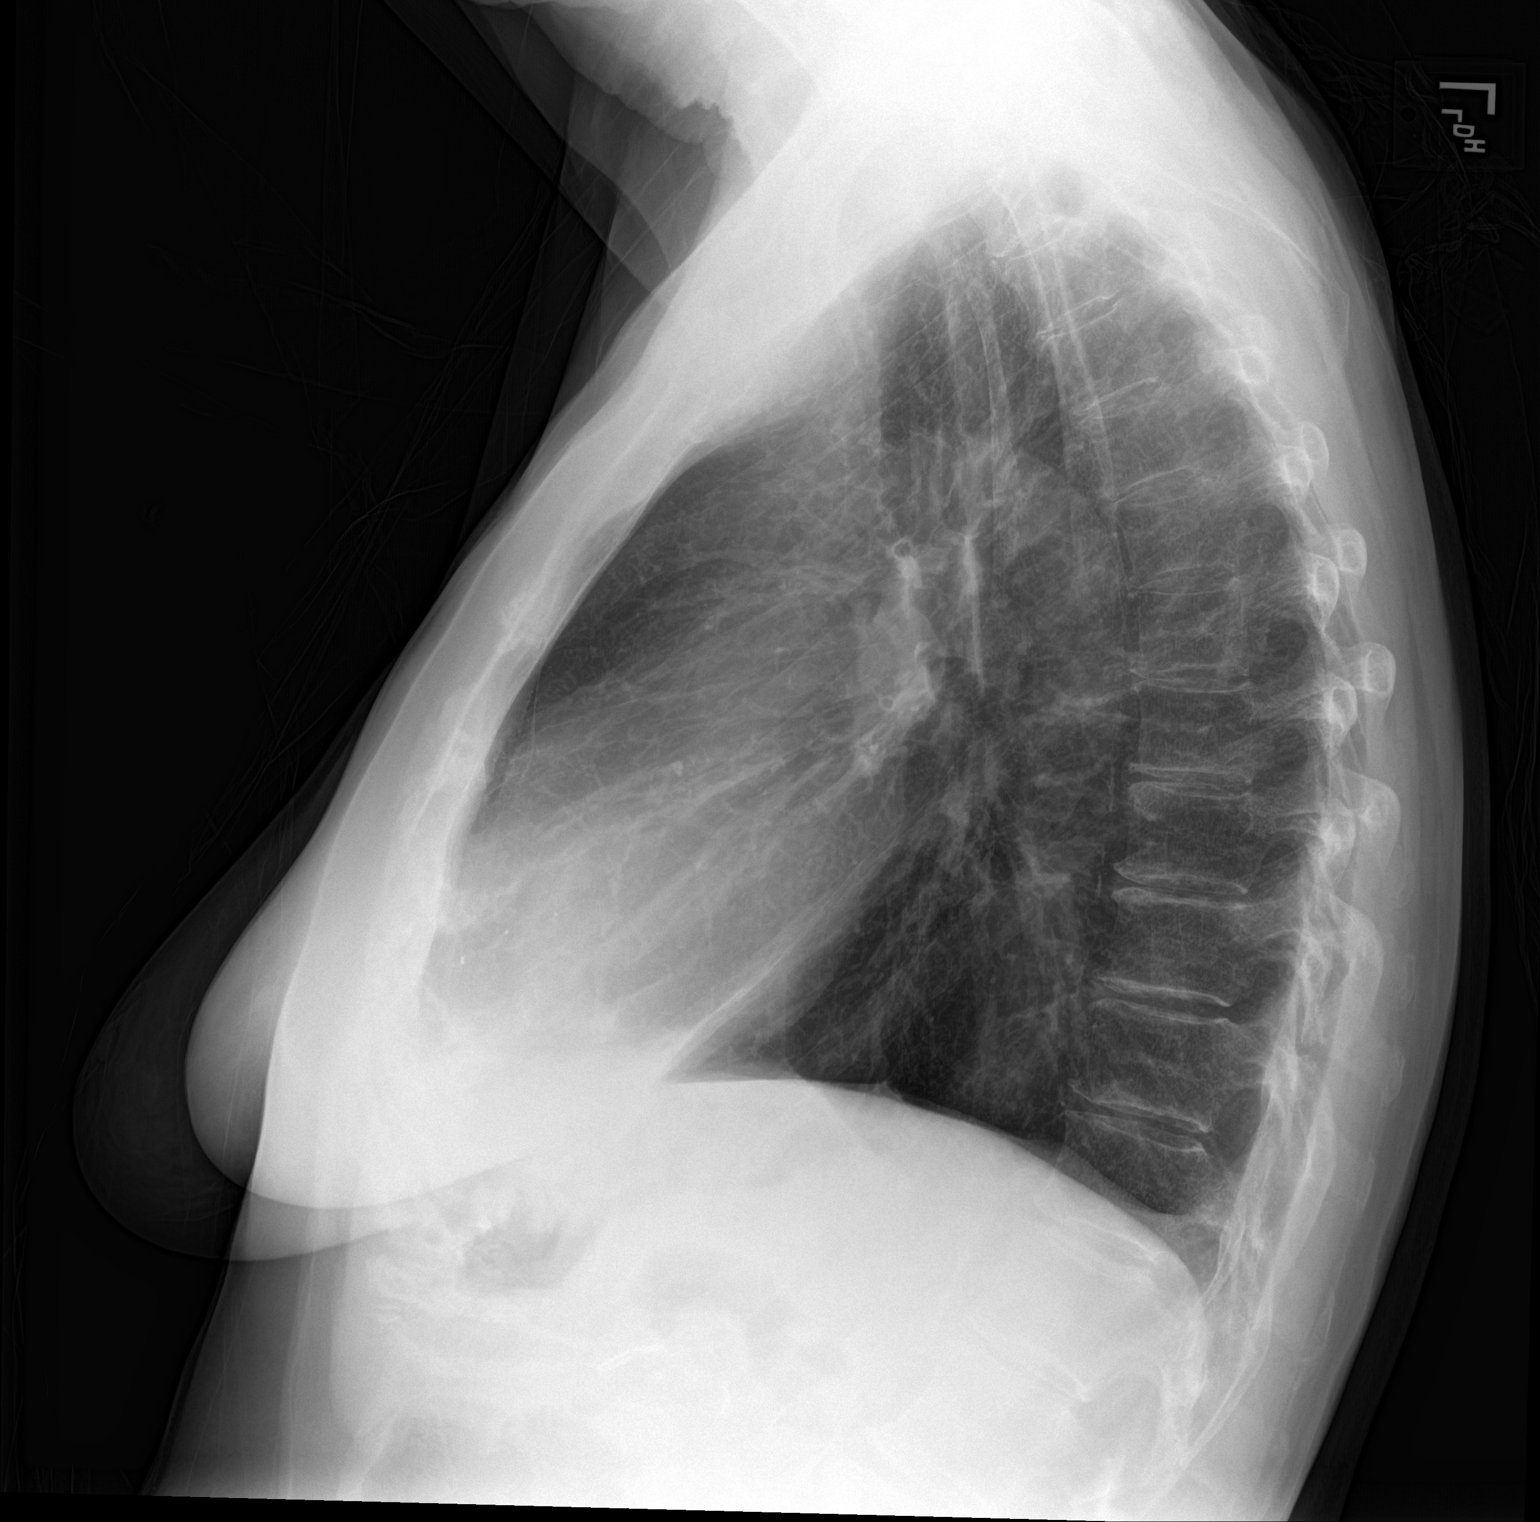

[2 of 2 positions shown; findings below may reference images not displayed]

FINDINGS: The lungs remain mildly hyperinflated with hemidiaphragm flattening.
There is no focal infiltrate. There is no pleural effusion. No
pulmonary parenchymal nodules or masses are observed. The heart and
pulmonary vascularity are normal. The mediastinum is normal in
width. The trachea is midline. The bony thorax exhibits no acute
abnormality.
IMPRESSION: COPD. There is no evidence of pneumonia, CHF, metastatic disease,
nor other acute cardiopulmonary abnormality.

## 2016-10-24 ENCOUNTER — Other Ambulatory Visit
Admission: RE | Admit: 2016-10-24 | Discharge: 2016-10-24 | Disposition: A | Discharge status: Home / Self Care | Payer: Medicare Other | Source: Ambulatory Visit | Attending: Gastroenterology | Admitting: Gastroenterology

## 2016-10-24 DIAGNOSIS — R197 Diarrhea, unspecified: Secondary | ICD-10-CM | POA: Insufficient documentation

## 2016-10-24 DIAGNOSIS — R1084 Generalized abdominal pain: Secondary | ICD-10-CM | POA: Insufficient documentation

## 2016-10-24 LAB — C DIFFICILE QUICK SCREEN W PCR REFLEX
C DIFFICILE (CDIFF) INTERP: NOT DETECTED
C DIFFICILE (CDIFF) TOXIN: NEGATIVE
C DIFFICLE (CDIFF) ANTIGEN: NEGATIVE

## 2016-10-27 LAB — GIARDIA, EIA; OVA/PARASITE: Giardia Ag, Stl: NEGATIVE

## 2016-10-27 LAB — O&P RESULT

## 2016-10-28 LAB — STOOL CULTURE: E COLI SHIGA TOXIN ASSAY: NEGATIVE

## 2016-10-28 LAB — STOOL CULTURE REFLEX - CMPCXR

## 2016-10-28 LAB — STOOL CULTURE REFLEX - RSASHR

## 2016-11-10 ENCOUNTER — Emergency Department
Admission: EM | Admit: 2016-11-10 | Discharge: 2016-11-10 | Disposition: A | Discharge status: Home / Self Care | Payer: Medicare Other | Attending: Emergency Medicine | Admitting: Emergency Medicine

## 2016-11-10 ENCOUNTER — Encounter: Payer: Self-pay | Admitting: Emergency Medicine

## 2016-11-10 DIAGNOSIS — J45909 Unspecified asthma, uncomplicated: Secondary | ICD-10-CM | POA: Insufficient documentation

## 2016-11-10 DIAGNOSIS — Z7982 Long term (current) use of aspirin: Secondary | ICD-10-CM | POA: Insufficient documentation

## 2016-11-10 DIAGNOSIS — K529 Noninfective gastroenteritis and colitis, unspecified: Secondary | ICD-10-CM

## 2016-11-10 DIAGNOSIS — E119 Type 2 diabetes mellitus without complications: Secondary | ICD-10-CM | POA: Insufficient documentation

## 2016-11-10 DIAGNOSIS — J449 Chronic obstructive pulmonary disease, unspecified: Secondary | ICD-10-CM | POA: Insufficient documentation

## 2016-11-10 DIAGNOSIS — R197 Diarrhea, unspecified: Secondary | ICD-10-CM | POA: Insufficient documentation

## 2016-11-10 DIAGNOSIS — Z79899 Other long term (current) drug therapy: Secondary | ICD-10-CM | POA: Insufficient documentation

## 2016-11-10 DIAGNOSIS — F17210 Nicotine dependence, cigarettes, uncomplicated: Secondary | ICD-10-CM | POA: Diagnosis not present

## 2016-11-10 DIAGNOSIS — I10 Essential (primary) hypertension: Secondary | ICD-10-CM | POA: Insufficient documentation

## 2016-11-10 DIAGNOSIS — Z853 Personal history of malignant neoplasm of breast: Secondary | ICD-10-CM | POA: Diagnosis not present

## 2016-11-10 DIAGNOSIS — R109 Unspecified abdominal pain: Secondary | ICD-10-CM | POA: Diagnosis present

## 2016-11-10 LAB — CBC
HCT: 45.3 % (ref 35.0–47.0)
Hemoglobin: 15.7 g/dL (ref 12.0–16.0)
MCH: 29.6 pg (ref 26.0–34.0)
MCHC: 34.6 g/dL (ref 32.0–36.0)
MCV: 85.5 fL (ref 80.0–100.0)
PLATELETS: 394 10*3/uL (ref 150–440)
RBC: 5.3 MIL/uL — ABNORMAL HIGH (ref 3.80–5.20)
RDW: 13.6 % (ref 11.5–14.5)
WBC: 14.3 10*3/uL — AB (ref 3.6–11.0)

## 2016-11-10 LAB — COMPREHENSIVE METABOLIC PANEL
ALK PHOS: 105 U/L (ref 38–126)
ALT: 24 U/L (ref 14–54)
AST: 33 U/L (ref 15–41)
Albumin: 2.4 g/dL — ABNORMAL LOW (ref 3.5–5.0)
Anion gap: 11 (ref 5–15)
BILIRUBIN TOTAL: 0.9 mg/dL (ref 0.3–1.2)
BUN: 9 mg/dL (ref 6–20)
CALCIUM: 6.1 mg/dL — AB (ref 8.9–10.3)
CHLORIDE: 102 mmol/L (ref 101–111)
CO2: 27 mmol/L (ref 22–32)
CREATININE: 0.67 mg/dL (ref 0.44–1.00)
GFR calc Af Amer: >60 mL/min (ref >60)
Glucose, Bld: 93 mg/dL (ref 65–99)
Potassium: 3.4 mmol/L — ABNORMAL LOW (ref 3.5–5.1)
Sodium: 140 mmol/L (ref 135–145)
Total Protein: 6.2 g/dL — ABNORMAL LOW (ref 6.5–8.1)

## 2016-11-10 LAB — LIPASE, BLOOD

## 2016-11-10 MED ORDER — SODIUM CHLORIDE 0.9 % IV SOLN
1.0000 g | Freq: Once | INTRAVENOUS | Status: AC
  Administered 2016-11-10: 1 g via INTRAVENOUS

## 2016-11-10 MED ORDER — IOPAMIDOL (ISOVUE-300) INJECTION 61%: 30.0000 mL | Freq: Once | INTRAVENOUS | Status: DC | PRN

## 2016-11-10 MED ORDER — SODIUM CHLORIDE 0.9 % IV BOLUS (SEPSIS)
1000.0000 mL | Freq: Once | INTRAVENOUS | Status: AC
  Administered 2016-11-10: 1000 mL via INTRAVENOUS

## 2016-11-10 MED ORDER — CALCIUM GLUCONATE 10 % IV SOLN
INTRAVENOUS | Status: AC
  Filled 2016-11-10: qty 10

## 2016-11-10 MED ORDER — ONDANSETRON HCL 4 MG/2ML IJ SOLN
INTRAMUSCULAR | Status: AC
  Administered 2016-11-10: 4 mg via INTRAVENOUS
  Filled 2016-11-10: qty 2

## 2016-11-10 MED ORDER — ONDANSETRON HCL 4 MG/2ML IJ SOLN
4.0000 mg | Freq: Once | INTRAMUSCULAR | Status: AC
  Administered 2016-11-10: 4 mg via INTRAVENOUS

## 2016-11-10 NOTE — ED Provider Notes (Signed)
[facility] Emergency Department Provider Note  ____________________________________________   First MD Initiated Contact with Patient 11/10/16 0205     (approximate)  I have reviewed the triage vital signs and the nursing notes.   HISTORY  Chief Complaint Diarrhea; Abdominal Pain; and Emesis    HPI Michelle Hays is a 70 y.o. female history of chronic diarrhea as well as below listed chronic medical conditions presents to the emergency department with diarrhea and abdominal discomfort. Patient states that she feels as though she is dehydrated. Review the patient's chart revealed that she was seen by Dr. Gustavo Lah in September for similar complaint. [place]. Patient states that they have been unable to figure out why she has chronic diarrhea in addition patient admits to an approximate 30 pound weight loss in the last year   Past Medical History:  Diagnosis Date  . Anxiety   . Arthritis   . Asthma   . Atypical squamous cell changes of undetermined significance (ASCUS) on cervical cytology with positive high risk human papilloma virus (HPV) 03/16/15  . BP (high blood pressure) 06/27/2014  . Breast cancer ([place])   . Breast cancer, left breast ([place]) 1995   Left Breast   . COPD (chronic obstructive pulmonary disease) ([place])   . DDD (degenerative disc disease), lumbar   . Depression   . Diabetes mellitus without complication [facility])    Patient takes Januvia  . Endometrial cancer determined by uterine biopsy ([place]) 2016  . GERD (gastroesophageal reflux disease)   . Heart murmur   . Hyperlipidemia   . Hypertension   . IBS (irritable bowel syndrome)   . Migraine   . Osteopenia   . Stroke [facility])     Patient Active Problem List   Diagnosis Date Noted  . Post-operative state 05/20/2015  . Endometrial cancer ([place]) 03/16/2015  . Bursitis, trochanteric 09/24/2014  . Anxiety 06/27/2014  . Breast CA ([place]) 06/27/2014  . CAFL (chronic airflow limitation)  ([place]) 06/27/2014  . Clinical depression 06/27/2014  . Diabetes ([place]) 06/27/2014  . Cerebrovascular accident, old 06/27/2014  . BP (high blood pressure) 06/27/2014  . HLD (hyperlipidemia) 06/27/2014  . Adaptive colitis 06/27/2014  . Compulsive tobacco user syndrome 06/27/2014  . DDD (degenerative disc disease), lumbar 05/19/2014  . Impingement syndrome of shoulder 05/19/2014  . Neuritis or radiculitis due to rupture of lumbar intervertebral disc 05/19/2014    Past Surgical History:  Procedure Laterality Date  . ABDOMINAL HYSTERECTOMY    . BREAST LUMPECTOMY  2012   [place]-bilateral breast lumpectomy  . CATARACT EXTRACTION  2013  . CHOLECYSTECTOMY    . CHOLECYSTECTOMY, LAPAROSCOPIC    . COLONOSCOPY WITH PROPOFOL N/A 07/06/2016   Procedure: COLONOSCOPY WITH PROPOFOL;  Surgeon: Lollie Sails, MD;  Location: [facility] ENDOSCOPY;  Service: Endoscopy;  Laterality: N/A;  . ESOPHAGOGASTRODUODENOSCOPY  03/02/2008, 10/12/2010, 03/28/2013  . ESOPHAGOGASTRODUODENOSCOPY (EGD) WITH PROPOFOL N/A 07/06/2016   Procedure: ESOPHAGOGASTRODUODENOSCOPY (EGD) WITH PROPOFOL;  Surgeon: Lollie Sails, MD;  Location[facility] ENDOSCOPY;  Service: Endoscopy;  Laterality: N/A;  . EYE SURGERY    . LAPAROSCOPIC BILATERAL SALPINGO OOPHERECTOMY Bilateral 05/20/2015   Procedure: LAPAROSCOPIC BILATERAL SALPINGO OOPHORECTOMY;  Surgeon: Gillis Ends, MD;  Location: [facility] ORS;  Service: Gynecology;  Laterality: Bilateral;  . ROBOTIC ASSISTED TOTAL HYSTERECTOMY N/A 05/20/2015   Procedure: ROBOTIC ASSISTED TOTAL HYSTERECTOMY;  Surgeon: Gillis Ends, MD;  Location: [facility] ORS;  Service: Gynecology;  Laterality: N/A;    Prior to Admission medications   Medication Sig Start  Date End Date Taking? Authorizing Provider  albuterol (PROVENTIL HFA;VENTOLIN HFA) 108 (90 BASE) MCG/ACT inhaler Inhale 2 puffs into the lungs every 4 (four) hours as needed for wheezing. For wheezing    Historical Provider, MD  ARIPiprazole  (ABILIFY) 5 MG tablet Take 5 mg by mouth daily.    Historical Provider, MD  aspirin EC 81 MG tablet Take 1 tablet by mouth daily.    Historical Provider, MD  atorvastatin (LIPITOR) 40 MG tablet Take 40 mg by mouth daily.    Historical Provider, MD  docusate sodium (COLACE) 100 MG capsule Take 1 capsule (100 mg total) by mouth 2 (two) times daily. 05/21/15   Gwen Her Schermerhorn, MD  DULoxetine (CYMBALTA) 60 MG capsule Take 60 mg by mouth daily.    Historical Provider, MD  gabapentin (NEURONTIN) 300 MG capsule Take 300 mg by mouth 2 (two) times daily.     Historical Provider, MD  HYDROcodone-acetaminophen (NORCO) 10-325 MG tablet Take 1 tablet by mouth every 6 (six) hours as needed.    Historical Provider, MD  methylPREDNISolone (MEDROL) 4 MG tablet Take 4 mg by mouth daily.    Historical Provider, MD  metroNIDAZOLE (FLAGYL) 500 MG tablet Take 500 mg by mouth 3 (three) times daily.    Historical Provider, MD  naproxen (NAPROSYN) 500 MG tablet Take 1 tablet (500 mg total) by mouth 2 (two) times daily with a meal. 05/21/15   Gwen Her Schermerhorn, MD  OLANZapine (ZYPREXA) 2.5 MG tablet Take 2.5 mg by mouth daily. 06/25/14   Historical Provider, MD  omeprazole (PRILOSEC) 20 MG capsule Take 20 mg by mouth 2 (two) times daily.    Historical Provider, MD  ondansetron (ZOFRAN) 4 MG tablet Take 1 tablet (4 mg total) by mouth every 6 (six) hours as needed for nausea. 05/21/15   Gwen Her Schermerhorn, MD  oxyCODONE-acetaminophen (PERCOCET/ROXICET) 5-325 MG per tablet Take 1-2 tablets by mouth every 4 (four) hours as needed (moderate to severe pain (when tolerating fluids)). 05/21/15   Gwen Her Schermerhorn, MD  penicillin v potassium (VEETID) 500 MG tablet Take 500 mg by mouth 2 (two) times daily.    Historical Provider, MD  sitaGLIPtin (JANUVIA) 100 MG tablet Take 100 mg by mouth daily.    Historical Provider, MD  vortioxetine HBr (TRINTELLIX) 20 MG TABS Take 20 mg by mouth daily.    Historical Provider, MD     Allergies Sulfa antibiotics  Family History  Problem Relation Age of Onset  . Lung cancer Mother     BONE, BRAIN METS  . Brain cancer Mother   . Diabetes Mellitus II Mother   . Hypertension Mother     X 2 BROTHERS  . Breast cancer Maternal Aunt   . Coronary artery disease Father   . Hyperlipidemia Brother     X 2 BROTHERS  . Hypertension Brother   . Hypertension Sister     Social History Social History  Substance Use Topics  . Smoking status: Current Every Day Smoker    Packs/day: 2.00    Years: 52.00    Types: Cigarettes  . Smokeless tobacco: Never Used  . Alcohol use No    Review of Systems Constitutional: No fever/chills Eyes: No visual changes. ENT: No sore throat. Cardiovascular: Denies chest pain. Respiratory: Denies shortness of breath. Gastrointestinal: Positive for abdominal pain and diarrhea Genitourinary: Negative for dysuria. Musculoskeletal: Negative for back pain. Skin: Negative for rash. Neurological: Negative for headaches, focal weakness or numbness.  10-point ROS otherwise negative.  ____________________________________________   PHYSICAL EXAM:  VITAL SIGNS: ED Triage Vitals  Enc Vitals Group     BP 11/10/16 0048 118/60     Pulse Rate 11/10/16 0048 (!) 113     Resp 11/10/16 0048 18     Temp 11/10/16 0048 98.2 F (36.8 C)     Temp Source 11/10/16 0048 Oral     SpO2 11/10/16 0048 96 %     Weight 11/10/16 0050 145 lb (65.8 kg)     Height 11/10/16 0050 5\' 11"  (1.803 m)     Head Circumference --      Peak Flow --      Pain Score 11/10/16 0048 3     Pain Loc --      Pain Edu? --      Excl. in [place]? --     Constitutional: Alert and oriented. Apparent discomfort  Eyes: Conjunctivae are normal. PERRL. EOMI. Head: Atraumatic.Marland Kitchen Mouth/Throat: Mucous membranes are moist.  Oropharynx non-erythematous. Neck: No stridor.  No meningeal signs.   Cardiovascular: Normal rate, regular rhythm. Good peripheral circulation. Grossly normal heart  sounds. Respiratory: Normal respiratory effort.  No retractions. Lungs CTAB. Gastrointestinal: Generalized tenderness to palpation No distention.  Musculoskeletal: No lower extremity tenderness nor edema. No gross deformities of extremities. Neurologic:  Normal speech and language. No gross focal neurologic deficits are appreciated.  Skin:  Skin is warm, dry and intact. No rash noted. Psychiatric: Mood and affect are normal. Speech and behavior are normal.  ____________________________________________   LABS (all labs ordered are listed, but only abnormal results are displayed)  Labs Reviewed  LIPASE, BLOOD - Abnormal; Notable for the following:       Result Value   Lipase <10 (*)    All other components within normal limits  COMPREHENSIVE METABOLIC PANEL - Abnormal; Notable for the following:    Potassium 3.4 (*)    Calcium 6.1 (*)    Total Protein 6.2 (*)    Albumin 2.4 (*)    All other components within normal limits  CBC - Abnormal; Notable for the following:    WBC 14.3 (*)    RBC 5.30 (*)    All other components within normal limits   ____________________________________________  EKG  ED ECG REPORT I, [place] N Kohlton Gilpatrick, the attending physician, personally viewed and interpreted this ECG.   Date: 11/11/2016  EKG Time: 12:59 AM  Rate: 115  Rhythm: Sinus tachycardia  Axis: normal  Intervals: Normal  ST&T Change: None    Procedures    INITIAL IMPRESSION / ASSESSMENT AND PLAN / ED COURSE  Pertinent labs & imaging results that were available during my care of the patient were reviewed by me and considered in my medical decision making (see chart for details).  Given history physical exam findings concern for intra-abdominal pathology and as such CT scan of the abdomen ordered however the patient refused to have a CT scan performed. I informed the patient of the potential dangers of her decision however patient adamantly states that she does not want to performed.  Patient noted to have hypocalcemia with a also 6.1 as such calcium gluconate 1 g given. Patient is requesting discharge at this time   Clinical Course     ____________________________________________  FINAL CLINICAL IMPRESSION(S) / ED DIAGNOSES  Final diagnoses:  Chronic diarrhea  Hypocalcemia     MEDICATIONS GIVEN DURING THIS VISIT:  Medications  sodium chloride 0.9 % bolus 1,000 mL (0 mLs Intravenous Stopped 11/10/16 0342)  ondansetron (ZOFRAN) injection  4 mg (4 mg Intravenous Given 11/10/16 0236)  calcium gluconate 1 g in sodium chloride 0.9 % 100 mL IVPB (0 g Intravenous Stopped 11/10/16 0427)     NEW OUTPATIENT MEDICATIONS STARTED DURING THIS VISIT:  Discharge Medication List as of 11/10/2016  4:51 AM      Discharge Medication List as of 11/10/2016  4:51 AM      Discharge Medication List as of 11/10/2016  4:51 AM       Note:  This document was prepared using Dragon voice recognition software and may include unintentional dictation errors.    Gregor Hams, MD 11/11/16 [phone]

## 2016-11-10 NOTE — ED Triage Notes (Addendum)
Pt presents to ED with c/o watery diarrhea and generalized abd cramping for the past couple of months per her family. Pt husband also reports significant weight loss recently. Today pt vomited X2 and this afternoon pt noticed  tingling to her hands and feet. Pt appears uncomfortable at this time. Denies chest pain or shortness of breath.

## 2016-11-10 NOTE — ED Notes (Signed)
Pt states unable to give urine sample at this time 

## 2016-11-10 NOTE — ED Notes (Addendum)
Per CT tech, pt refusing CT.  Dr. Owens Shark aware

## 2016-11-16 ENCOUNTER — Ambulatory Visit: Payer: Medicare Other

## 2016-11-16 NOTE — Progress Notes (Signed)
  Oncology Nurse Navigator Documentation Patient cancelled appt for 12/27. Scheduling sent inbox to attempt to reschedule with her. Navigator Location: [facility] (11/16/16 1300)   )Navigator Encounter Type: Other (11/16/16 1300)                     Patient Visit Type: GynOnc (11/16/16 1300)   Barriers/Navigation Needs: Coordination of Care (11/16/16 1300)   Interventions: Coordination of Care (11/16/16 1300)   Coordination of Care: Appts (11/16/16 1300)                  Time Spent with Patient: 15 (11/16/16 1300)

## 2016-11-18 ENCOUNTER — Ambulatory Visit: Payer: Medicare Other | Admitting: Oncology

## 2016-12-22 ENCOUNTER — Telehealth: Payer: Self-pay

## 2016-12-22 NOTE — Telephone Encounter (Signed)
  Oncology Nurse Navigator Documentation Ms. Michelle Hays was last seen 12/2015 for follow up on endometrial cancer. She did not follow up in 06/2016 as indicated in Dr. Gershon Crane progress note. She had an appointment on 11/16/2016 that she cancelled and never rescheduled. I have called and spoken with [name] and highly encouraged, after educating her on the importance of routine follow up for her endometrial cancer, to reschedule. She states she has cancelled her appointment with Dr. Janese Banks for 2/2 and that she just got out of the hospital and was in no shape to go for an appointment. Offered her an appointment on 2/14 with Dr. Theora Gianotti. She again denied appointment and said she would call back and reschedule. Ensured she that had [facility] phone number to call back. Navigator Location: [facility] (12/22/16 1600)   )Navigator Encounter Type: Telephone;Follow-up Appt (12/22/16 1600) Telephone: Outgoing Call;Appt Confirmation/Clarification (12/22/16 1600)                       Barriers/Navigation Needs: Coordination of Care (12/22/16 1600)   Interventions: Coordination of Care (12/22/16 1600)   Coordination of Care: Appts (12/22/16 1600)                  Time Spent with Patient: 15 (12/22/16 1600)

## 2016-12-23 ENCOUNTER — Inpatient Hospital Stay: Payer: Medicare Other | Admitting: Oncology

## 2017-01-13 ENCOUNTER — Ambulatory Visit: Payer: Medicare Other | Admitting: Oncology

## 2017-01-16 NOTE — Progress Notes (Signed)
Hematology/Oncology Consult note 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 She had presented with abdominal cramping and ultrasound the mons treated thickened endometrial stripe measuring 12 mm. Pap smear showed ASCUS and endometrial biopsy showed small fragment of endometrial carcinoma of the endometrium moderately differentiated FIGO grade 2  03/24/2015 CT scan abdomen and pelvis IMPRESSION: Known endometrial cancer is not evident on CT. Multiple calcified uterine fibroids. No findings specific for metastatic disease.  05/20/15  ROBOTIC ASSISTED TOTAL HYSTERECTOMY LAPAROSCOPIC BILATERAL SALPINGO OOPHORECTOMY, SENTINEL LYMPH NODE INJECTION AND MAPPING, BILATERAL LYMPH NODE SAMPLING, 3 mm tumor, grade 2 endometrioid, no invasion, negative adnexa, cytology, and SLNs.  Of note, she has multiple significant medical comorbidities specifically COPD and a greater than 100 pack year tobacco history.  Patient was last seen by Dr. Theora Gianotti in February 2017 and at that time she had complained about 10 pounds of unintentional weight loss. At that time she reportedly had a CT at [facility] which was reportedly negative. Patient has not followed up with our clinic since then. Currently patient states that she has lost about 20 pounds of weight over  the last 6 months. She weighed 128 pounds at home and today she weighs 145 pounds from a baseline of 170 pounds. She has no appetite or energy and spends about 50% of her time in bed. She reports feeling cold. She was admitted to [facility] recently and was found to have hypotension along with low protein and low calcium. She also reports diffuse bone pain mainly in her back and lower extremities. She has smoked about 2 packs of cigarettes per day for the last 50 years and currently smokes about half a pack of cigarettes per day.    ECOG PS- 2 Pain scale- 3   Review of systems- Review of Systems  Constitutional: Positive for malaise/fatigue and weight loss. Negative for chills and fever.  HENT: Negative for congestion, ear discharge and nosebleeds.   Eyes: Negative for blurred vision.  Respiratory: Negative for cough, hemoptysis, sputum production, shortness of breath and wheezing.   Cardiovascular: Negative for chest pain, palpitations, orthopnea and claudication.  Gastrointestinal: Negative for abdominal pain, blood in stool, constipation, diarrhea, heartburn, melena, nausea and vomiting.  Genitourinary: Negative for dysuria, flank pain, frequency, hematuria and urgency.  Musculoskeletal: Positive for back pain and joint pain. Negative for myalgias.  Skin: Negative for rash.  Neurological: Negative for dizziness, tingling, focal weakness, seizures, weakness and headaches.  Endo/Heme/Allergies: Does not bruise/bleed easily.  Psychiatric/Behavioral: Negative for depression and suicidal ideas. The patient does not have insomnia.      Current treatment- observation  Allergies  Allergen Reactions  . Sulfa Antibiotics Rash     Past Medical History:  Diagnosis Date  . Anxiety   . Arthritis   . Asthma   . Atypical squamous cell changes of undetermined significance (ASCUS) on cervical cytology with positive high risk  human papilloma virus (HPV) 03/16/15  . BP (high blood  pressure) 06/27/2014  . Breast cancer ([place])   . Breast cancer, left breast ([place]) 1995   Left Breast   . COPD (chronic obstructive pulmonary disease) ([place])   . DDD (degenerative disc disease), lumbar   . Depression   . Diabetes mellitus without complication [facility])    Patient takes Januvia  . Endometrial cancer determined by uterine biopsy ([place]) 2016  . GERD (gastroesophageal reflux disease)   . Heart murmur   . Hyperlipidemia   . Hypertension   . IBS (irritable bowel syndrome)   . Migraine   . Osteopenia   . Stroke [facility])      Past Surgical History:  Procedure Laterality Date  . ABDOMINAL HYSTERECTOMY    . BREAST LUMPECTOMY  2012   [place]-bilateral breast lumpectomy  . CATARACT EXTRACTION  2013  . CHOLECYSTECTOMY    . CHOLECYSTECTOMY, LAPAROSCOPIC    . COLONOSCOPY WITH PROPOFOL N/A 07/06/2016   Procedure: COLONOSCOPY WITH PROPOFOL;  Surgeon: Lollie Sails, MD;  Location: [facility] ENDOSCOPY;  Service: Endoscopy;  Laterality: N/A;  . ESOPHAGOGASTRODUODENOSCOPY  03/02/2008, 10/12/2010, 03/28/2013  . ESOPHAGOGASTRODUODENOSCOPY (EGD) WITH PROPOFOL N/A 07/06/2016   Procedure: ESOPHAGOGASTRODUODENOSCOPY (EGD) WITH PROPOFOL;  Surgeon: Lollie Sails, MD;  Location: [facility] ENDOSCOPY;  Service: Endoscopy;  Laterality: N/A;  . EYE SURGERY    . LAPAROSCOPIC BILATERAL SALPINGO OOPHERECTOMY Bilateral 05/20/2015   Procedure: LAPAROSCOPIC BILATERAL SALPINGO OOPHORECTOMY;  Surgeon: Gillis Ends, MD;  Location: [facility] ORS;  Service: Gynecology;  Laterality: Bilateral;  . ROBOTIC ASSISTED TOTAL HYSTERECTOMY N/A 05/20/2015   Procedure: ROBOTIC ASSISTED TOTAL HYSTERECTOMY;  Surgeon: Gillis Ends, MD;  Location: [facility] ORS;  Service: Gynecology;  Laterality: N/A;    Social History   Social History  . Marital status: Married    Spouse name: N/A  . Number of children: N/A  . Years of education: N/A   Occupational History  . Not on file.   Social History Main Topics  . Smoking  status: Current Every Day Smoker    Packs/day: 2.00    Years: 52.00    Types: Cigarettes  . Smokeless tobacco: Never Used  . Alcohol use No  . Drug use: No  . Sexual activity: Not on file   Other Topics Concern  . Not on file   Social History Narrative  . No narrative on file    Family History  Problem Relation Age of Onset  . Lung cancer Mother     BONE, BRAIN METS  . Brain cancer Mother   . Diabetes Mellitus II Mother   . Hypertension Mother     X 2 BROTHERS  . Breast cancer Maternal Aunt   . Coronary artery disease Father   . Hyperlipidemia Brother     X 2 BROTHERS  . Hypertension Brother   . Hypertension Sister      Current Outpatient Prescriptions:  .  albuterol (PROVENTIL HFA;VENTOLIN HFA) 108 (90 BASE) MCG/ACT inhaler, Inhale 2 puffs into the lungs every 4 (four) hours as needed for wheezing. For wheezing, Disp: , Rfl:  .  ARIPiprazole (ABILIFY) 5 MG tablet, Take 5 mg by mouth daily., Disp: , Rfl:  .  aspirin EC 81 MG tablet, Take 1 tablet by mouth daily., Disp: , Rfl:  .  atorvastatin (LIPITOR) 40 MG tablet, Take 40 mg by mouth daily., Disp: , Rfl:  .  docusate sodium (COLACE) 100 MG capsule, Take 1 capsule (100 mg total) by mouth 2 (  two) times daily., Disp: 30 capsule, Rfl: 0 .  DULoxetine (CYMBALTA) 60 MG capsule, Take 60 mg by mouth daily., Disp: , Rfl:  .  gabapentin (NEURONTIN) 300 MG capsule, Take 300 mg by mouth 2 (two) times daily. , Disp: , Rfl:  .  HYDROcodone-acetaminophen (NORCO) 10-325 MG tablet, Take 1 tablet by mouth every 6 (six) hours as needed., Disp: , Rfl:  .  methylPREDNISolone (MEDROL) 4 MG tablet, Take 4 mg by mouth daily., Disp: , Rfl:  .  metroNIDAZOLE (FLAGYL) 500 MG tablet, Take 500 mg by mouth 3 (three) times daily., Disp: , Rfl:  .  naproxen (NAPROSYN) 500 MG tablet, Take 1 tablet (500 mg total) by mouth 2 (two) times daily with a meal., Disp: 60 tablet, Rfl: 1 .  OLANZapine (ZYPREXA) 2.5 MG tablet, Take 2.5 mg by mouth daily., Disp: ,  Rfl:  .  omeprazole (PRILOSEC) 20 MG capsule, Take 20 mg by mouth 2 (two) times daily., Disp: , Rfl:  .  ondansetron (ZOFRAN) 4 MG tablet, Take 1 tablet (4 mg total) by mouth every 6 (six) hours as needed for nausea., Disp: 20 tablet, Rfl: 0 .  oxyCODONE-acetaminophen (PERCOCET/ROXICET) 5-325 MG per tablet, Take 1-2 tablets by mouth every 4 (four) hours as needed (moderate to severe pain (when tolerating fluids))., Disp: 30 tablet, Rfl: 0 .  penicillin v potassium (VEETID) 500 MG tablet, Take 500 mg by mouth 2 (two) times daily., Disp: , Rfl:  .  sitaGLIPtin (JANUVIA) 100 MG tablet, Take 100 mg by mouth daily., Disp: , Rfl:  .  vortioxetine HBr (TRINTELLIX) 20 MG TABS, Take 20 mg by mouth daily., Disp: , Rfl:   Physical exam:  Vitals:   01/17/17 1347  BP: (!) 88/56  Pulse: (!) 111  Resp: 20  Temp: (!) 96.4 F (35.8 C)  TempSrc: Tympanic  Weight: 145 lb (65.8 kg)   Physical Exam  Constitutional: She is oriented to person, place, and time.  Patient is thin and cachectic. Appears fatigued. No acute distress. She is sitting in a wheelchair  HENT:  Head: Normocephalic and atraumatic.  Eyes: EOM are normal. Pupils are equal, round, and reactive to light.  Neck: Normal range of motion.  Cardiovascular: Regular rhythm and normal heart sounds.   Tachycardic  Pulmonary/Chest: Effort normal and breath sounds normal.  Abdominal: Soft. Bowel sounds are normal.  Neurological: She is alert and oriented to person, place, and time.  Skin: Skin is warm and dry.     CMP Latest Ref Rng & Units 11/10/2016  Glucose 65 - 99 mg/dL 93  BUN 6 - 20 mg/dL 9  Creatinine 0.44 - 1.00 mg/dL 0.67  Sodium 135 - 145 mmol/L 140  Potassium 3.5 - 5.1 mmol/L 3.4(L)  Chloride 101 - 111 mmol/L 102  CO2 22 - 32 mmol/L 27  Calcium 8.9 - 10.3 mg/dL 6.1(LL)  Total Protein 6.5 - 8.1 g/dL 6.2(L)  Total Bilirubin 0.3 - 1.2 mg/dL 0.9  Alkaline Phos 38 - 126 U/L 105  AST 15 - 41 U/L 33  ALT 14 - 54 U/L 24   CBC  Latest Ref Rng & Units 11/10/2016  WBC 3.6 - 11.0 K/uL 14.3(H)  Hemoglobin 12.0 - 16.0 g/dL 15.7  Hematocrit 35.0 - 47.0 % 45.3  Platelets 150 - 440 K/uL 394      Assessment and plan- Patient is a 71 y.o. female with a history of stage I endometrioid endometrial carcinoma diagnosed in 2016 status post surgery. She did not require  any adjuvant treatment at that time. She also has a long-standing history of smoking and she is currently being referred for severe protein energy malnutrition and cachexia as well as unintentional weight loss  Patient was found to be mildly anemic during her recent hospitalization. Her hemoglobin was around 11 at that time. Today I will obtain a repeat CBC, CMP, ferritin and iron studies, B12 and folate reticulocyte count, haptoglobin, TSH, multiple myeloma panel.  Patient has a strong history of smoking as well as prior endometrial cancer. Her ongoing weight loss is concerning for an underlying malignancy and I will proceed with the CT chest abdomen and pelvis with contrast as well as bone scan to see if there is any evidence of malignancy. I will also check a CA 125 at this time.  I will see her back in 2 weeks' time to discuss the results of her blood work and scans and further management at that time    Visit Diagnosis No diagnosis found.   Dr. Randa Evens, MD, MPH [facility] Pager- [phone] 01/17/2017 2:17 PM

## 2017-01-17 ENCOUNTER — Inpatient Hospital Stay: Payer: Medicare Other | Attending: Oncology | Admitting: Oncology

## 2017-01-17 ENCOUNTER — Inpatient Hospital Stay: Payer: Medicare Other

## 2017-01-17 ENCOUNTER — Encounter: Payer: Self-pay | Admitting: Oncology

## 2017-01-17 VITALS — BP 88/56 | HR 111 | Temp 96.4°F | Resp 20 | Wt 145.0 lb

## 2017-01-17 DIAGNOSIS — Z8673 Personal history of transient ischemic attack (TIA), and cerebral infarction without residual deficits: Secondary | ICD-10-CM | POA: Insufficient documentation

## 2017-01-17 DIAGNOSIS — E43 Unspecified severe protein-calorie malnutrition: Secondary | ICD-10-CM | POA: Diagnosis not present

## 2017-01-17 DIAGNOSIS — E785 Hyperlipidemia, unspecified: Secondary | ICD-10-CM | POA: Insufficient documentation

## 2017-01-17 DIAGNOSIS — F17210 Nicotine dependence, cigarettes, uncomplicated: Secondary | ICD-10-CM | POA: Insufficient documentation

## 2017-01-17 DIAGNOSIS — M1990 Unspecified osteoarthritis, unspecified site: Secondary | ICD-10-CM | POA: Diagnosis not present

## 2017-01-17 DIAGNOSIS — Z853 Personal history of malignant neoplasm of breast: Secondary | ICD-10-CM | POA: Diagnosis not present

## 2017-01-17 DIAGNOSIS — R5383 Other fatigue: Secondary | ICD-10-CM | POA: Insufficient documentation

## 2017-01-17 DIAGNOSIS — Z8542 Personal history of malignant neoplasm of other parts of uterus: Secondary | ICD-10-CM | POA: Diagnosis not present

## 2017-01-17 DIAGNOSIS — K589 Irritable bowel syndrome without diarrhea: Secondary | ICD-10-CM | POA: Insufficient documentation

## 2017-01-17 DIAGNOSIS — J449 Chronic obstructive pulmonary disease, unspecified: Secondary | ICD-10-CM

## 2017-01-17 DIAGNOSIS — C541 Malignant neoplasm of endometrium: Secondary | ICD-10-CM

## 2017-01-17 DIAGNOSIS — D649 Anemia, unspecified: Secondary | ICD-10-CM | POA: Insufficient documentation

## 2017-01-17 DIAGNOSIS — Z7982 Long term (current) use of aspirin: Secondary | ICD-10-CM | POA: Insufficient documentation

## 2017-01-17 DIAGNOSIS — M5136 Other intervertebral disc degeneration, lumbar region: Secondary | ICD-10-CM | POA: Diagnosis not present

## 2017-01-17 DIAGNOSIS — M549 Dorsalgia, unspecified: Secondary | ICD-10-CM | POA: Diagnosis not present

## 2017-01-17 DIAGNOSIS — R634 Abnormal weight loss: Secondary | ICD-10-CM

## 2017-01-17 DIAGNOSIS — M898X9 Other specified disorders of bone, unspecified site: Secondary | ICD-10-CM | POA: Diagnosis not present

## 2017-01-17 DIAGNOSIS — R5381 Other malaise: Secondary | ICD-10-CM | POA: Diagnosis not present

## 2017-01-17 DIAGNOSIS — R64 Cachexia: Secondary | ICD-10-CM | POA: Insufficient documentation

## 2017-01-17 DIAGNOSIS — M8580 Other specified disorders of bone density and structure, unspecified site: Secondary | ICD-10-CM | POA: Diagnosis not present

## 2017-01-17 DIAGNOSIS — I10 Essential (primary) hypertension: Secondary | ICD-10-CM | POA: Diagnosis not present

## 2017-01-17 DIAGNOSIS — K219 Gastro-esophageal reflux disease without esophagitis: Secondary | ICD-10-CM | POA: Insufficient documentation

## 2017-01-17 DIAGNOSIS — R000 Tachycardia, unspecified: Secondary | ICD-10-CM | POA: Insufficient documentation

## 2017-01-17 DIAGNOSIS — Z79899 Other long term (current) drug therapy: Secondary | ICD-10-CM | POA: Insufficient documentation

## 2017-01-17 LAB — CBC WITH DIFFERENTIAL/PLATELET
BASOS ABS: 0.1 10*3/uL (ref 0–0.1)
BASOS PCT: 1 %
EOS ABS: 0.1 10*3/uL (ref 0–0.7)
Eosinophils Relative: 1 %
HCT: 35.9 % (ref 35.0–47.0)
HEMOGLOBIN: 12.1 g/dL (ref 12.0–16.0)
Lymphocytes Relative: 26 %
Lymphs Abs: 2.7 10*3/uL (ref 1.0–3.6)
MCH: 30.5 pg (ref 26.0–34.0)
MCHC: 33.7 g/dL (ref 32.0–36.0)
MCV: 90.3 fL (ref 80.0–100.0)
MONO ABS: 0.7 10*3/uL (ref 0.2–0.9)
MONOS PCT: 7 %
NEUTROS PCT: 65 %
Neutro Abs: 6.5 10*3/uL (ref 1.4–6.5)
Platelets: 425 10*3/uL (ref 150–440)
RBC: 3.98 MIL/uL (ref 3.80–5.20)
RDW: 16.3 % — AB (ref 11.5–14.5)
WBC: 10.1 10*3/uL (ref 3.6–11.0)

## 2017-01-17 LAB — IRON AND TIBC
Iron: 49 ug/dL (ref 28–170)
SATURATION RATIOS: 19 % (ref 10.4–31.8)
TIBC: 258 ug/dL (ref 250–450)
UIBC: 209 ug/dL

## 2017-01-17 LAB — LACTATE DEHYDROGENASE: LDH: 160 U/L (ref 98–192)

## 2017-01-17 LAB — COMPREHENSIVE METABOLIC PANEL
ALBUMIN: 2.4 g/dL — AB (ref 3.5–5.0)
ALT: 13 U/L — ABNORMAL LOW (ref 14–54)
AST: 20 U/L (ref 15–41)
Alkaline Phosphatase: 85 U/L (ref 38–126)
Anion gap: 6 (ref 5–15)
BUN: 14 mg/dL (ref 6–20)
CHLORIDE: 107 mmol/L (ref 101–111)
CO2: 24 mmol/L (ref 22–32)
Calcium: 8.7 mg/dL — ABNORMAL LOW (ref 8.9–10.3)
Creatinine, Ser: 0.55 mg/dL (ref 0.44–1.00)
GFR calc Af Amer: >60 mL/min (ref >60)
Glucose, Bld: 118 mg/dL — ABNORMAL HIGH (ref 65–99)
POTASSIUM: 3.9 mmol/L (ref 3.5–5.1)
SODIUM: 137 mmol/L (ref 135–145)
Total Bilirubin: 0.5 mg/dL (ref 0.3–1.2)
Total Protein: 6.5 g/dL (ref 6.5–8.1)

## 2017-01-17 LAB — VITAMIN B12: Vitamin B-12: 476 pg/mL (ref 180–914)

## 2017-01-17 LAB — RETICULOCYTES
RBC.: 3.86 MIL/uL (ref 3.80–5.20)
RETIC CT PCT: 2.4 % (ref 0.4–3.1)
Retic Count, Absolute: 92.6 10*3/uL (ref 19.0–183.0)

## 2017-01-17 LAB — FOLATE: Folate: 13.6 ng/mL (ref >5.9)

## 2017-01-17 LAB — TSH: TSH: 1.629 u[IU]/mL (ref 0.350–4.500)

## 2017-01-17 LAB — FERRITIN: FERRITIN: 161 ng/mL (ref 11–307)

## 2017-01-17 NOTE — Progress Notes (Signed)
Reports feeling extremely tired, legs are noted with increasing weakness, using walker at home, reports dizziness at some times, BP low today at 88/56 hr 111, fell at home a couple of weeks ago.

## 2017-01-18 ENCOUNTER — Telehealth: Payer: Self-pay | Admitting: *Deleted

## 2017-01-18 LAB — HAPTOGLOBIN: Haptoglobin: 277 mg/dL — ABNORMAL HIGH (ref 34–200)

## 2017-01-18 LAB — CA 125: CA 125: 91 U/mL — AB (ref 0.0–38.1)

## 2017-01-18 NOTE — Telephone Encounter (Signed)
Called HIM department at [facility]. [facility]). Was given Fax # [phone]. Faxed the request to get med. Records from her Jan hosp. Admission.

## 2017-01-19 LAB — MULTIPLE MYELOMA PANEL, SERUM
ALBUMIN SERPL ELPH-MCNC: 2.4 g/dL — AB (ref 2.9–4.4)
Albumin/Glob SerPl: 0.8 (ref 0.7–1.7)
Alpha 1: 0.3 g/dL (ref 0.0–0.4)
Alpha2 Glob SerPl Elph-Mcnc: 0.9 g/dL (ref 0.4–1.0)
B-Globulin SerPl Elph-Mcnc: 1.1 g/dL (ref 0.7–1.3)
GLOBULIN, TOTAL: 3.4 g/dL (ref 2.2–3.9)
Gamma Glob SerPl Elph-Mcnc: 1 g/dL (ref 0.4–1.8)
IGG (IMMUNOGLOBIN G), SERUM: 1030 mg/dL (ref 700–1600)
IGM, SERUM: 55 mg/dL (ref 26–217)
IgA: 387 mg/dL — ABNORMAL HIGH (ref 87–352)
Total Protein ELP: 5.8 g/dL — ABNORMAL LOW (ref 6.0–8.5)

## 2017-01-31 ENCOUNTER — Encounter
Admission: RE | Admit: 2017-01-31 | Discharge: 2017-01-31 | Disposition: A | Discharge status: Home / Self Care | Payer: Medicare Other | Source: Ambulatory Visit | Attending: Oncology | Admitting: Oncology

## 2017-01-31 ENCOUNTER — Ambulatory Visit: Payer: Medicare Other | Admitting: Oncology

## 2017-01-31 ENCOUNTER — Ambulatory Visit
Admission: RE | Admit: 2017-01-31 | Discharge: 2017-01-31 | Disposition: A | Discharge status: Home / Self Care | Payer: Medicare Other | Source: Ambulatory Visit | Attending: Oncology | Admitting: Oncology

## 2017-01-31 DIAGNOSIS — I700 Atherosclerosis of aorta: Secondary | ICD-10-CM | POA: Diagnosis not present

## 2017-01-31 DIAGNOSIS — J432 Centrilobular emphysema: Secondary | ICD-10-CM | POA: Diagnosis not present

## 2017-01-31 DIAGNOSIS — C541 Malignant neoplasm of endometrium: Secondary | ICD-10-CM | POA: Insufficient documentation

## 2017-01-31 DIAGNOSIS — I2510 Atherosclerotic heart disease of native coronary artery without angina pectoris: Secondary | ICD-10-CM | POA: Insufficient documentation

## 2017-01-31 DIAGNOSIS — M549 Dorsalgia, unspecified: Secondary | ICD-10-CM

## 2017-01-31 DIAGNOSIS — R634 Abnormal weight loss: Secondary | ICD-10-CM | POA: Diagnosis not present

## 2017-01-31 DIAGNOSIS — K5730 Diverticulosis of large intestine without perforation or abscess without bleeding: Secondary | ICD-10-CM | POA: Insufficient documentation

## 2017-01-31 DIAGNOSIS — R918 Other nonspecific abnormal finding of lung field: Secondary | ICD-10-CM | POA: Insufficient documentation

## 2017-01-31 MED ORDER — IOPAMIDOL (ISOVUE-300) INJECTION 61%
100.0000 mL | Freq: Once | INTRAVENOUS | Status: AC | PRN
  Administered 2017-01-31: 100 mL via INTRAVENOUS

## 2017-01-31 MED ORDER — TECHNETIUM TC 99M MEDRONATE IV KIT
25.0000 | PACK | Freq: Once | INTRAVENOUS | Status: AC | PRN
  Administered 2017-01-31: 23.36 via INTRAVENOUS

## 2017-02-02 ENCOUNTER — Other Ambulatory Visit: Payer: Self-pay | Admitting: *Deleted

## 2017-02-02 ENCOUNTER — Inpatient Hospital Stay: Payer: Medicare Other | Attending: Oncology | Admitting: Oncology

## 2017-02-02 ENCOUNTER — Encounter: Payer: Self-pay | Admitting: Oncology

## 2017-02-02 VITALS — BP 91/57 | HR 92 | Temp 95.5°F | Resp 18 | Wt 130.4 lb

## 2017-02-02 DIAGNOSIS — F17210 Nicotine dependence, cigarettes, uncomplicated: Secondary | ICD-10-CM | POA: Diagnosis not present

## 2017-02-02 DIAGNOSIS — I10 Essential (primary) hypertension: Secondary | ICD-10-CM | POA: Insufficient documentation

## 2017-02-02 DIAGNOSIS — R918 Other nonspecific abnormal finding of lung field: Secondary | ICD-10-CM | POA: Insufficient documentation

## 2017-02-02 DIAGNOSIS — Z7982 Long term (current) use of aspirin: Secondary | ICD-10-CM | POA: Insufficient documentation

## 2017-02-02 DIAGNOSIS — D259 Leiomyoma of uterus, unspecified: Secondary | ICD-10-CM | POA: Insufficient documentation

## 2017-02-02 DIAGNOSIS — C541 Malignant neoplasm of endometrium: Secondary | ICD-10-CM

## 2017-02-02 DIAGNOSIS — Z79899 Other long term (current) drug therapy: Secondary | ICD-10-CM

## 2017-02-02 DIAGNOSIS — R634 Abnormal weight loss: Secondary | ICD-10-CM | POA: Insufficient documentation

## 2017-02-02 DIAGNOSIS — Z8542 Personal history of malignant neoplasm of other parts of uterus: Secondary | ICD-10-CM | POA: Diagnosis present

## 2017-02-02 DIAGNOSIS — Z8673 Personal history of transient ischemic attack (TIA), and cerebral infarction without residual deficits: Secondary | ICD-10-CM | POA: Diagnosis not present

## 2017-02-02 DIAGNOSIS — R2689 Other abnormalities of gait and mobility: Secondary | ICD-10-CM | POA: Insufficient documentation

## 2017-02-02 DIAGNOSIS — J449 Chronic obstructive pulmonary disease, unspecified: Secondary | ICD-10-CM | POA: Diagnosis not present

## 2017-02-02 DIAGNOSIS — E785 Hyperlipidemia, unspecified: Secondary | ICD-10-CM | POA: Insufficient documentation

## 2017-02-02 DIAGNOSIS — M1990 Unspecified osteoarthritis, unspecified site: Secondary | ICD-10-CM | POA: Diagnosis not present

## 2017-02-02 DIAGNOSIS — R64 Cachexia: Secondary | ICD-10-CM

## 2017-02-02 DIAGNOSIS — K589 Irritable bowel syndrome without diarrhea: Secondary | ICD-10-CM | POA: Diagnosis not present

## 2017-02-02 DIAGNOSIS — M8580 Other specified disorders of bone density and structure, unspecified site: Secondary | ICD-10-CM | POA: Diagnosis not present

## 2017-02-02 DIAGNOSIS — Z9181 History of falling: Secondary | ICD-10-CM | POA: Insufficient documentation

## 2017-02-02 DIAGNOSIS — M5136 Other intervertebral disc degeneration, lumbar region: Secondary | ICD-10-CM

## 2017-02-02 DIAGNOSIS — Z853 Personal history of malignant neoplasm of breast: Secondary | ICD-10-CM | POA: Diagnosis not present

## 2017-02-02 NOTE — Progress Notes (Signed)
Hematology/Oncology Consult note [facility]  Telephone:([phone] [phone]  Patient Care Team: Renee Rival, NP as PCP - General (Nurse Practitioner) Sabino Gasser, RN as Registered Nurse Angeles Gaetana Michaelis, MD as Consulting Physician (Obstetrics and Gynecology) Boykin Nearing, MD as Referring Physician (Obstetrics and Gynecology)   Name of the patient: Michelle Hays  [id number]  1946/08/28   Date of visit: 02/02/17  Diagnosis- h/o endometrial cancer.   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 She had presented with abdominal cramping and ultrasound the mons treated thickened endometrial stripe measuring 12 mm. Pap smear showed ASCUS and endometrial biopsy showed small fragment of endometrial carcinoma of the endometrium moderately differentiated FIGOgrade 2  03/24/2015 CT scan abdomen and pelvis IMPRESSION: Known endometrial cancer is not evident on CT. Multiple calcified uterine fibroids. No findings specific for metastatic disease.  05/20/15  ROBOTIC ASSISTED TOTAL HYSTERECTOMY LAPAROSCOPIC BILATERAL SALPINGO OOPHORECTOMY, SENTINEL LYMPH NODE INJECTION AND MAPPING, BILATERAL LYMPH NODE SAMPLING, 3 mm tumor, grade 2 endometrioid, no invasion, negative adnexa, cytology, and SLNs.  Of note, she has multiple significant medical comorbidities specifically COPD and a greater than 100 pack year tobacco history.  Patient was last seen by Dr. Theora Gianotti in February 2017 and at that time she had complained about 10 pounds of unintentional weight loss. At that time she reportedly had a CT at [facility] which was reportedly negative. Patient has not followed up with our clinic since then. Currently patient states that she has lost about 20 pounds of weight over the last 6 months. She weighed 128  pounds at home and today she weighs 145 pounds from a baseline of 170 pounds. She has no appetite or energy and spends about 50% of her time in bed. She reports feeling cold. She was admitted to [facility] recently and was found to have hypotension along with low protein and low calcium. She also reports diffuse bone pain mainly in her back and lower extremities. She has smoked about 2 packs of cigarettes per day for the last 50 years and currently smokes about half a pack of cigarettes per day.  Due to concerns of ongoing weight loss patient had a repeat CT chest abdomen and pelvis on 01/31/2017 which showed no evidence of malignancy  Interval history- patient states that her diarrhea has now resolved and she better and has gained 2 pounds of weight. She continues to have problems with her balance and had a fall at home last week. She has also cut down her smoking to half a pack of cigarettes per day  ECOG PS- 2-3 Pain scale- 0  Review of systems- Review of Systems  Constitutional: Positive for weight loss. Negative for chills, fever and malaise/fatigue.  HENT: Negative for congestion, ear discharge and nosebleeds.   Eyes: Negative for blurred vision.  Respiratory: Negative for cough, hemoptysis, sputum production, shortness of breath and wheezing.   Cardiovascular: Negative for chest pain, palpitations, orthopnea and claudication.  Gastrointestinal: Negative for abdominal pain, blood in stool, constipation, diarrhea, heartburn, melena, nausea and vomiting.  Genitourinary: Negative for dysuria, flank pain, frequency, hematuria and urgency.  Musculoskeletal: Negative for back pain, joint pain and myalgias.  Skin: Negative for rash.  Neurological: Positive for weakness. Negative for dizziness, tingling, focal weakness, seizures and headaches.       Loss of balance  Endo/Heme/Allergies: Does not bruise/bleed easily.  Psychiatric/Behavioral: Negative for depression and suicidal  ideas. The patient does not have insomnia.      Current treatment- observation  Allergies  Allergen Reactions  . Sulfa Antibiotics Rash     Past Medical History:  Diagnosis Date  . Anxiety   . Arthritis   . Asthma   . Atypical squamous cell changes of undetermined significance (ASCUS) on cervical cytology with positive high risk human papilloma virus (HPV) 03/16/15  . BP (high blood pressure) 06/27/2014  . Breast cancer ([place])   . Breast cancer, left breast ([place]) 1995   Left Breast   . COPD (chronic obstructive pulmonary disease) ([place])   . DDD (degenerative disc disease), lumbar   . Depression   . Diabetes mellitus without complication [facility])    Patient takes Januvia  . Endometrial cancer determined by uterine biopsy ([place]) 2016  . GERD (gastroesophageal reflux disease)   . Heart murmur   . Hyperlipidemia   . Hypertension   . IBS (irritable bowel syndrome)   . Migraine   . Osteopenia   . Stroke [facility])      Past Surgical History:  Procedure Laterality Date  . ABDOMINAL HYSTERECTOMY    . BREAST LUMPECTOMY  2012   [place]-bilateral breast lumpectomy  . CATARACT EXTRACTION  2013  . CHOLECYSTECTOMY    . CHOLECYSTECTOMY, LAPAROSCOPIC    . COLONOSCOPY WITH PROPOFOL N/A 07/06/2016   Procedure: COLONOSCOPY WITH PROPOFOL;  Surgeon: Lollie Sails, MD;  Location: [facility] ENDOSCOPY;  Service: Endoscopy;  Laterality: N/A;  . ESOPHAGOGASTRODUODENOSCOPY  03/02/2008, 10/12/2010, 03/28/2013  . ESOPHAGOGASTRODUODENOSCOPY (EGD) WITH PROPOFOL N/A 07/06/2016   Procedure: ESOPHAGOGASTRODUODENOSCOPY (EGD) WITH PROPOFOL;  Surgeon: Lollie Sails, MD;  Location: [facility] ENDOSCOPY;  Service: Endoscopy;  Laterality: N/A;  . EYE SURGERY    . LAPAROSCOPIC BILATERAL SALPINGO OOPHERECTOMY Bilateral 05/20/2015   Procedure: LAPAROSCOPIC BILATERAL SALPINGO OOPHORECTOMY;  Surgeon: Gillis Ends, MD;  Location: [facility] ORS;  Service: Gynecology;  Laterality: Bilateral;  . ROBOTIC ASSISTED TOTAL  HYSTERECTOMY N/A 05/20/2015   Procedure: ROBOTIC ASSISTED TOTAL HYSTERECTOMY;  Surgeon: Gillis Ends, MD;  Location: [facility] ORS;  Service: Gynecology;  Laterality: N/A;    Social History   Social History  . Marital status: Married    Spouse name: N/A  . Number of children: N/A  . Years of education: N/A   Occupational History  . Not on file.   Social History Main Topics  . Smoking status: Current Every Day Smoker    Packs/day: 2.00    Years: 52.00    Types: Cigarettes  . Smokeless tobacco: Never Used  . Alcohol use No  . Drug use: No  . Sexual activity: Not on file   Other Topics Concern  . Not on file   Social History Narrative  . No narrative on file    Family History  Problem Relation Age of Onset  . Lung cancer Mother     BONE, BRAIN METS  . Brain cancer Mother   . Diabetes Mellitus II Mother   . Hypertension Mother     X 2 BROTHERS  . Breast cancer Maternal Aunt   . Coronary artery disease Father   . Hyperlipidemia Brother     X 2 BROTHERS  . Hypertension Brother   . Hypertension Sister      Current Outpatient Prescriptions:  .  albuterol (PROVENTIL HFA;VENTOLIN HFA) 108 (90 BASE) MCG/ACT inhaler, Inhale 2 puffs into the lungs every 4 (four) hours as needed  for wheezing. For wheezing, Disp: , Rfl:  .  ARIPiprazole (ABILIFY) 5 MG tablet, Take 5 mg by mouth daily., Disp: , Rfl:  .  aspirin EC 81 MG tablet, Take 1 tablet by mouth daily., Disp: , Rfl:  .  atorvastatin (LIPITOR) 40 MG tablet, Take 40 mg by mouth daily., Disp: , Rfl:  .  calcium-vitamin D (OSCAL WITH D) 500-200 MG-UNIT tablet, Take 1 tablet by mouth., Disp: , Rfl:  .  cholecalciferol (VITAMIN D) 400 units TABS tablet, Take 1,000 Units by mouth daily., Disp: , Rfl:  .  dicyclomine (BENTYL) 10 MG capsule, Take 10 mg by mouth every 4 (four) hours as needed for spasms., Disp: , Rfl:  .  docusate sodium (COLACE) 100 MG capsule, Take 1 capsule (100 mg total) by mouth 2 (two) times daily.,  Disp: 30 capsule, Rfl: 0 .  DULoxetine (CYMBALTA) 60 MG capsule, Take 60 mg by mouth daily., Disp: , Rfl:  .  gabapentin (NEURONTIN) 300 MG capsule, Take 300 mg by mouth 2 (two) times daily. , Disp: , Rfl:  .  HYDROcodone-acetaminophen (NORCO) 10-325 MG tablet, Take 1 tablet by mouth every 6 (six) hours as needed., Disp: , Rfl:  .  loperamide (IMODIUM) 2 MG capsule, Take by mouth as needed for diarrhea or loose stools., Disp: , Rfl:  .  LORazepam (ATIVAN) 1 MG tablet, Take 1 mg by mouth at bedtime., Disp: , Rfl:  .  magnesium oxide (MAG-OX) 400 MG tablet, Take 400 mg by mouth daily., Disp: , Rfl:  .  methylPREDNISolone (MEDROL) 4 MG tablet, Take 4 mg by mouth daily., Disp: , Rfl:  .  naproxen (NAPROSYN) 500 MG tablet, Take 1 tablet (500 mg total) by mouth 2 (two) times daily with a meal., Disp: 60 tablet, Rfl: 1 .  omeprazole (PRILOSEC) 20 MG capsule, Take 20 mg by mouth 2 (two) times daily., Disp: , Rfl:  .  ondansetron (ZOFRAN) 4 MG tablet, Take 1 tablet (4 mg total) by mouth every 6 (six) hours as needed for nausea. (Patient taking differently: Take 8 mg by mouth every 8 (eight) hours as needed for nausea. ), Disp: 20 tablet, Rfl: 0 .  sitaGLIPtin (JANUVIA) 100 MG tablet, Take 100 mg by mouth daily., Disp: , Rfl:  .  OLANZapine (ZYPREXA) 2.5 MG tablet, Take 2.5 mg by mouth daily., Disp: , Rfl:   Physical exam:  Vitals:   02/02/17 1341  BP: (!) 91/57  Pulse: 92  Resp: 18  Temp: (!) 95.5 F (35.3 C)  Weight: 130 lb 6.4 oz (59.1 kg)   Physical Exam  Constitutional: She is oriented to person, place, and time.  Patient is thin and cachectic. She is sitting in a wheelchair  HENT:  Head: Normocephalic and atraumatic.  Eyes: EOM are normal. Pupils are equal, round, and reactive to light.  Neck: Normal range of motion.  Cardiovascular: Normal rate, regular rhythm and normal heart sounds.   Pulmonary/Chest: Effort normal and breath sounds normal.  Abdominal: Soft. Bowel sounds are normal.    Neurological: She is alert and oriented to person, place, and time.  Skin: Skin is warm and dry.     CMP Latest Ref Rng & Units 01/17/2017  Glucose 65 - 99 mg/dL 118(H)  BUN 6 - 20 mg/dL 14  Creatinine 0.44 - 1.00 mg/dL 0.55  Sodium 135 - 145 mmol/L 137  Potassium 3.5 - 5.1 mmol/L 3.9  Chloride 101 - 111 mmol/L 107  CO2 22 - 32 mmol/L 24  Calcium 8.9 - 10.3 mg/dL 8.7(L)  Total Protein 6.5 - 8.1 g/dL 6.5  Total Bilirubin 0.3 - 1.2 mg/dL 0.5  Alkaline Phos 38 - 126 U/L 85  AST 15 - 41 U/L 20  ALT 14 - 54 U/L 13(L)   CBC Latest Ref Rng & Units 01/17/2017  WBC 3.6 - 11.0 K/uL 10.1  Hemoglobin 12.0 - 16.0 g/dL 12.1  Hematocrit 35.0 - 47.0 % 35.9  Platelets 150 - 440 K/uL 425    No images are attached to the encounter.  Ct Chest W Contrast  Result Date: 01/31/2017 CLINICAL DATA:  Abnormal weight loss. History of breast cancer and uterine cancer remotely EXAM: CT CHEST, ABDOMEN, AND PELVIS WITH CONTRAST TECHNIQUE: Multidetector CT imaging of the chest, abdomen and pelvis was performed following the standard protocol during bolus administration of intravenous contrast. CONTRAST:  14mL ISOVUE-300 IOPAMIDOL (ISOVUE-300) INJECTION 61% COMPARISON:  CT abdomen pelvis 03/24/2015. FINDINGS: CT CHEST FINDINGS Cardiovascular: Heart is normal size. Aorta is normal caliber. Moderate coronary artery calcifications, most pronounced in the left anterior descending and right coronary artery. Moderate aortic calcifications. Mediastinum/Nodes: No mediastinal, hilar, or axillary adenopathy. Lungs/Pleura: Mild centrilobular emphysema. Biapical scarring. Small nodule posteriorly in the right lower lobe measures 5 mm on image 82. Small subpleural nodule in the right lower lobe on image 117, 2 to 3 mm. Clustered nodules in the left lower lobe with tree-in-bud pattern, an area of mucous plugging in the airways. 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 Separate small nodule in the left lower lobe  on image 109 measures 5 mm. No pleural effusions. Musculoskeletal: Chest wall soft tissues are unremarkable. No acute bony abnormality. CT ABDOMEN PELVIS FINDINGS Hepatobiliary: No focal liver abnormality is seen. Status post cholecystectomy. No biliary dilatation. Suspect mild fatty infiltration of the liver diffusely. Pancreas: No focal abnormality or ductal dilatation. Spleen: No focal abnormality.  Normal size. Adrenals/Urinary Tract: No adrenal abnormality. No focal renal abnormality. No stones or hydronephrosis. Urinary bladder is unremarkable. Stomach/Bowel: Stomach and small bowel grossly unremarkable. Scattered colonic diverticulosis. No active diverticulitis. Vascular/Lymphatic: Diffuse aortic and iliac calcifications. No aneurysm. No adenopathy. Reproductive: Uterus and adnexa unremarkable.  No mass. Other: No free fluid or free air. Musculoskeletal: No acute or focal bony abnormality. IMPRESSION: No acute findings in the chest, abdomen or pelvis. Coronary artery disease, aortic atherosclerosis. Mild centrilobular emphysema. Scattered small pulmonary nodules, nonspecific. Non-contrast chest CT can be considered in 6-12 months if patient is high-risk. This recommendation follows the consensus statement: Guidelines for Management of Incidental Pulmonary Nodules Detected on CT Images: From the Fleischner Society 2017; Radiology 2017; 284:228-243. Suspect mild fatty infiltration of the liver. Colonic diverticulosis. Electronically Signed   By: Rolm Baptise M.D.   On: 01/31/2017 13:33   Nm Bone Scan Whole Body  Result Date: 01/31/2017 CLINICAL DATA:  History of endometrial and breast malignancy. The patient is has a history of diffuse mid back pain and weight loss. The patient also fell yesterday. No significant discomfort in the skeleton today. EXAM: NUCLEAR MEDICINE WHOLE BODY BONE SCAN TECHNIQUE: Whole body anterior and posterior images were obtained approximately 3 hours after intravenous injection of  radiopharmaceutical. RADIOPHARMACEUTICALS:  23.36 mCi Technetium-37m MDP IV COMPARISON:  None in PACs FINDINGS: There is adequate uptake of the radiopharmaceutical by the skeleton. There is adequate soft tissue clearance and renal activity. Activity within the calvarium is normal. Activity within the spine and pelvis is normal. There is mildly increased uptake in the anterior aspect of the left first  rib. Subtle increased uptake in the posterior aspects of the fourth and fifth left ribs. Activity within the upper and lower extremities is within the limits of normal. IMPRESSION: There are no findings suspicious for metastatic disease. Subtle increased uptake in the anterior aspect of the left first in the posterior aspects of the left fourth and fifth ribs may reflect the sequelae of old trauma. No abnormalities were observed here on today's chest CT scan. Electronically Signed   By: David  Martinique M.D.   On: 01/31/2017 14:51   Ct Abdomen Pelvis W Contrast  Result Date: 01/31/2017 CLINICAL DATA:  Abnormal weight loss. History of breast cancer and uterine cancer remotely EXAM: CT CHEST, ABDOMEN, AND PELVIS WITH CONTRAST TECHNIQUE: Multidetector CT imaging of the chest, abdomen and pelvis was performed following the standard protocol during bolus administration of intravenous contrast. CONTRAST:  139mL ISOVUE-300 IOPAMIDOL (ISOVUE-300) INJECTION 61% COMPARISON:  CT abdomen pelvis 03/24/2015. FINDINGS: CT CHEST FINDINGS Cardiovascular: Heart is normal size. Aorta is normal caliber. Moderate coronary artery calcifications, most pronounced in the left anterior descending and right coronary artery. Moderate aortic calcifications. Mediastinum/Nodes: No mediastinal, hilar, or axillary adenopathy. Lungs/Pleura: Mild centrilobular emphysema. Biapical scarring. Small nodule posteriorly in the right lower lobe measures 5 mm on image 82. Small subpleural nodule in the right lower lobe on image 117, 2 to 3 mm. Clustered nodules  in the left lower lobe with tree-in-bud pattern, an area of mucous plugging in the airways. 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 Separate small nodule in the left lower lobe on image 109 measures 5 mm. No pleural effusions. Musculoskeletal: Chest wall soft tissues are unremarkable. No acute bony abnormality. CT ABDOMEN PELVIS FINDINGS Hepatobiliary: No focal liver abnormality is seen. Status post cholecystectomy. No biliary dilatation. Suspect mild fatty infiltration of the liver diffusely. Pancreas: No focal abnormality or ductal dilatation. Spleen: No focal abnormality.  Normal size. Adrenals/Urinary Tract: No adrenal abnormality. No focal renal abnormality. No stones or hydronephrosis. Urinary bladder is unremarkable. Stomach/Bowel: Stomach and small bowel grossly unremarkable. Scattered colonic diverticulosis. No active diverticulitis. Vascular/Lymphatic: Diffuse aortic and iliac calcifications. No aneurysm. No adenopathy. Reproductive: Uterus and adnexa unremarkable.  No mass. Other: No free fluid or free air. Musculoskeletal: No acute or focal bony abnormality. IMPRESSION: No acute findings in the chest, abdomen or pelvis. Coronary artery disease, aortic atherosclerosis. Mild centrilobular emphysema. Scattered small pulmonary nodules, nonspecific. Non-contrast chest CT can be considered in 6-12 months if patient is high-risk. This recommendation follows the consensus statement: Guidelines for Management of Incidental Pulmonary Nodules Detected on CT Images: From the Fleischner Society 2017; Radiology 2017; 284:228-243. Suspect mild fatty infiltration of the liver. Colonic diverticulosis. Electronically Signed   By: Rolm Baptise M.D.   On: 01/31/2017 13:33     Assessment and plan- Patient is a 71 y.o. female with a history of stage I grade 2 endometrioid endometrial cancer in 2016 status post surgery who is now being seen for abnormal weight loss  I discussed the results of the CT  scan which did not show any evidence of malignancy that can explain her weight loss. Also blood work from 01/17/2017 showed that she had a normal CBC. Her iron studies B12, folate, TSH was within normal limits. Myeloma panel showed no evidence of monoclonal protein. LDH was normal. CMP was within normal limits. Nothing on the basis of her scans and blood work and explain her ongoing weight loss although she does admit that her appetite is improving and she is slowly  gaining weight.  She was found to have subcentimeter lung nodules on CT chest and given her strong history of smoking I will repeat his CT chest without contrast in 6 months time.  Patient was found to have an elevated CA 125 of 91. I do not have any prior values for comparison. Given that her scans are negative I'm inclined to monitor this in 6 months as well. I have also encouraged her to follow-up with Dr. Theora Gianotti.  Patient is also trying to quit smoking on her own at this time   Patient needs to f/u with her pcp regarding her balance issues and can be referred by them to neurology if need be  Visit Diagnosis 1. Endometrial cancer ([place])   2. Pulmonary nodules   3. Weight loss, abnormal      Dr. Randa Evens, MD, MPH [facility] at [facility] Pager- [phone] 02/02/2017 2:08 PM

## 2017-02-02 NOTE — Progress Notes (Signed)
Here for follow up

## 2017-02-08 ENCOUNTER — Inpatient Hospital Stay: Payer: Medicare Other

## 2017-08-02 ENCOUNTER — Ambulatory Visit
Admission: RE | Admit: 2017-08-02 | Discharge: 2017-08-02 | Disposition: A | Discharge status: Home / Self Care | Payer: Medicare Other | Source: Ambulatory Visit | Attending: Oncology | Admitting: Oncology

## 2017-08-02 DIAGNOSIS — C541 Malignant neoplasm of endometrium: Secondary | ICD-10-CM | POA: Diagnosis not present

## 2017-08-02 DIAGNOSIS — J439 Emphysema, unspecified: Secondary | ICD-10-CM | POA: Insufficient documentation

## 2017-08-02 DIAGNOSIS — R918 Other nonspecific abnormal finding of lung field: Secondary | ICD-10-CM | POA: Insufficient documentation

## 2017-08-02 DIAGNOSIS — I700 Atherosclerosis of aorta: Secondary | ICD-10-CM | POA: Insufficient documentation

## 2017-08-02 DIAGNOSIS — I2510 Atherosclerotic heart disease of native coronary artery without angina pectoris: Secondary | ICD-10-CM | POA: Diagnosis not present

## 2017-08-03 ENCOUNTER — Inpatient Hospital Stay: Payer: Medicare Other

## 2017-08-03 ENCOUNTER — Inpatient Hospital Stay: Payer: Medicare Other | Admitting: Oncology

## 2017-08-04 ENCOUNTER — Other Ambulatory Visit: Payer: Medicare Other

## 2017-08-04 ENCOUNTER — Ambulatory Visit: Payer: Medicare Other | Admitting: Oncology

## 2017-08-15 ENCOUNTER — Inpatient Hospital Stay: Payer: Medicare Other

## 2017-08-15 ENCOUNTER — Inpatient Hospital Stay: Payer: Medicare Other | Attending: Oncology | Admitting: Oncology

## 2017-08-15 ENCOUNTER — Encounter: Payer: Self-pay | Admitting: Oncology

## 2017-08-15 VITALS — BP 133/70 | HR 76 | Temp 96.6°F | Resp 18 | Wt 133.8 lb

## 2017-08-15 DIAGNOSIS — R5383 Other fatigue: Secondary | ICD-10-CM | POA: Diagnosis not present

## 2017-08-15 DIAGNOSIS — Z7982 Long term (current) use of aspirin: Secondary | ICD-10-CM | POA: Insufficient documentation

## 2017-08-15 DIAGNOSIS — C541 Malignant neoplasm of endometrium: Secondary | ICD-10-CM

## 2017-08-15 DIAGNOSIS — Z8542 Personal history of malignant neoplasm of other parts of uterus: Secondary | ICD-10-CM | POA: Diagnosis not present

## 2017-08-15 DIAGNOSIS — R634 Abnormal weight loss: Secondary | ICD-10-CM | POA: Insufficient documentation

## 2017-08-15 DIAGNOSIS — M1990 Unspecified osteoarthritis, unspecified site: Secondary | ICD-10-CM

## 2017-08-15 DIAGNOSIS — K219 Gastro-esophageal reflux disease without esophagitis: Secondary | ICD-10-CM | POA: Insufficient documentation

## 2017-08-15 DIAGNOSIS — Z8673 Personal history of transient ischemic attack (TIA), and cerebral infarction without residual deficits: Secondary | ICD-10-CM | POA: Insufficient documentation

## 2017-08-15 DIAGNOSIS — J449 Chronic obstructive pulmonary disease, unspecified: Secondary | ICD-10-CM | POA: Diagnosis not present

## 2017-08-15 DIAGNOSIS — R918 Other nonspecific abnormal finding of lung field: Secondary | ICD-10-CM | POA: Diagnosis not present

## 2017-08-15 DIAGNOSIS — D259 Leiomyoma of uterus, unspecified: Secondary | ICD-10-CM | POA: Diagnosis not present

## 2017-08-15 DIAGNOSIS — R5381 Other malaise: Secondary | ICD-10-CM | POA: Diagnosis not present

## 2017-08-15 DIAGNOSIS — M8580 Other specified disorders of bone density and structure, unspecified site: Secondary | ICD-10-CM

## 2017-08-15 DIAGNOSIS — R911 Solitary pulmonary nodule: Secondary | ICD-10-CM

## 2017-08-15 DIAGNOSIS — Z79899 Other long term (current) drug therapy: Secondary | ICD-10-CM | POA: Insufficient documentation

## 2017-08-15 DIAGNOSIS — F17210 Nicotine dependence, cigarettes, uncomplicated: Secondary | ICD-10-CM | POA: Diagnosis not present

## 2017-08-15 LAB — COMPREHENSIVE METABOLIC PANEL
ALK PHOS: 71 U/L (ref 38–126)
ALT: 12 U/L — AB (ref 14–54)
AST: 16 U/L (ref 15–41)
Albumin: 3.1 g/dL — ABNORMAL LOW (ref 3.5–5.0)
Anion gap: 7 (ref 5–15)
BILIRUBIN TOTAL: 0.2 mg/dL — AB (ref 0.3–1.2)
BUN: 8 mg/dL (ref 6–20)
CALCIUM: 8.7 mg/dL — AB (ref 8.9–10.3)
CO2: 28 mmol/L (ref 22–32)
CREATININE: 0.65 mg/dL (ref 0.44–1.00)
Chloride: 104 mmol/L (ref 101–111)
Glucose, Bld: 102 mg/dL — ABNORMAL HIGH (ref 65–99)
Potassium: 3.4 mmol/L — ABNORMAL LOW (ref 3.5–5.1)
Sodium: 139 mmol/L (ref 135–145)
TOTAL PROTEIN: 6.6 g/dL (ref 6.5–8.1)

## 2017-08-15 LAB — CBC WITH DIFFERENTIAL/PLATELET
BASOS ABS: 0.1 10*3/uL (ref 0–0.1)
Basophils Relative: 1 %
EOS PCT: 2 %
Eosinophils Absolute: 0.2 10*3/uL (ref 0–0.7)
HCT: 39.5 % (ref 35.0–47.0)
Hemoglobin: 13.3 g/dL (ref 12.0–16.0)
LYMPHS PCT: 29 %
Lymphs Abs: 2.4 10*3/uL (ref 1.0–3.6)
MCH: 28.3 pg (ref 26.0–34.0)
MCHC: 33.8 g/dL (ref 32.0–36.0)
MCV: 83.8 fL (ref 80.0–100.0)
Monocytes Absolute: 0.5 10*3/uL (ref 0.2–0.9)
Monocytes Relative: 6 %
Neutro Abs: 5.3 10*3/uL (ref 1.4–6.5)
Neutrophils Relative %: 62 %
Platelets: 224 10*3/uL (ref 150–440)
RBC: 4.71 MIL/uL (ref 3.80–5.20)
RDW: 14.9 % — ABNORMAL HIGH (ref 11.5–14.5)
WBC: 8.4 10*3/uL (ref 3.6–11.0)

## 2017-08-15 NOTE — Progress Notes (Signed)
Hematology/Oncology Consult note [facility]  Telephone:([phone] [phone]  Patient Care Team: Renee Rival, NP as PCP - General (Nurse Practitioner) Sabino Gasser, RN as Registered Nurse Gillis Ends, MD as Consulting Physician (Obstetrics and Gynecology) Schermerhorn, Gwen Her, MD as Referring Physician (Obstetrics and Gynecology)   Name of the patient: Michelle Hays  [id number]  [birth year]   Date of visit: 08/15/17  Diagnosis- 1. h/o endometrial cancer.   2. 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 She had presented with abdominal cramping and ultrasound the mons treated thickened endometrial stripe measuring 12 mm. Pap smear showed ASCUS and endometrial biopsy showed small fragment of endometrial carcinoma of the endometrium moderately differentiated FIGOgrade 2  03/24/2015 CT scan abdomen and pelvis IMPRESSION: Known endometrial cancer is not evident on CT. Multiple calcified uterine fibroids. No findings specific for metastatic disease.  05/20/15  ROBOTIC ASSISTED TOTAL HYSTERECTOMY LAPAROSCOPIC BILATERAL SALPINGO OOPHORECTOMY, SENTINEL LYMPH NODE INJECTION AND MAPPING, BILATERAL LYMPH NODE SAMPLING, 3 mm tumor, grade 2 endometrioid, no invasion, negative adnexa, cytology, and SLNs.  Of note, she has multiple significant medical comorbidities specifically COPD and a greater than 100 pack year tobacco history.  Patient was last seen by Dr. Theora Gianotti in February 2017 and at that time she had complained about 10 pounds of unintentional weight loss. At that time she reportedly had a CT at [facility] which was reportedly negative.Patient has not followed up with our clinic since then. Currently patient states that she has lost about 20 pounds of weight over  the last 6 months. She weighed 128 pounds at home and today she weighs 145 pounds from a baseline of 170 pounds. She has no appetite or energy and spends about 50% of her time in bed. She reports feeling cold. She was admitted to [facility] recently and was found to have hypotension along with low protein and low calcium. She also reports diffuse bone pain mainly in her back and lower extremities. She has smoked about 2 packs of cigarettes per day for the last 50 years and currently smokes about half a pack of cigarettes per day.  Due to concerns of ongoing weight loss patient had a repeat CT chest abdomen and pelvis on 01/31/2017 which showed no evidence of malignancy  Interval history- her appetite is improving and she is slowly gaining weight. Denies any abdominal pain, bloating, vaginal bleeding or discharge. She continues to smoke and is not interested in quitting  ECOG PS- 1 Pain scale- 0   Review of systems- Review of Systems  Constitutional: Positive for malaise/fatigue. Negative for chills, fever and weight loss.  HENT: Negative for congestion, ear discharge and nosebleeds.   Eyes: Negative for blurred vision.  Respiratory: Negative for cough, hemoptysis, sputum production, shortness of breath and wheezing.   Cardiovascular: Negative for chest pain, palpitations, orthopnea and claudication.  Gastrointestinal: Negative for abdominal pain, blood in stool, constipation, diarrhea, heartburn, melena, nausea and vomiting.  Genitourinary: Negative for dysuria, flank pain, frequency, hematuria and urgency.  Musculoskeletal: Negative for back pain, joint pain and myalgias.  Skin: Negative for rash.  Neurological: Negative for dizziness, tingling, focal weakness, seizures, weakness and headaches.  Endo/Heme/Allergies: Does not bruise/bleed easily.  Psychiatric/Behavioral: Negative for depression and suicidal ideas. The patient does not have insomnia.  Allergies    Allergen Reactions  . Sulfa Antibiotics Rash     Past Medical History:  Diagnosis Date  . Anxiety   . Arthritis   . Asthma   . Atypical squamous cell changes of undetermined significance (ASCUS) on cervical cytology with positive high risk human papilloma virus (HPV) 03/16/15  . BP (high blood pressure) 06/27/2014  . Breast cancer ([place])   . Breast cancer, left breast ([place]) 1995   Left Breast   . COPD (chronic obstructive pulmonary disease) ([place])   . DDD (degenerative disc disease), lumbar   . Depression   . Diabetes mellitus without complication [facility])    Patient takes Januvia  . Endometrial cancer determined by uterine biopsy ([place]) 2016  . GERD (gastroesophageal reflux disease)   . Heart murmur   . Hyperlipidemia   . Hypertension   . IBS (irritable bowel syndrome)   . Migraine   . Osteopenia   . Stroke [facility])      Past Surgical History:  Procedure Laterality Date  . ABDOMINAL HYSTERECTOMY    . BREAST LUMPECTOMY  2012   [place]-bilateral breast lumpectomy  . CATARACT EXTRACTION  2013  . CHOLECYSTECTOMY    . CHOLECYSTECTOMY, LAPAROSCOPIC    . COLONOSCOPY WITH PROPOFOL N/A 07/06/2016   Procedure: COLONOSCOPY WITH PROPOFOL;  Surgeon: Lollie Sails, MD;  Location: [facility] ENDOSCOPY;  Service: Endoscopy;  Laterality: N/A;  . ESOPHAGOGASTRODUODENOSCOPY  03/02/2008, 10/12/2010, 03/28/2013  . ESOPHAGOGASTRODUODENOSCOPY (EGD) WITH PROPOFOL N/A 07/06/2016   Procedure: ESOPHAGOGASTRODUODENOSCOPY (EGD) WITH PROPOFOL;  Surgeon: Lollie Sails, MD;  Location: [facility] ENDOSCOPY;  Service: Endoscopy;  Laterality: N/A;  . EYE SURGERY    . LAPAROSCOPIC BILATERAL SALPINGO OOPHERECTOMY Bilateral 05/20/2015   Procedure: LAPAROSCOPIC BILATERAL SALPINGO OOPHORECTOMY;  Surgeon: Gillis Ends, MD;  Location: [facility] ORS;  Service: Gynecology;  Laterality: Bilateral;  . ROBOTIC ASSISTED TOTAL HYSTERECTOMY N/A 05/20/2015   Procedure: ROBOTIC ASSISTED TOTAL HYSTERECTOMY;  Surgeon: Gillis Ends, MD;  Location: [facility] ORS;  Service: Gynecology;  Laterality: N/A;    Social History   Social History  . Marital status: Married    Spouse name: N/A  . Number of children: N/A  . Years of education: N/A   Occupational History  . Not on file.   Social History Main Topics  . Smoking status: Current Every Day Smoker    Packs/day: 2.00    Years: 52.00    Types: Cigarettes  . Smokeless tobacco: Never Used  . Alcohol use No  . Drug use: No  . Sexual activity: Not on file   Other Topics Concern  . Not on file   Social History Narrative  . No narrative on file    Family History  Problem Relation Age of Onset  . Lung cancer Mother        BONE, BRAIN METS  . Brain cancer Mother   . Diabetes Mellitus II Mother   . Hypertension Mother        X 2 BROTHERS  . Breast cancer Maternal Aunt   . Coronary artery disease Father   . Hyperlipidemia Brother        X 2 BROTHERS  . Hypertension Brother   . Hypertension Sister      Current Outpatient Prescriptions:  .  ARIPiprazole (ABILIFY) 5 MG tablet, Take 5 mg by mouth daily., Disp: , Rfl:  .  aspirin EC 81 MG tablet, Take 1 tablet by mouth daily., Disp: , Rfl:  .  atorvastatin (LIPITOR) 40 MG tablet,  Take 40 mg by mouth daily., Disp: , Rfl:  .  cholecalciferol (VITAMIN D) 400 units TABS tablet, Take 1,000 Units by mouth daily., Disp: , Rfl:  .  dicyclomine (BENTYL) 10 MG capsule, Take 10 mg by mouth every 4 (four) hours as needed for spasms. , Disp: , Rfl:  .  gabapentin (NEURONTIN) 300 MG capsule, Take 300 mg by mouth 2 (two) times daily. , Disp: , Rfl:  .  HYDROcodone-acetaminophen (NORCO) 10-325 MG tablet, Take 1 tablet by mouth every 6 (six) hours as needed., Disp: , Rfl:  .  LORazepam (ATIVAN) 1 MG tablet, Take 1 mg by mouth at bedtime., Disp: , Rfl:  .  magnesium oxide (MAG-OX) 400 MG tablet, Take 400 mg by mouth daily., Disp: , Rfl:  .  omeprazole (PRILOSEC) 20 MG capsule, Take 20 mg by mouth 2 (two) times daily.,  Disp: , Rfl:  .  sitaGLIPtin (JANUVIA) 100 MG tablet, Take 100 mg by mouth daily., Disp: , Rfl:  .  sucralfate (CARAFATE) 1 g tablet, Take 1 g by mouth 4 (four) times daily., Disp: , Rfl:  .  tiotropium (SPIRIVA) 18 MCG inhalation capsule, Place 18 mcg into inhaler and inhale daily., Disp: , Rfl:  .  albuterol (PROVENTIL HFA;VENTOLIN HFA) 108 (90 BASE) MCG/ACT inhaler, Inhale 2 puffs into the lungs every 4 (four) hours as needed for wheezing. For wheezing, Disp: , Rfl:  .  calcium-vitamin D (OSCAL WITH D) 500-200 MG-UNIT tablet, Take 1 tablet by mouth., Disp: , Rfl:  .  docusate sodium (COLACE) 100 MG capsule, Take 1 capsule (100 mg total) by mouth 2 (two) times daily. (Patient not taking: Reported on 08/15/2017), Disp: 30 capsule, Rfl: 0 .  DULoxetine (CYMBALTA) 60 MG capsule, Take 60 mg by mouth daily., Disp: , Rfl:  .  loperamide (IMODIUM) 2 MG capsule, Take by mouth as needed for diarrhea or loose stools., Disp: , Rfl:  .  methylPREDNISolone (MEDROL) 4 MG tablet, Take 4 mg by mouth daily., Disp: , Rfl:  .  naproxen (NAPROSYN) 500 MG tablet, Take 1 tablet (500 mg total) by mouth 2 (two) times daily with a meal. (Patient not taking: Reported on 08/15/2017), Disp: 60 tablet, Rfl: 1 .  OLANZapine (ZYPREXA) 2.5 MG tablet, Take 2.5 mg by mouth daily., Disp: , Rfl:  .  ondansetron (ZOFRAN) 4 MG tablet, Take 1 tablet (4 mg total) by mouth every 6 (six) hours as needed for nausea. (Patient not taking: Reported on 08/15/2017), Disp: 20 tablet, Rfl: 0  Physical exam:  Vitals:   08/15/17 1503  BP: 133/70  Pulse: 76  Resp: 18  Temp: (!) 96.6 F (35.9 C)  TempSrc: Tympanic  Weight: 133 lb 12.8 oz (60.7 kg)   Physical Exam  Constitutional: She is oriented to person, place, and time and well-developed, well-nourished, and in no distress.  HENT:  Head: Normocephalic and atraumatic.  Eyes: Pupils are equal, round, and reactive to light. EOM are normal.  Neck: Normal range of motion.  Cardiovascular:  Normal rate, regular rhythm and normal heart sounds.   Pulmonary/Chest: Effort normal and breath sounds normal.  Abdominal: Soft. Bowel sounds are normal.  Neurological: She is alert and oriented to person, place, and time.  Skin: Skin is warm and dry.     CMP Latest Ref Rng & Units 08/15/2017  Glucose 65 - 99 mg/dL 102(H)  BUN 6 - 20 mg/dL 8  Creatinine 0.44 - 1.00 mg/dL 0.65  Sodium 135 - 145 mmol/L 139  Potassium 3.5 - 5.1 mmol/L 3.4(L)  Chloride 101 - 111 mmol/L 104  CO2 22 - 32 mmol/L 28  Calcium 8.9 - 10.3 mg/dL 8.7(L)  Total Protein 6.5 - 8.1 g/dL 6.6  Total Bilirubin 0.3 - 1.2 mg/dL 0.2(L)  Alkaline Phos 38 - 126 U/L 71  AST 15 - 41 U/L 16  ALT 14 - 54 U/L 12(L)   CBC Latest Ref Rng & Units 08/15/2017  WBC 3.6 - 11.0 K/uL 8.4  Hemoglobin 12.0 - 16.0 g/dL 13.3  Hematocrit 35.0 - 47.0 % 39.5  Platelets 150 - 440 K/uL 224    No images are attached to the encounter.  Ct Chest Wo Contrast  Result Date: 08/02/2017 CLINICAL DATA:  Endometrial carcinoma. EXAM: CT CHEST WITHOUT CONTRAST TECHNIQUE: Multidetector CT imaging of the chest was performed following the standard protocol without IV contrast. COMPARISON:  01/31/17 FINDINGS: Cardiovascular: The heart size appears within normal limits. Aortic atherosclerosis. Calcifications within the RCA, LAD and left circumflex coronary arteries noted. Mediastinum/Nodes: The trachea appears patent and is midline. Unremarkable appearance of the esophagus. No mediastinal or hilar adenopathy identified. Lungs/Pleura: Mild to moderate changes of centrilobular emphysema identified. Diffuse bronchial wall thickening noted. Previous 5 mm right lower lobe pulmonary nodule appears less solid on today's study measuring 3 mm, image 85 of series 3. Stable anterior right upper lobe lung nodule measuring 3 mm, image 42 of series 3. Subpleural nodule in the left lower lobe is unchanged measuring 3 mm, image 117 of series 3. No new or enlarging pulmonary nodules  or masses. Upper Abdomen: No acute abnormality.  Aortic atherosclerosis noted. Musculoskeletal: Degenerative disc disease identified within the thoracic spine. No aggressive lytic or sclerotic bone lesions. IMPRESSION: 1. No acute cardiopulmonary abnormalities. 2. Aortic Atherosclerosis (ICD10-I70.0) and Emphysema (ICD10-J43.9). 3. Multi vessel coronary artery calcifications. 4. Small nonspecific pulmonary nodules are unchanged when compared with 01/31/2017. Electronically Signed   By: Kerby Moors M.D.   On: 08/02/2017 15:58     Assessment and plan- Patient is a 71 y.o. female with a history of stage I grade 2 endometrioid endometrial cancer in 2016 status post surgery and lung nodules  1. Endometrial cancer- pelvic exam not performed today and she will need to see DR. Secord for the same. Clinically doing well. No evidence of recurrence based on todays exam. rtc in 6 months with cbc, cmp and CA 125  2. Weight loss- improved. She is now gaining weight. No evidence of malignancy on recent scans  3. Lung nodules- discussed Ct thorax findings. Images reviewed independently. Lung nodules are getting smaller. However she continues to smoke and not interested in quitting. Will obtain repeat ct thorax in 6 months    Visit Diagnosis 1. Endometrial cancer ([place])   2. Abnormal findings on diagnostic imaging of lung   3. Lung nodule      Dr. Randa Evens, MD, MPH [facility] at [facility] Pager- [phone] 08/15/2017 4:40 PM

## 2017-08-15 NOTE — Progress Notes (Signed)
Here for follow up

## 2017-08-16 LAB — CA 125: CANCER ANTIGEN (CA) 125: 45.7 U/mL — AB (ref 0.0–38.1)

## 2017-08-23 ENCOUNTER — Ambulatory Visit: Payer: Medicare Other

## 2017-08-30 ENCOUNTER — Ambulatory Visit: Payer: Medicare Other

## 2018-02-05 ENCOUNTER — Telehealth: Payer: Self-pay | Admitting: Oncology

## 2018-02-05 NOTE — Telephone Encounter (Signed)
Returned Patient Call msg sent by Beverlee Nims. Spoke with patient and she requested to CANCEL ALL UPCOMING Lab/MD and CT appointments. DO NOT RESCHEDULE, per patient request. [facility] Scheduling notified of Cancellation.

## 2018-02-09 ENCOUNTER — Other Ambulatory Visit: Payer: Medicare Other

## 2018-02-09 ENCOUNTER — Ambulatory Visit: Payer: Medicare Other

## 2018-02-13 ENCOUNTER — Ambulatory Visit: Payer: Medicare Other | Admitting: Oncology

## 2023-05-02 ENCOUNTER — Emergency Department: Payer: Medicare PPO

## 2023-05-02 ENCOUNTER — Other Ambulatory Visit: Payer: Self-pay

## 2023-05-02 ENCOUNTER — Emergency Department
Admission: EM | Admit: 2023-05-02 | Discharge: 2023-05-02 | Disposition: A | Discharge status: Home / Self Care | Payer: Medicare PPO | Attending: Emergency Medicine | Admitting: Emergency Medicine

## 2023-05-02 ENCOUNTER — Encounter: Payer: Self-pay | Admitting: *Deleted

## 2023-05-02 DIAGNOSIS — W44F3XA Food entering into or through a natural orifice, initial encounter: Secondary | ICD-10-CM | POA: Diagnosis not present

## 2023-05-02 DIAGNOSIS — S80812D Abrasion, left lower leg, subsequent encounter: Secondary | ICD-10-CM | POA: Diagnosis not present

## 2023-05-02 DIAGNOSIS — T18128A Food in esophagus causing other injury, initial encounter: Secondary | ICD-10-CM | POA: Insufficient documentation

## 2023-05-02 DIAGNOSIS — L259 Unspecified contact dermatitis, unspecified cause: Secondary | ICD-10-CM | POA: Insufficient documentation

## 2023-05-02 DIAGNOSIS — R918 Other nonspecific abnormal finding of lung field: Secondary | ICD-10-CM | POA: Diagnosis not present

## 2023-05-02 DIAGNOSIS — T148XXA Other injury of unspecified body region, initial encounter: Secondary | ICD-10-CM

## 2023-05-02 MED ORDER — AZITHROMYCIN 500 MG PO TABS: 500.0000 mg | ORAL_TABLET | Freq: Every day | ORAL | 0 refills | Status: AC

## 2023-05-02 MED ORDER — AMOXICILLIN-POT CLAVULANATE 875-125 MG PO TABS: 1.0000 | ORAL_TABLET | Freq: Two times a day (BID) | ORAL | 0 refills | Status: AC

## 2023-05-02 NOTE — ED Triage Notes (Signed)
Pt reports steak is hung in her throat since last night.  Pt unable to hold down fluids.   No resp distress.  Pt alert.

## 2023-05-02 NOTE — ED Notes (Signed)
Left leg bandaged

## 2023-05-02 NOTE — ED Provider Notes (Signed)
[facility] Provider Note    Event Date/Time   First MD Initiated Contact with Patient 05/02/23 1634     (approximate)   History   Foreign Body in Ear and Swallowed Foreign Body   HPI  Michelle Hays is a 77 y.o. female  presenting to the emergency department for evaluation of difficulty swallowing.  Patient has a history of esophageal stricture with esophageal dilation last year.  Last night she was eating steak when she began to develop difficulty swallowing.  Has been able to swallow her secretions, but had not been able to tolerate food or liquids since 6 PM yesterday.  However, the time of my initial evaluation patient does report that she actually thinks that her swallowing has improved.  She does additionally report that over the past months she has had some shortness of breath with exertion.  Not significantly changed over the past few days.  Reports this feels similar to when she has had bronchitis in the past.  Finally, she reports that a few days ago she tripped and hit her leg.  Had a few wounds over her left lower leg that they have been putting over-the-counter antibiotic ointment on.       Physical Exam   Triage Vital Signs: ED Triage Vitals  Enc Vitals Group     BP 05/02/23 1516 134/63     Pulse Rate 05/02/23 1519 (!) 104     Resp 05/02/23 1516 18     Temp 05/02/23 1516 98.9 F (37.2 C)     Temp Source 05/02/23 1516 Oral     SpO2 05/02/23 1519 96 %     Weight 05/02/23 1517 167 lb (75.8 kg)     Height 05/02/23 1517 5\' 11"  (1.803 m)     Head Circumference --      Peak Flow --      Pain Score 05/02/23 1517 0     Pain Loc --      Pain Edu? --      Excl. in GC? --     Most recent vital signs: Vitals:   05/02/23 1519 05/02/23 1845  BP:  132/60  Pulse: (!) 104 98  Resp:  18  Temp:    SpO2: 96% 96%     General: Awake, interactive  CV:  Regular rate, good peripheral perfusion.  Resp:  Lungs clear, unlabored respirations.   Abd:  Soft, nondistended.  Neuro:  Symmetric facial movement, fluid speech MSK:  There are healing abrasions over the left lower extremity with overlying granulation tissue.  There is some well-demarcated erythema consistent with contact dermatitis from overlying bandage, otherwise no significant surrounding erythema, warmth   ED Results / Procedures / Treatments   Labs (all labs ordered are listed, but only abnormal results are displayed) Labs Reviewed - No data to display   EKG EKG independently reviewed interpreted by myself (ER attending) demonstrates:    RADIOLOGY Imaging independently reviewed and interpreted by myself demonstrates:    PROCEDURES:  Critical Care performed: No  Procedures   MEDICATIONS ORDERED IN ED: Medications - No data to display   IMPRESSION / MDM / ASSESSMENT AND PLAN / ED COURSE  I reviewed the triage vital signs and the nursing notes.  Differential diagnosis includes, but is not limited to, esophageal food bolus, pneumonia, viral illness, bronchitis, healing wounds, low suspicion for DVT given development of symptoms directly following trauma  Patient's presentation is most consistent with acute presentation with potential threat  to life or bodily function.  77 year old female presenting with multiple complaints.  Regarding difficulty swallowing, this fortunately appears to have improved at the time of my initial evaluation.  Patient was able to drink several sips of water without regurgitation and denies any pain with swallowing.  Her lungs are overall clear to auscultation and her O2 sat is reassuring, but will obtain x-Dori Devino to further evaluate for pneumonia.  Her lower extremity wounds appear to be well-healing.  Will apply new dressings to this area.  Patient continues to be able to swallow without difficulty on reevaluation.  Her x-Sonal Dorwart did result concerning for a possible mass with recommendation for CT.  This was ordered, but on  reevaluation, patient reports that she needs to be discharged and does not want to stay for a CT.  I did discuss my concerns about her x-Narada Uzzle findings including possible mass which could be reflective of lung cancer.  Patient understands this but says she needs to get home.  She does demonstrate medical decision-making pasty.  Discussed the importance of follow-up with her primary care doctor to possibly arrange this as an outpatient.  Strict return precautions were provided.  She was discharged in stable condition.    FINAL CLINICAL IMPRESSION(S) / ED DIAGNOSES   Final diagnoses:  Esophageal obstruction due to food impaction  Lung mass  Abrasion of skin     Rx / DC Orders   ED Discharge Orders          Ordered    amoxicillin-clavulanate (AUGMENTIN) 875-125 MG tablet  2 times daily        05/02/23 1835    azithromycin (ZITHROMAX) 500 MG tablet  Daily        05/02/23 1835             Note:  This document was prepared using Dragon voice recognition software and may include unintentional dictation errors.   Trinna Post, MD 05/02/23 2033

## 2023-05-02 NOTE — ED Provider Triage Note (Signed)
Emergency Medicine Provider Triage Evaluation Note  Michelle Hays , a 77 y.o. female  was evaluated in triage.  Pt complains of foreign body in throat. Ate steak last pm and feels that it is still there.  Hx of the same.  Unable to drink fluids today.   Review of Systems  Positive: + FB sensation Negative: No CP, SOB or difficulty swallowing saliva   Physical Exam  BP 134/63 (BP Location: Left Arm)   Pulse (!) 104   Temp 98.9 F (37.2 C) (Oral)   Resp 18   Ht 5\' 11"  (1.803 m)   Wt 75.8 kg   SpO2 96%   BMI 23.29 kg/m  Gen:   Awake, no distress  talking without difficulty.  Resp:  Normal effort  Lungs clear bilat.  MSK:   Moves extremities without difficulty  Other:    Medical Decision Making  Medically screening exam initiated at 3:22 PM.  Appropriate orders placed.  Michelle Hays was informed that the remainder of the evaluation will be completed by another provider, this initial triage assessment does not replace that evaluation, and the importance of remaining in the ED until their evaluation is complete.     Tommi Rumps, PA-C 05/02/23 1525

## 2023-05-02 NOTE — Discharge Instructions (Signed)
You were seen in the emergency department today for multiple complaints.  I am glad you are difficulty swallowing has resolved, I suspect you had a food bolus that passed.  Your chest x-Michelle Hays was abnormal concerning for possible lung mass.  We recommended you stay for a CT scan, but you did wish to be discharged without this.  As we discussed, this could reflect serious pathology such as cancer.  There is a possibility this could be related to a pneumonia, so I have sent antibiotics to your pharmacy.  Please arrange close outpatient follow-up with her primary care doctor for further evaluation.  Return to the ER for any new or worsening symptoms.

## 2023-08-16 ENCOUNTER — Other Ambulatory Visit: Payer: Self-pay | Admitting: Specialist

## 2023-08-16 DIAGNOSIS — R918 Other nonspecific abnormal finding of lung field: Secondary | ICD-10-CM

## 2023-08-22 ENCOUNTER — Ambulatory Visit: Admission: RE | Admit: 2023-08-22 | Payer: Medicare PPO | Source: Ambulatory Visit

## 2023-08-29 ENCOUNTER — Inpatient Hospital Stay: Admission: RE | Admit: 2023-08-29 | Payer: Medicare PPO | Source: Ambulatory Visit

## 2023-09-05 ENCOUNTER — Ambulatory Visit
Admission: RE | Admit: 2023-09-05 | Discharge: 2023-09-05 | Disposition: A | Discharge status: Home / Self Care | Payer: Medicare PPO | Source: Ambulatory Visit | Attending: Specialist | Admitting: Specialist

## 2023-09-05 DIAGNOSIS — R918 Other nonspecific abnormal finding of lung field: Secondary | ICD-10-CM | POA: Diagnosis present

## 2023-09-05 DIAGNOSIS — Z853 Personal history of malignant neoplasm of breast: Secondary | ICD-10-CM | POA: Insufficient documentation

## 2023-09-05 LAB — GLUCOSE, CAPILLARY: Glucose-Capillary: 78 mg/dL (ref 70–99)

## 2023-09-05 MED ORDER — FLUDEOXYGLUCOSE F - 18 (FDG) INJECTION
8.7000 | Freq: Once | INTRAVENOUS | Status: AC | PRN
  Administered 2023-09-05: 9.55 via INTRAVENOUS

## 2023-09-26 ENCOUNTER — Other Ambulatory Visit: Payer: Self-pay

## 2023-09-26 ENCOUNTER — Encounter: Payer: Self-pay | Admitting: Urgent Care

## 2023-09-26 ENCOUNTER — Encounter
Admission: RE | Admit: 2023-09-26 | Discharge: 2023-09-26 | Disposition: A | Discharge status: Home / Self Care | Payer: Medicare PPO | Source: Ambulatory Visit | Attending: Pulmonary Disease | Admitting: Pulmonary Disease

## 2023-09-26 ENCOUNTER — Other Ambulatory Visit: Payer: Self-pay | Admitting: Pulmonary Disease

## 2023-09-26 VITALS — BP 125/46 | HR 75 | Resp 20 | Ht 71.0 in | Wt 163.0 lb

## 2023-09-26 DIAGNOSIS — E785 Hyperlipidemia, unspecified: Secondary | ICD-10-CM | POA: Insufficient documentation

## 2023-09-26 DIAGNOSIS — E119 Type 2 diabetes mellitus without complications: Secondary | ICD-10-CM | POA: Diagnosis not present

## 2023-09-26 DIAGNOSIS — Z01812 Encounter for preprocedural laboratory examination: Secondary | ICD-10-CM | POA: Diagnosis present

## 2023-09-26 DIAGNOSIS — Z8673 Personal history of transient ischemic attack (TIA), and cerebral infarction without residual deficits: Secondary | ICD-10-CM | POA: Insufficient documentation

## 2023-09-26 DIAGNOSIS — R918 Other nonspecific abnormal finding of lung field: Secondary | ICD-10-CM

## 2023-09-26 DIAGNOSIS — I10 Essential (primary) hypertension: Secondary | ICD-10-CM | POA: Insufficient documentation

## 2023-09-26 DIAGNOSIS — J449 Chronic obstructive pulmonary disease, unspecified: Secondary | ICD-10-CM

## 2023-09-26 HISTORY — DX: Chronic obstructive pulmonary disease, unspecified: J44.9

## 2023-09-26 LAB — BASIC METABOLIC PANEL WITH GFR
Anion gap: 7 (ref 5–15)
BUN: 14 mg/dL (ref 8–23)
CO2: 25 mmol/L (ref 22–32)
Calcium: 8.5 mg/dL — ABNORMAL LOW (ref 8.9–10.3)
Chloride: 103 mmol/L (ref 98–111)
Creatinine, Ser: 0.87 mg/dL (ref 0.44–1.00)
GFR, Estimated: >60 mL/min
Glucose, Bld: 102 mg/dL — ABNORMAL HIGH (ref 70–99)
Potassium: 4.4 mmol/L (ref 3.5–5.1)
Sodium: 135 mmol/L (ref 135–145)

## 2023-09-26 LAB — CBC
HCT: 36.9 % (ref 36.0–46.0)
Hemoglobin: 12.2 g/dL (ref 12.0–15.0)
MCH: 28 pg (ref 26.0–34.0)
MCHC: 33.1 g/dL (ref 30.0–36.0)
MCV: 84.6 fL (ref 80.0–100.0)
Platelets: 290 10*3/uL (ref 150–400)
RBC: 4.36 MIL/uL (ref 3.87–5.11)
RDW: 13.7 % (ref 11.5–15.5)
WBC: 10.1 10*3/uL (ref 4.0–10.5)
nRBC: 0 % (ref 0.0–0.2)

## 2023-09-26 NOTE — Patient Instructions (Addendum)
Your procedure is scheduled on: Wednesday 10/04/23 To find out your arrival time, please call [phone] between 1PM - 3PM on:   Tuesday 10/03/23 Report to the Registration Desk on the 1st floor of the [facility]. Free Valet parking is available.  If your arrival time is 6:00 am, do not arrive before that time as the [facility] entrance doors do not open until 6:00 am.  REMEMBER: Instructions that are not followed completely may result in serious medical risk, up to and including death; or upon the discretion of your surgeon and anesthesiologist your surgery may need to be rescheduled.  Do not eat food after midnight the night before surgery.  No gum chewing or hard candies.  You may however, drink CLEAR liquids up to 2 hours before you are scheduled to arrive for your surgery. Do not drink anything within 2 hours of your scheduled arrival time.  Clear liquids include: - water   Type 1 and Type 2 diabetics should only drink water.  One week prior to surgery: Stop Anti-inflammatories (NSAIDS) such as Advil, Aleve, Ibuprofen, Motrin, Naproxen, Naprosyn and Aspirin based products such as Excedrin, Goody's Powder, BC Powder. You may however, continue to take Tylenol if needed for pain up until the day of surgery.  Stop ANY OVER THE COUNTER supplements and vitamins until after surgery.  Continue taking all prescribed medications with the exception of: Januvia, last dose will be Saturday 09/30/23. Aspirin last dose will be Thursday 09/28/23  TAKE ONLY THESE MEDICATIONS THE MORNING OF SURGERY WITH A SIP OF WATER:  ARIPiprazole (ABILIFY) 5 MG tablet  atorvastatin (LIPITOR) 40 MG tablet  omeprazole (PRILOSEC) 20 MG capsule Antacid (take one the night before and one on the morning of surgery - helps to prevent nausea after surgery.)  Use inhalers on the day of surgery and bring to the hospital. Use your Trelegy and bring your Albuterol  No Alcohol for 24 hours before or after  surgery.  No Smoking including e-cigarettes for 24 hours before surgery.  No chewable tobacco products for at least 6 hours before surgery.  No nicotine patches on the day of surgery.  Do not use any "recreational" drugs for at least a week (preferably 2 weeks) before your surgery.  Please be advised that the combination of cocaine and anesthesia may have negative outcomes, up to and including death. If you test positive for cocaine, your surgery will be cancelled.  On the morning of surgery brush your teeth with toothpaste and water, you may rinse your mouth with mouthwash if you wish. Do not swallow any toothpaste or mouthwash.  Use CHG Soap or wipes as directed on instruction sheet. Shower the morning of your procedure with your regular soap  Do not wear lotions, powders, or perfumes.   Do not shave body hair from the neck down 48 hours before surgery.  Wear comfortable clothing (specific to your surgery type) to the hospital.  Do not wear jewelry, make-up, hairpins, clips or nail polish.  For welded (permanent) jewelry: bracelets, anklets, waist bands, etc.  Please have this removed prior to surgery.  If it is not removed, there is a chance that hospital personnel will need to cut it off on the day of surgery. Contact lenses, hearing aids and dentures may not be worn into surgery.  Do not bring valuables to the hospital. [facility] is not responsible for any missing/lost belongings or valuables.   Notify your doctor if there is any change in your medical  condition (cold, fever, infection).  If you are being discharged the day of surgery, you will not be allowed to drive home. You will need a responsible individual to drive you home and stay with you for 24 hours after surgery.   If you are taking public transportation, you will need to have a responsible individual with you.  If you are being admitted to the hospital overnight, leave your suitcase in the car. After surgery it  may be brought to your room.  In case of increased patient census, it may be necessary for you, the patient, to continue your postoperative care in the Same Day Surgery department.  After surgery, you can help prevent lung complications by doing breathing exercises.  Take deep breaths and cough every 1-2 hours. Your doctor may order a device called an Incentive Spirometer to help you take deep breaths. When coughing or sneezing, hold a pillow firmly against your incision with both hands. This is called "splinting." Doing this helps protect your incision. It also decreases belly discomfort.  Surgery Visitation Policy:  Patients undergoing a surgery or procedure may have two family members or support persons with them as long as the person is not COVID-19 positive or experiencing its symptoms.   Inpatient Visitation:    Visiting hours are 7 a.m. to 8 p.m. Up to four visitors are allowed at one time in a patient room. The visitors may rotate out with other people during the day. One designated support person (adult) may remain overnight.  Please call the Pre-admissions Testing Dept. at [phone] if you have any questions about these instructions.

## 2023-10-03 ENCOUNTER — Ambulatory Visit: Admission: RE | Admit: 2023-10-03 | Payer: Medicare PPO | Source: Ambulatory Visit

## 2023-10-04 ENCOUNTER — Encounter: Admission: RE | Payer: Self-pay | Source: Home / Self Care

## 2023-10-04 ENCOUNTER — Ambulatory Visit: Admission: RE | Admit: 2023-10-04 | Payer: Medicare PPO | Source: Home / Self Care

## 2023-10-04 SURGERY — BRONCHOSCOPY, WITH EBUS
Anesthesia: General

## 2023-11-21 ENCOUNTER — Institutional Professional Consult (permissible substitution): Payer: Medicare PPO | Admitting: Radiation Oncology

## 2023-11-28 ENCOUNTER — Ambulatory Visit
Admission: RE | Admit: 2023-11-28 | Discharge: 2023-11-28 | Disposition: A | Discharge status: Home / Self Care | Payer: Medicare PPO | Source: Ambulatory Visit | Attending: Radiation Oncology | Admitting: Radiation Oncology

## 2023-11-28 ENCOUNTER — Encounter: Payer: Self-pay | Admitting: Radiation Oncology

## 2023-11-28 DIAGNOSIS — R918 Other nonspecific abnormal finding of lung field: Secondary | ICD-10-CM

## 2023-11-28 DIAGNOSIS — C3431 Malignant neoplasm of lower lobe, right bronchus or lung: Secondary | ICD-10-CM | POA: Insufficient documentation

## 2023-11-28 DIAGNOSIS — Z510 Encounter for antineoplastic radiation therapy: Secondary | ICD-10-CM | POA: Insufficient documentation

## 2023-11-28 NOTE — Consult Note (Signed)
 NEW PATIENT EVALUATION  Name: Michelle Hays  MRN: [id number]  Date:   11/28/2023     DOB: December 17, 1945   This 78 y.o. 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  REFERRING PHYSICIAN: Theotis Lavelle BRAVO, MD  CHIEF COMPLAINT:  Chief Complaint  Patient presents with   Lung Cancer    DIAGNOSIS: There were no encounter diagnoses.   PREVIOUS INVESTIGATIONS:  PET CT and CT scans reviewed Labs reviewed Clinical notes reviewed  HPI: Patient is a 78 year old female followed by Dr. Theotis for progressive right lung mass.  She has multiple medical comorbidities including hypertension diabetes history of breast cancer COPD.  She is was noted to have a right lower lobe lobulated opacity which may represent a pulmonary mass.  PET CT scan demonstrated intensely hypermetabolic bilobed mass in the right lower lobe consistent with primary bronchogenic carcinoma there is also hypermetabolic right hilar and mediastinal nodes consistent with nodal disease and stage IIIb non-small cell lung cancer.  No other evidence of metastasis in the abdomen pelvis was noted and no evidence of skeletal metastasis.  Patient claims she is fairly asymptomatic does have an occasional cough no hemoptysis or chest tightness that she appears to have lost weight.  Patient is adamant about having a bronchoscopy and biopsy which she is canceled and also specifically declines chemotherapy intervention.  She is referred to radiation oncology for opinion.  PLANNED TREATMENT REGIMEN: IMRT radiation therapy  PAST MEDICAL HISTORY:  has a past medical history of Anxiety, Arthritis, Asthma, Atypical squamous cell changes of undetermined significance (ASCUS) on cervical cytology with positive high risk human papilloma virus (HPV) (03/16/2015), BP (high blood pressure) (06/27/2014), Breast cancer (HCC), Breast cancer, left breast (HCC) (1995), CAFL (chronic airflow  limitation) (HCC), COPD (chronic obstructive pulmonary disease) (HCC), DDD (degenerative disc disease), lumbar, Depression, Diabetes mellitus without complication (HCC), Endometrial cancer determined by uterine biopsy (HCC) (2016), GERD (gastroesophageal reflux disease), Heart murmur, Hyperlipidemia, Hypertension, IBS (irritable bowel syndrome), Migraine, Osteopenia, and Stroke (HCC).    PAST SURGICAL HISTORY:  Past Surgical History:  Procedure Laterality Date   ABDOMINAL HYSTERECTOMY     BREAST LUMPECTOMY  2012   [facility]-bilateral breast lumpectomy   CATARACT EXTRACTION  2013   CHOLECYSTECTOMY     CHOLECYSTECTOMY, LAPAROSCOPIC     COLONOSCOPY WITH PROPOFOL  N/A 07/06/2016   Procedure: COLONOSCOPY WITH PROPOFOL ;  Surgeon: Gladis RAYMOND Mariner, MD;  Location: [facility] ENDOSCOPY;  Service: Endoscopy;  Laterality: N/A;   ESOPHAGOGASTRODUODENOSCOPY  03/02/2008, 10/12/2010, 03/28/2013   ESOPHAGOGASTRODUODENOSCOPY (EGD) WITH PROPOFOL  N/A 07/06/2016   Procedure: ESOPHAGOGASTRODUODENOSCOPY (EGD) WITH PROPOFOL ;  Surgeon: Gladis RAYMOND Mariner, MD;  Location: [facility] ENDOSCOPY;  Service: Endoscopy;  Laterality: N/A;   EYE SURGERY     LAPAROSCOPIC BILATERAL SALPINGO OOPHERECTOMY Bilateral 05/20/2015   Procedure: LAPAROSCOPIC BILATERAL SALPINGO OOPHORECTOMY;  Surgeon: Webb Isidor Constable, MD;  Location: [facility] ORS;  Service: Gynecology;  Laterality: Bilateral;   ROBOTIC ASSISTED TOTAL HYSTERECTOMY N/A 05/20/2015   Procedure: ROBOTIC ASSISTED TOTAL HYSTERECTOMY;  Surgeon: Webb Isidor Constable, MD;  Location: [facility] ORS;  Service: Gynecology;  Laterality: N/A;    FAMILY HISTORY: family history includes Brain cancer in her mother; Breast cancer in her maternal aunt; Coronary artery disease in her father; Diabetes Mellitus II in her mother; Hyperlipidemia in her brother; Hypertension in her brother, mother, and sister; Lung cancer in her mother.  SOCIAL HISTORY:  reports that she has quit smoking. Her smoking use included  cigarettes. She has a 104 pack-year smoking history. She has never used smokeless tobacco. She reports that she does not drink alcohol and does not use drugs.  ALLERGIES: Sulfa antibiotics  MEDICATIONS:  Current Outpatient Medications  Medication Sig Dispense Refill   Apoaequorin (PREVAGEN) 10 MG CAPS Take 1 capsule by mouth daily.     ARIPiprazole  (ABILIFY ) 5 MG tablet Take 5 mg by mouth daily.     Fluticasone-Umeclidin-Vilant (TRELEGY ELLIPTA) 200-62.5-25 MCG/ACT AEPB Inhale 1 Inhalation into the lungs daily.     sitaGLIPtin (JANUVIA) 100 MG tablet Take 100 mg by mouth daily.     albuterol  (PROVENTIL  HFA;VENTOLIN  HFA) 108 (90 BASE) MCG/ACT inhaler Inhale 2 puffs into the lungs every 4 (four) hours as needed for wheezing. For wheezing (Patient not taking: Reported on 11/28/2023)     aspirin  EC 81 MG tablet Take 1 tablet by mouth daily.     atorvastatin  (LIPITOR) 40 MG tablet Take 40 mg by mouth daily.     calcium -vitamin D (OSCAL WITH D) 500-200 MG-UNIT tablet Take 1 tablet by mouth. (Patient not taking: Reported on 09/26/2023)     cholecalciferol (VITAMIN D) 400 units TABS tablet Take 1,000 Units by mouth daily.     HYDROcodone -acetaminophen  (NORCO) 10-325 MG tablet Take 1 tablet by mouth every 6 (six) hours as needed. (Patient not taking: Reported on 09/26/2023)     loperamide (IMODIUM) 2 MG capsule Take by mouth as needed for diarrhea or loose stools.     LORazepam  (ATIVAN ) 1 MG tablet Take 1 mg by mouth at bedtime.     magnesium oxide (MAG-OX) 400 MG tablet Take 400 mg by mouth daily.     OLANZapine  (ZYPREXA ) 2.5 MG tablet Take 2.5 mg by mouth daily.     omeprazole  (PRILOSEC) 20 MG capsule Take 40 mg by mouth daily.     Probiotic Product (PROBIOTIC DAILY PO) Take 1 capsule by mouth daily.     sucralfate (CARAFATE) 1 GM/10ML suspension Take 1 g by mouth 4 (four) times daily -  with meals and at bedtime.     tiotropium (SPIRIVA) 18 MCG inhalation capsule Place 18 mcg into inhaler and inhale  daily. (Patient not taking: Reported on 09/26/2023)     No current facility-administered medications for this encounter.    ECOG PERFORMANCE STATUS:  0 - Asymptomatic  REVIEW OF SYSTEMS: Patient denies any weight loss, fatigue, weakness, fever, chills or night sweats. Patient denies any loss of vision, blurred vision. Patient denies any ringing  of the ears or hearing loss. No irregular heartbeat. Patient denies heart murmur or history of fainting. Patient denies any chest pain or pain radiating to her upper extremities. Patient denies any shortness of breath, difficulty breathing at night, cough or hemoptysis. Patient denies any swelling in the lower legs. Patient denies any nausea vomiting, vomiting of blood, or coffee ground material in the vomitus. Patient denies any stomach pain. Patient states has had normal bowel movements no significant constipation or diarrhea. Patient denies any dysuria, hematuria or significant nocturia. Patient denies any problems walking, swelling in the joints or loss of balance. Patient denies any skin changes, loss of hair or loss of weight. Patient denies any excessive worrying or anxiety or significant depression. Patient denies any problems with insomnia. Patient denies excessive thirst, polyuria, polydipsia. Patient denies any swollen glands, patient denies easy bruising or easy bleeding. Patient denies any recent infections, allergies or URI. Patient s visual fields have not changed significantly in recent time.   PHYSICAL  EXAM: There were no vitals taken for this visit. Well-developed well-nourished patient in NAD. HEENT reveals PERLA, EOMI, discs not visualized.  Oral cavity is clear. No oral mucosal lesions are identified. Neck is clear without evidence of cervical or supraclavicular adenopathy. Lungs are clear to A&P. Cardiac examination is essentially unremarkable with regular rate and rhythm without murmur rub or thrill. Abdomen is benign with no organomegaly  or masses noted. Motor sensory and DTR levels are equal and symmetric in the upper and lower extremities. Cranial nerves II through XII are grossly intact. Proprioception is intact. No peripheral adenopathy or edema is identified. No motor or sensory levels are noted. Crude visual fields are within normal range.  LABORATORY DATA: Labs reviewed    RADIOLOGY RESULTS: PET CT scan reviewed compatible with above-stated findings   IMPRESSION: Stage IIIb non-small cell lung cancer of the right lung in 78 year old female refusing both bronchoscopy as well as chemotherapy intervention.  PLAN: I met today with multiple family members including the patient.  I have recommended radiation therapy.  Will plan on delivering 70 Gray over 7 weeks to areas of hypermetabolic activity in the chest.  I would choose IMRT treatment planning and delivery based 3B nature of her disease as well as spare critical structures such as normal lung volume heart and esophagus spinal cord.  I am also referring her to medical oncology for opinion and further discussion on chemotherapy options.  Patient comprehends my recommendations well.  We will go ahead and simulate her in the next 1 to 2 weeks she will have medical oncology opinion prior to simulation.  Risks and benefits of treatment including increased cough fatigue possible radiation esophagitis alteration of blood counts skin reaction all were reviewed with the patient.  I believe she comprehends my treatment plan well.  I would like to take this opportunity to thank you for allowing me to participate in the care of your patient.SABRA Marcey Penton, MD

## 2023-11-30 ENCOUNTER — Other Ambulatory Visit: Payer: Self-pay | Admitting: Radiation Oncology

## 2023-11-30 DIAGNOSIS — C801 Malignant (primary) neoplasm, unspecified: Secondary | ICD-10-CM

## 2023-12-04 ENCOUNTER — Ambulatory Visit: Payer: Medicare PPO | Admitting: Oncology

## 2023-12-12 ENCOUNTER — Ambulatory Visit
Admission: RE | Admit: 2023-12-12 | Discharge: 2023-12-12 | Disposition: A | Discharge status: Home / Self Care | Payer: Medicare PPO | Source: Ambulatory Visit | Attending: Radiation Oncology | Admitting: Radiation Oncology

## 2023-12-12 ENCOUNTER — Inpatient Hospital Stay: Payer: Medicare PPO

## 2023-12-12 ENCOUNTER — Inpatient Hospital Stay: Payer: Medicare PPO | Admitting: Oncology

## 2023-12-12 ENCOUNTER — Encounter: Payer: Self-pay | Admitting: Oncology

## 2023-12-12 ENCOUNTER — Encounter: Payer: Self-pay | Admitting: *Deleted

## 2023-12-12 VITALS — BP 103/50 | HR 88 | Temp 96.7°F | Resp 18 | Ht 71.0 in | Wt 162.0 lb

## 2023-12-12 DIAGNOSIS — Z803 Family history of malignant neoplasm of breast: Secondary | ICD-10-CM

## 2023-12-12 DIAGNOSIS — R918 Other nonspecific abnormal finding of lung field: Secondary | ICD-10-CM | POA: Insufficient documentation

## 2023-12-12 DIAGNOSIS — R590 Localized enlarged lymph nodes: Secondary | ICD-10-CM | POA: Insufficient documentation

## 2023-12-12 DIAGNOSIS — Z510 Encounter for antineoplastic radiation therapy: Secondary | ICD-10-CM | POA: Diagnosis present

## 2023-12-12 DIAGNOSIS — Z808 Family history of malignant neoplasm of other organs or systems: Secondary | ICD-10-CM | POA: Insufficient documentation

## 2023-12-12 DIAGNOSIS — Z801 Family history of malignant neoplasm of trachea, bronchus and lung: Secondary | ICD-10-CM | POA: Insufficient documentation

## 2023-12-12 DIAGNOSIS — Z8542 Personal history of malignant neoplasm of other parts of uterus: Secondary | ICD-10-CM | POA: Insufficient documentation

## 2023-12-12 DIAGNOSIS — C3431 Malignant neoplasm of lower lobe, right bronchus or lung: Secondary | ICD-10-CM | POA: Diagnosis not present

## 2023-12-12 DIAGNOSIS — Z87891 Personal history of nicotine dependence: Secondary | ICD-10-CM

## 2023-12-12 DIAGNOSIS — C801 Malignant (primary) neoplasm, unspecified: Secondary | ICD-10-CM

## 2023-12-12 NOTE — Progress Notes (Signed)
Met with patient during initial consult with Dr. Smith Robert. All questions answered during visit. Informed pt that will coordinate appts for imaging with PET scan and brain MRI at [facility] in [place] per pt request. Authorization pending at this time. Once auth received, will fax with orders to [phone]. Contact info given to pt and instructed to call with any questions or needs. Pt verbalized understanding.

## 2023-12-14 NOTE — Progress Notes (Signed)
Hematology/Oncology Consult note [facility] [phone] [phone]  Patient Care Team: Oneal Grout, FNP as PCP - General (Family Medicine) Keitha Butte, RN as Registered Nurse Artelia Laroche, MD as Consulting Physician (Obstetrics and Gynecology) Schermerhorn, Ihor Austin, MD as Referring Physician (Obstetrics and Gynecology) Carmina Miller, MD as Consulting Physician (Radiation Oncology) Glory Buff, RN as Oncology Nurse Navigator   Name of the patient: Michelle Hays  [id number]  [birth year]    Reason for referral- lung mass and mediastinal adenopathy   Referring physician-Dr. Rushie Chestnut  Date of visit: 12/14/23   History of presenting illness-patient is a 78 year old female who last saw me back in 2018 for history of stage I endometrioid endometrial cancer that was diagnosed with a back in 2016 as well as lung nodules and was subsequentlyTo follow-up.  She was incidentally found to have hilar mass on CT chest which was followed by a PET CT scan in October 2024.  PET scan showed intensely hypermetabolic bilobed mass in the right lower lobe.  Peripheral mass measuring 3.8 cm and a more central mass measuring 4.5 cm.  Hypermetabolic mediastinal and hilar adenopathy.  No evidence of distant metastatic disease.  Patient has chosen not to proceed with bronchoscopy for definitive diagnosis.  She is not interested in receiving any chemotherapy.  Patient was subsequently seen by Dr. Rushie Chestnut in January 2025 for consideration of empiric radiation and a 7-week course of radiation was recommended by him.  She has been referred for medical oncology opinion  Patient currently feels well and denies any complaints at this time.  Appetite and weight have remained stable.  Denies any cough or shortness of breath  ECOG PS- 1  Pain scale- 0   Review of systems- Review of Systems  Constitutional:  Negative for chills, fever, malaise/fatigue and  weight loss.  HENT:  Negative for congestion, ear discharge and nosebleeds.   Eyes:  Negative for blurred vision.  Respiratory:  Negative for cough, hemoptysis, sputum production, shortness of breath and wheezing.   Cardiovascular:  Negative for chest pain, palpitations, orthopnea and claudication.  Gastrointestinal:  Negative for abdominal pain, blood in stool, constipation, diarrhea, heartburn, melena, nausea and vomiting.  Genitourinary:  Negative for dysuria, flank pain, frequency, hematuria and urgency.  Musculoskeletal:  Negative for back pain, joint pain and myalgias.  Skin:  Negative for rash.  Neurological:  Negative for dizziness, tingling, focal weakness, seizures, weakness and headaches.  Endo/Heme/Allergies:  Does not bruise/bleed easily.  Psychiatric/Behavioral:  Negative for depression and suicidal ideas. The patient does not have insomnia.     Allergies  Allergen Reactions   Sulfa Antibiotics Rash    Patient Active Problem List   Diagnosis Date Noted   Post-operative state 05/20/2015   Endometrial cancer (HCC) 03/16/2015   Bursitis, trochanteric 09/24/2014   Anxiety 06/27/2014   Breast CA (HCC) 06/27/2014   CAFL (chronic airflow limitation) (HCC) 06/27/2014   Clinical depression 06/27/2014   Diabetes (HCC) 06/27/2014   Cerebrovascular accident, old 06/27/2014   BP (high blood pressure) 06/27/2014   HLD (hyperlipidemia) 06/27/2014   Adaptive colitis 06/27/2014   Compulsive tobacco user syndrome 06/27/2014   DDD (degenerative disc disease), lumbar 05/19/2014   Impingement syndrome of shoulder 05/19/2014   Neuritis or radiculitis due to rupture of lumbar intervertebral disc 05/19/2014     Past Medical History:  Diagnosis Date   Anxiety    Arthritis    Asthma    Atypical squamous cell changes of undetermined significance (  ASCUS) on cervical cytology with positive high risk human papilloma virus (HPV) 03/16/2015   BP (high blood pressure) 06/27/2014   Breast  cancer (HCC)    Breast cancer, left breast (HCC) 1995   Left Breast    CAFL (chronic airflow limitation) (HCC)    COPD (chronic obstructive pulmonary disease) (HCC)    DDD (degenerative disc disease), lumbar    Depression    Diabetes mellitus without complication (HCC)    Patient takes Januvia   Endometrial cancer determined by uterine biopsy (HCC) 2016   GERD (gastroesophageal reflux disease)    Heart murmur    Hyperlipidemia    Hypertension    IBS (irritable bowel syndrome)    Migraine    Osteopenia    Stroke [facility])      Past Surgical History:  Procedure Laterality Date   ABDOMINAL HYSTERECTOMY     BREAST LUMPECTOMY  2012   By[facility]nett-bilateral breast lumpectomy   CATARACT EXTRACTION  2013   CHOLECYSTECTOMY     CHOLECYSTECTOMY, LAPAROSCOPIC     COLONOSCOPY WITH PROPOFOL N/A 07/06/2016   Procedure: COLONOSCOPY WITH PROPOFOL;  Surgeon: Christena Deem, MD;  Location: [facility] ENDOSCOPY;  Service: Endoscopy;  Laterality: N/A;   ESOPHAGOGASTRODUODENOSCOPY  03/02/2008, 10/12/2010, 03/28/2013   ESOPHAGOGASTRODUODENOSCOPY (EGD) WITH PROPOFOL N/A 07/06/2016   Procedure: ESOPHAGOGASTRODUODENOSCOPY (EGD) WITH PROPOFOL;  Surgeon: Christena Deem, MD;  Location: [facility] ENDOSCOPY;  Service: Endoscopy;  Laterality: N/A;   EYE SURGERY     LAPAROSCOPIC BILATERAL SALPINGO OOPHERECTOMY Bilateral 05/20/2015   Procedure: LAPAROSCOPIC BILATERAL SALPINGO OOPHORECTOMY;  Surgeon: Artelia Laroche, MD;  Location: [facility] ORS;  Service: Gynecology;  Laterality: Bilateral;   ROBOTIC ASSISTED TOTAL HYSTERECTOMY N/A 05/20/2015   Procedure: ROBOTIC ASSISTED TOTAL HYSTERECTOMY;  Surgeon: Artelia Laroche, MD;  Location: [facility] ORS;  Service: Gynecology;  Laterality: N/A;    Social History   Socioeconomic History   Marital status: Widowed    Spouse name: Not on file   Number of children: Not on file   Years of education: Not on file   Highest education level: Not on file  Occupational History   Not on  file  Tobacco Use   Smoking status: Former    Current packs/day: 2.00    Average packs/day: 2.0 packs/day for 52.0 years (104.0 ttl pk-yrs)    Types: Cigarettes   Smokeless tobacco: Never  Vaping Use   Vaping status: Never Used  Substance and Sexual Activity   Alcohol use: No    Alcohol/week: 0.0 standard drinks of alcohol   Drug use: No   Sexual activity: Not Currently  Other Topics Concern   Not on file  Social History Narrative   Not on file   Social Drivers of Health   Financial Resource Strain: Low Risk  (09/19/2023)   [facility] from [facility]   Overall Financial Resource Strain (CARDIA)    Difficulty of Paying Living Expenses: Not hard at all  Food Insecurity: No Food Insecurity (12/12/2023)   Hunger Vital Sign    Worried About Running Out of Food in the Last Year: Never true    Ran Out of Food in the Last Year: Never true  Transportation Needs: No Transportation Needs (12/12/2023)   PRAPARE - Administrator, Civil Service (Medical): No    Lack of Transportation (Non-Medical): No  Physical Activity: Not on file  Stress: Not on file  Social Connections: Not on file  Intimate Partner Violence: Not At Risk (12/12/2023)   Humiliation, Afraid,  Rape, and Kick questionnaire    Fear of Current or Ex-Partner: No    Emotionally Abused: No    Physically Abused: No    Sexually Abused: No     Family History  Problem Relation Age of Onset   Lung cancer Mother        BONE, BRAIN METS   Brain cancer Mother    Diabetes Mellitus II Mother    Hypertension Mother        X 2 BROTHERS   Coronary artery disease Father    Hypertension Sister    Hyperlipidemia Brother        X 2 BROTHERS   Hypertension Brother    Breast cancer Maternal Aunt      Current Outpatient Medications:    Apoaequorin (PREVAGEN) 10 MG CAPS, Take 1 capsule by mouth daily., Disp: , Rfl:    ARIPiprazole (ABILIFY) 5 MG tablet, Take 5 mg by mouth daily., Disp: , Rfl:     aspirin EC 81 MG tablet, Take 1 tablet by mouth daily., Disp: , Rfl:    atorvastatin (LIPITOR) 40 MG tablet, Take 40 mg by mouth daily., Disp: , Rfl:    cholecalciferol (VITAMIN D) 400 units TABS tablet, Take 1,000 Units by mouth daily., Disp: , Rfl:    Fluticasone-Umeclidin-Vilant (TRELEGY ELLIPTA) 200-62.5-25 MCG/ACT AEPB, Inhale 1 Inhalation into the lungs daily., Disp: , Rfl:    loperamide (IMODIUM) 2 MG capsule, Take by mouth as needed for diarrhea or loose stools., Disp: , Rfl:    magnesium oxide (MAG-OX) 400 MG tablet, Take 400 mg by mouth daily., Disp: , Rfl:    OLANZapine (ZYPREXA) 2.5 MG tablet, Take 2.5 mg by mouth daily., Disp: , Rfl:    omeprazole (PRILOSEC) 20 MG capsule, Take 40 mg by mouth daily., Disp: , Rfl:    Probiotic Product (PROBIOTIC DAILY PO), Take 1 capsule by mouth daily., Disp: , Rfl:    sitaGLIPtin (JANUVIA) 100 MG tablet, Take 100 mg by mouth daily., Disp: , Rfl:    albuterol (PROVENTIL HFA;VENTOLIN HFA) 108 (90 BASE) MCG/ACT inhaler, Inhale 2 puffs into the lungs every 4 (four) hours as needed for wheezing. For wheezing (Patient not taking: Reported on 11/28/2023), Disp: , Rfl:    calcium-vitamin D (OSCAL WITH D) 500-200 MG-UNIT tablet, Take 1 tablet by mouth. (Patient not taking: Reported on 09/26/2023), Disp: , Rfl:    HYDROcodone-acetaminophen (NORCO) 10-325 MG tablet, Take 1 tablet by mouth every 6 (six) hours as needed. (Patient not taking: Reported on 09/26/2023), Disp: , Rfl:    LORazepam (ATIVAN) 1 MG tablet, Take 1 mg by mouth at bedtime., Disp: , Rfl:    sucralfate (CARAFATE) 1 GM/10ML suspension, Take 1 g by mouth 4 (four) times daily -  with meals and at bedtime., Disp: , Rfl:    tiotropium (SPIRIVA) 18 MCG inhalation capsule, Place 18 mcg into inhaler and inhale daily. (Patient not taking: Reported on 09/26/2023), Disp: , Rfl:    Physical exam:  Vitals:   12/12/23 1425  BP: (!) 103/50  Pulse: 88  Resp: 18  Temp: (!) 96.7 F (35.9 C)  TempSrc: Tympanic   SpO2: 98%  Weight: 162 lb (73.5 kg)  Height: 5\' 11"  (1.803 m)   Physical Exam Cardiovascular:     Rate and Rhythm: Normal rate and regular rhythm.     Heart sounds: Normal heart sounds.  Pulmonary:     Effort: Pulmonary effort is normal.     Breath sounds: Normal breath sounds.  Skin:  General: Skin is warm and dry.  Neurological:     Mental Status: She is alert and oriented to person, place, and time.           Latest Ref Rng & Units 09/26/2023   10:04 AM  CMP  Glucose 70 - 99 mg/dL 161   BUN 8 - 23 mg/dL 14   Creatinine 0.96 - 1.00 mg/dL 0.45   Sodium 409 - 811 mmol/L 135   Potassium 3.5 - 5.1 mmol/L 4.4   Chloride 98 - 111 mmol/L 103   CO2 22 - 32 mmol/L 25   Calcium 8.9 - 10.3 mg/dL 8.5       Latest Ref Rng & Units 09/26/2023   10:04 AM  CBC  WBC 4.0 - 10.5 K/uL 10.1   Hemoglobin 12.0 - 15.0 g/dL 91.4   Hematocrit 78.2 - 46.0 % 36.9   Platelets 150 - 400 K/uL 290     Assessment and plan- Patient is a 78 y.o. female with prior history of stage I endometrial cancer in 2016 now found to have right lower lobe lung mass and mediastinal adenopathy concerning for stage III lung cancer  I have reviewed PET CT scan images independently and discussed findings with the patient and her son.  PET CT scan shows bilobed right lower lobe lung mass along with evidence of hilar and mediastinal adenopathy overall concerning for stage III lung cancer.  Patient ideally needs bronchoscopy for tissue diagnosis and to confirm if it is small cell versus non-small cell lung cancer.  Patient does not wish to go through bronchoscopy.  She is also chosen not to proceed with chemotherapy and in any case I cannot offer her any systemic treatment in the absence of tissue diagnosis.  She wishes to proceed with empiric radiation to her lung mass and mediastinal lymph nodes and a 7-week course of radiation treatment is planned for her.  From my standpoint I have nothing more to offer at this time  other than getting a repeat PET scan and an MRI brain since it has been 3 months since her last PET scan to confirm she does not have any evidence of distant metastatic disease.  I will tentatively see her back in 3 weeks time to discuss those results.  Patient will be getting the scans at [facility] imaging.  She comprehends my plan well   Thank you for this kind referral and the opportunity to participate in the care of this patient   Visit Diagnosis 1. Lung mass   2. Mediastinal adenopathy     Dr. Owens Shark, MD, MPH [facility] at [facility] [phone] 12/14/2023

## 2023-12-15 ENCOUNTER — Other Ambulatory Visit: Payer: Self-pay | Admitting: *Deleted

## 2023-12-15 DIAGNOSIS — R918 Other nonspecific abnormal finding of lung field: Secondary | ICD-10-CM

## 2023-12-18 DIAGNOSIS — Z510 Encounter for antineoplastic radiation therapy: Secondary | ICD-10-CM | POA: Diagnosis not present

## 2023-12-19 NOTE — Progress Notes (Signed)
Authorization received for PET and brain MRI. Orders and auth# faxed to [facility] in [place].

## 2023-12-20 ENCOUNTER — Ambulatory Visit
Admission: RE | Admit: 2023-12-20 | Discharge: 2023-12-20 | Disposition: A | Discharge status: Home / Self Care | Payer: Medicare PPO | Source: Ambulatory Visit | Attending: Radiation Oncology | Admitting: Radiation Oncology

## 2023-12-21 ENCOUNTER — Other Ambulatory Visit: Payer: Self-pay

## 2023-12-21 ENCOUNTER — Ambulatory Visit
Admission: RE | Admit: 2023-12-21 | Discharge: 2023-12-21 | Disposition: A | Discharge status: Home / Self Care | Payer: Medicare PPO | Source: Ambulatory Visit | Attending: Radiation Oncology | Admitting: Radiation Oncology

## 2023-12-21 ENCOUNTER — Telehealth: Payer: Self-pay

## 2023-12-21 DIAGNOSIS — Z510 Encounter for antineoplastic radiation therapy: Secondary | ICD-10-CM | POA: Diagnosis not present

## 2023-12-21 LAB — RAD ONC ARIA SESSION SUMMARY
Course Elapsed Days: 0
Plan Fractions Treated to Date: 1
Plan Prescribed Dose Per Fraction: 2 Gy
Plan Total Fractions Prescribed: 35
Plan Total Prescribed Dose: 70 Gy
Reference Point Dosage Given to Date: 2 Gy
Reference Point Session Dosage Given: 2 Gy
Session Number: 1

## 2023-12-21 NOTE — Telephone Encounter (Signed)
If she does not want pet she needs ct chest abdomen pelvis with contrast. Can that be offered at [facility] on Tuesdays and does she want that?

## 2023-12-21 NOTE — Telephone Encounter (Signed)
Notification received that [facility] does not offer PET scans on Tuesdays and that Michelle Hays declined scheduling. Called and spoke with her and offered an appointment at [facility] on a Tuesday and she reports she does not have transportation to [place] so she declined scheduling. She has not heard regarding brain MRI yet. Will notify Dr. Smith Robert.

## 2023-12-21 NOTE — Telephone Encounter (Signed)
error

## 2023-12-22 ENCOUNTER — Ambulatory Visit
Admission: RE | Admit: 2023-12-22 | Discharge: 2023-12-22 | Disposition: A | Discharge status: Home / Self Care | Payer: Medicare PPO | Source: Ambulatory Visit | Attending: Radiation Oncology | Admitting: Radiation Oncology

## 2023-12-22 ENCOUNTER — Other Ambulatory Visit: Payer: Self-pay

## 2023-12-22 DIAGNOSIS — C3490 Malignant neoplasm of unspecified part of unspecified bronchus or lung: Secondary | ICD-10-CM

## 2023-12-22 DIAGNOSIS — Z510 Encounter for antineoplastic radiation therapy: Secondary | ICD-10-CM | POA: Diagnosis not present

## 2023-12-22 LAB — RAD ONC ARIA SESSION SUMMARY
Course Elapsed Days: 1
Plan Fractions Treated to Date: 2
Plan Prescribed Dose Per Fraction: 2 Gy
Plan Total Fractions Prescribed: 35
Plan Total Prescribed Dose: 70 Gy
Reference Point Dosage Given to Date: 4 Gy
Reference Point Session Dosage Given: 2 Gy
Session Number: 2

## 2023-12-22 NOTE — Telephone Encounter (Signed)
Spoke to Michelle Hays while she was here for radiation today. She states she will have a CT CAP performed as long as it it can be done on a Tuesday. I have checked with [facility] and this is available. She states she has not heard from scheduling regarding her MRI. Per scheduling she was contacted and refused scheduling. I have asked them to reach back out to her and offer scheduling again. Order for CT placed and will be faxed along with authorization.

## 2023-12-25 ENCOUNTER — Ambulatory Visit
Admission: RE | Admit: 2023-12-25 | Discharge: 2023-12-25 | Disposition: A | Discharge status: Home / Self Care | Payer: Medicare PPO | Source: Ambulatory Visit | Attending: Radiation Oncology | Admitting: Radiation Oncology

## 2023-12-25 ENCOUNTER — Inpatient Hospital Stay: Payer: Medicare PPO

## 2023-12-25 ENCOUNTER — Other Ambulatory Visit: Payer: Self-pay

## 2023-12-25 ENCOUNTER — Telehealth: Payer: Self-pay

## 2023-12-25 DIAGNOSIS — R0981 Nasal congestion: Secondary | ICD-10-CM | POA: Insufficient documentation

## 2023-12-25 DIAGNOSIS — C3431 Malignant neoplasm of lower lobe, right bronchus or lung: Secondary | ICD-10-CM | POA: Diagnosis not present

## 2023-12-25 DIAGNOSIS — J449 Chronic obstructive pulmonary disease, unspecified: Secondary | ICD-10-CM | POA: Insufficient documentation

## 2023-12-25 DIAGNOSIS — R918 Other nonspecific abnormal finding of lung field: Secondary | ICD-10-CM

## 2023-12-25 DIAGNOSIS — Z510 Encounter for antineoplastic radiation therapy: Secondary | ICD-10-CM | POA: Insufficient documentation

## 2023-12-25 DIAGNOSIS — C3490 Malignant neoplasm of unspecified part of unspecified bronchus or lung: Secondary | ICD-10-CM | POA: Insufficient documentation

## 2023-12-25 DIAGNOSIS — J3489 Other specified disorders of nose and nasal sinuses: Secondary | ICD-10-CM | POA: Insufficient documentation

## 2023-12-25 DIAGNOSIS — Z8542 Personal history of malignant neoplasm of other parts of uterus: Secondary | ICD-10-CM | POA: Insufficient documentation

## 2023-12-25 DIAGNOSIS — R600 Localized edema: Secondary | ICD-10-CM | POA: Insufficient documentation

## 2023-12-25 LAB — CBC (CANCER CENTER ONLY)
HCT: 39.5 % (ref 36.0–46.0)
Hemoglobin: 12.7 g/dL (ref 12.0–15.0)
MCH: 27.5 pg (ref 26.0–34.0)
MCHC: 32.2 g/dL (ref 30.0–36.0)
MCV: 85.7 fL (ref 80.0–100.0)
Platelet Count: 337 10*3/uL (ref 150–400)
RBC: 4.61 MIL/uL (ref 3.87–5.11)
RDW: 13.6 % (ref 11.5–15.5)
WBC Count: 10.9 10*3/uL — ABNORMAL HIGH (ref 4.0–10.5)
nRBC: 0 % (ref 0.0–0.2)

## 2023-12-25 LAB — RAD ONC ARIA SESSION SUMMARY
Course Elapsed Days: 4
Plan Fractions Treated to Date: 3
Plan Prescribed Dose Per Fraction: 2 Gy
Plan Total Fractions Prescribed: 35
Plan Total Prescribed Dose: 70 Gy
Reference Point Dosage Given to Date: 6 Gy
Reference Point Session Dosage Given: 2 Gy
Session Number: 3

## 2023-12-25 NOTE — Telephone Encounter (Signed)
Order for CT CAP with authorization, faxed to [facility] [phone] with confirmation of receipt.

## 2023-12-26 ENCOUNTER — Ambulatory Visit: Payer: Medicare PPO

## 2023-12-26 ENCOUNTER — Telehealth: Payer: Self-pay | Admitting: Radiation Oncology

## 2023-12-26 NOTE — Telephone Encounter (Signed)
Patient called to cancel radiation appointment for this afternoon due to having the flu- call sent to radiation department

## 2023-12-27 ENCOUNTER — Telehealth: Payer: Self-pay | Admitting: Radiation Oncology

## 2023-12-27 ENCOUNTER — Ambulatory Visit: Payer: Medicare PPO

## 2023-12-27 ENCOUNTER — Encounter: Payer: Self-pay | Admitting: *Deleted

## 2023-12-27 NOTE — Telephone Encounter (Signed)
 Pt called and left vm that she is sick and will not make her appt.

## 2023-12-28 ENCOUNTER — Ambulatory Visit: Payer: Medicare PPO

## 2023-12-29 ENCOUNTER — Ambulatory Visit: Payer: Medicare PPO

## 2024-01-01 ENCOUNTER — Inpatient Hospital Stay: Payer: Medicare PPO

## 2024-01-01 ENCOUNTER — Ambulatory Visit
Admission: RE | Admit: 2024-01-01 | Discharge: 2024-01-01 | Disposition: A | Discharge status: Home / Self Care | Payer: Medicare PPO | Source: Ambulatory Visit | Attending: Radiation Oncology | Admitting: Radiation Oncology

## 2024-01-01 ENCOUNTER — Other Ambulatory Visit: Payer: Self-pay

## 2024-01-01 DIAGNOSIS — R918 Other nonspecific abnormal finding of lung field: Secondary | ICD-10-CM

## 2024-01-01 DIAGNOSIS — Z510 Encounter for antineoplastic radiation therapy: Secondary | ICD-10-CM | POA: Diagnosis not present

## 2024-01-01 LAB — CBC (CANCER CENTER ONLY)
HCT: 39.4 % (ref 36.0–46.0)
Hemoglobin: 12.6 g/dL (ref 12.0–15.0)
MCH: 27.4 pg (ref 26.0–34.0)
MCHC: 32 g/dL (ref 30.0–36.0)
MCV: 85.7 fL (ref 80.0–100.0)
Platelet Count: 332 10*3/uL (ref 150–400)
RBC: 4.6 MIL/uL (ref 3.87–5.11)
RDW: 13.6 % (ref 11.5–15.5)
WBC Count: 9.4 10*3/uL (ref 4.0–10.5)
nRBC: 0 % (ref 0.0–0.2)

## 2024-01-01 LAB — RAD ONC ARIA SESSION SUMMARY
Course Elapsed Days: 11
Plan Fractions Treated to Date: 4
Plan Prescribed Dose Per Fraction: 2 Gy
Plan Total Fractions Prescribed: 35
Plan Total Prescribed Dose: 70 Gy
Reference Point Dosage Given to Date: 8 Gy
Reference Point Session Dosage Given: 2 Gy
Session Number: 4

## 2024-01-02 ENCOUNTER — Telehealth: Payer: Self-pay | Admitting: Oncology

## 2024-01-02 ENCOUNTER — Ambulatory Visit
Admission: RE | Admit: 2024-01-02 | Discharge: 2024-01-02 | Disposition: A | Discharge status: Home / Self Care | Payer: Medicare PPO | Source: Ambulatory Visit | Attending: Radiation Oncology | Admitting: Radiation Oncology

## 2024-01-02 ENCOUNTER — Other Ambulatory Visit: Payer: Self-pay

## 2024-01-02 DIAGNOSIS — Z510 Encounter for antineoplastic radiation therapy: Secondary | ICD-10-CM | POA: Diagnosis not present

## 2024-01-02 LAB — RAD ONC ARIA SESSION SUMMARY
Course Elapsed Days: 12
Plan Fractions Treated to Date: 5
Plan Prescribed Dose Per Fraction: 2 Gy
Plan Total Fractions Prescribed: 35
Plan Total Prescribed Dose: 70 Gy
Reference Point Dosage Given to Date: 10 Gy
Reference Point Session Dosage Given: 2 Gy
Session Number: 5

## 2024-01-02 NOTE — Telephone Encounter (Signed)
Pt left a voicemail about rescheduling her upcoming appt with Dr. Smith Robert. I called and asked when she would like to reschedule and she said " I don't want to reschedule, I don't want to see Dr. Smith Robert " and then hung up the phone

## 2024-01-03 ENCOUNTER — Ambulatory Visit
Admission: RE | Admit: 2024-01-03 | Discharge: 2024-01-03 | Disposition: A | Discharge status: Home / Self Care | Payer: Medicare PPO | Source: Ambulatory Visit | Attending: Radiation Oncology | Admitting: Radiation Oncology

## 2024-01-03 ENCOUNTER — Other Ambulatory Visit: Payer: Self-pay

## 2024-01-03 ENCOUNTER — Encounter: Payer: Self-pay | Admitting: *Deleted

## 2024-01-03 DIAGNOSIS — Z510 Encounter for antineoplastic radiation therapy: Secondary | ICD-10-CM | POA: Diagnosis not present

## 2024-01-03 LAB — RAD ONC ARIA SESSION SUMMARY
Course Elapsed Days: 13
Plan Fractions Treated to Date: 6
Plan Prescribed Dose Per Fraction: 2 Gy
Plan Total Fractions Prescribed: 35
Plan Total Prescribed Dose: 70 Gy
Reference Point Dosage Given to Date: 12 Gy
Reference Point Session Dosage Given: 2 Gy
Session Number: 6

## 2024-01-04 ENCOUNTER — Other Ambulatory Visit: Payer: Self-pay

## 2024-01-04 ENCOUNTER — Ambulatory Visit
Admission: RE | Admit: 2024-01-04 | Discharge: 2024-01-04 | Disposition: A | Discharge status: Home / Self Care | Payer: Medicare PPO | Source: Ambulatory Visit | Attending: Radiation Oncology | Admitting: Radiation Oncology

## 2024-01-04 DIAGNOSIS — Z510 Encounter for antineoplastic radiation therapy: Secondary | ICD-10-CM | POA: Diagnosis not present

## 2024-01-04 LAB — RAD ONC ARIA SESSION SUMMARY
Course Elapsed Days: 14
Plan Fractions Treated to Date: 7
Plan Prescribed Dose Per Fraction: 2 Gy
Plan Total Fractions Prescribed: 35
Plan Total Prescribed Dose: 70 Gy
Reference Point Dosage Given to Date: 14 Gy
Reference Point Session Dosage Given: 2 Gy
Session Number: 7

## 2024-01-05 ENCOUNTER — Ambulatory Visit
Admission: RE | Admit: 2024-01-05 | Discharge: 2024-01-05 | Disposition: A | Discharge status: Home / Self Care | Payer: Medicare PPO | Source: Ambulatory Visit | Attending: Radiation Oncology | Admitting: Radiation Oncology

## 2024-01-05 ENCOUNTER — Other Ambulatory Visit: Payer: Self-pay

## 2024-01-05 ENCOUNTER — Ambulatory Visit: Payer: Medicare PPO | Admitting: Oncology

## 2024-01-05 DIAGNOSIS — Z510 Encounter for antineoplastic radiation therapy: Secondary | ICD-10-CM | POA: Diagnosis not present

## 2024-01-05 LAB — RAD ONC ARIA SESSION SUMMARY
Course Elapsed Days: 15
Plan Fractions Treated to Date: 8
Plan Prescribed Dose Per Fraction: 2 Gy
Plan Total Fractions Prescribed: 35
Plan Total Prescribed Dose: 70 Gy
Reference Point Dosage Given to Date: 16 Gy
Reference Point Session Dosage Given: 2 Gy
Session Number: 8

## 2024-01-08 ENCOUNTER — Inpatient Hospital Stay: Payer: Medicare PPO

## 2024-01-08 ENCOUNTER — Other Ambulatory Visit: Payer: Self-pay

## 2024-01-08 ENCOUNTER — Ambulatory Visit
Admission: RE | Admit: 2024-01-08 | Discharge: 2024-01-08 | Disposition: A | Discharge status: Home / Self Care | Payer: Medicare PPO | Source: Ambulatory Visit | Attending: Radiation Oncology | Admitting: Radiation Oncology

## 2024-01-08 DIAGNOSIS — R918 Other nonspecific abnormal finding of lung field: Secondary | ICD-10-CM

## 2024-01-08 DIAGNOSIS — Z510 Encounter for antineoplastic radiation therapy: Secondary | ICD-10-CM | POA: Diagnosis not present

## 2024-01-08 LAB — RAD ONC ARIA SESSION SUMMARY
Course Elapsed Days: 18
Plan Fractions Treated to Date: 9
Plan Prescribed Dose Per Fraction: 2 Gy
Plan Total Fractions Prescribed: 35
Plan Total Prescribed Dose: 70 Gy
Reference Point Dosage Given to Date: 18 Gy
Reference Point Session Dosage Given: 2 Gy
Session Number: 9

## 2024-01-08 LAB — CBC (CANCER CENTER ONLY)
HCT: 38.1 % (ref 36.0–46.0)
Hemoglobin: 12.3 g/dL (ref 12.0–15.0)
MCH: 28.1 pg (ref 26.0–34.0)
MCHC: 32.3 g/dL (ref 30.0–36.0)
MCV: 87.2 fL (ref 80.0–100.0)
Platelet Count: 279 10*3/uL (ref 150–400)
RBC: 4.37 MIL/uL (ref 3.87–5.11)
RDW: 14 % (ref 11.5–15.5)
WBC Count: 8.5 10*3/uL (ref 4.0–10.5)
nRBC: 0 % (ref 0.0–0.2)

## 2024-01-09 ENCOUNTER — Ambulatory Visit
Admission: RE | Admit: 2024-01-09 | Discharge: 2024-01-09 | Disposition: A | Discharge status: Home / Self Care | Payer: Medicare PPO | Source: Ambulatory Visit | Attending: Radiation Oncology | Admitting: Radiation Oncology

## 2024-01-09 ENCOUNTER — Other Ambulatory Visit: Payer: Self-pay

## 2024-01-09 DIAGNOSIS — Z510 Encounter for antineoplastic radiation therapy: Secondary | ICD-10-CM | POA: Diagnosis not present

## 2024-01-09 LAB — RAD ONC ARIA SESSION SUMMARY
Course Elapsed Days: 19
Plan Fractions Treated to Date: 10
Plan Prescribed Dose Per Fraction: 2 Gy
Plan Total Fractions Prescribed: 35
Plan Total Prescribed Dose: 70 Gy
Reference Point Dosage Given to Date: 20 Gy
Reference Point Session Dosage Given: 2 Gy
Session Number: 10

## 2024-01-10 ENCOUNTER — Ambulatory Visit: Payer: Medicare PPO

## 2024-01-11 ENCOUNTER — Ambulatory Visit: Payer: Medicare PPO

## 2024-01-12 ENCOUNTER — Other Ambulatory Visit: Payer: Self-pay

## 2024-01-12 ENCOUNTER — Ambulatory Visit
Admission: RE | Admit: 2024-01-12 | Discharge: 2024-01-12 | Disposition: A | Discharge status: Home / Self Care | Payer: Medicare PPO | Source: Ambulatory Visit | Attending: Radiation Oncology | Admitting: Radiation Oncology

## 2024-01-12 DIAGNOSIS — Z510 Encounter for antineoplastic radiation therapy: Secondary | ICD-10-CM | POA: Diagnosis not present

## 2024-01-12 LAB — RAD ONC ARIA SESSION SUMMARY
Course Elapsed Days: 22
Plan Fractions Treated to Date: 11
Plan Prescribed Dose Per Fraction: 2 Gy
Plan Total Fractions Prescribed: 35
Plan Total Prescribed Dose: 70 Gy
Reference Point Dosage Given to Date: 22 Gy
Reference Point Session Dosage Given: 2 Gy
Session Number: 11

## 2024-01-15 ENCOUNTER — Other Ambulatory Visit: Payer: Self-pay

## 2024-01-15 ENCOUNTER — Ambulatory Visit
Admission: RE | Admit: 2024-01-15 | Discharge: 2024-01-15 | Disposition: A | Discharge status: Home / Self Care | Payer: Medicare PPO | Source: Ambulatory Visit | Attending: Radiation Oncology | Admitting: Radiation Oncology

## 2024-01-15 ENCOUNTER — Inpatient Hospital Stay: Payer: Medicare PPO

## 2024-01-15 DIAGNOSIS — Z510 Encounter for antineoplastic radiation therapy: Secondary | ICD-10-CM | POA: Diagnosis not present

## 2024-01-15 DIAGNOSIS — R918 Other nonspecific abnormal finding of lung field: Secondary | ICD-10-CM

## 2024-01-15 LAB — CBC (CANCER CENTER ONLY)
HCT: 37.4 % (ref 36.0–46.0)
Hemoglobin: 12 g/dL (ref 12.0–15.0)
MCH: 28 pg (ref 26.0–34.0)
MCHC: 32.1 g/dL (ref 30.0–36.0)
MCV: 87.4 fL (ref 80.0–100.0)
Platelet Count: 198 10*3/uL (ref 150–400)
RBC: 4.28 MIL/uL (ref 3.87–5.11)
RDW: 14.5 % (ref 11.5–15.5)
WBC Count: 5.9 10*3/uL (ref 4.0–10.5)
nRBC: 0 % (ref 0.0–0.2)

## 2024-01-15 LAB — RAD ONC ARIA SESSION SUMMARY
Course Elapsed Days: 25
Plan Fractions Treated to Date: 12
Plan Prescribed Dose Per Fraction: 2 Gy
Plan Total Fractions Prescribed: 35
Plan Total Prescribed Dose: 70 Gy
Reference Point Dosage Given to Date: 24 Gy
Reference Point Session Dosage Given: 2 Gy
Session Number: 12

## 2024-01-16 ENCOUNTER — Other Ambulatory Visit: Payer: Self-pay

## 2024-01-16 ENCOUNTER — Ambulatory Visit
Admission: RE | Admit: 2024-01-16 | Discharge: 2024-01-16 | Disposition: A | Discharge status: Home / Self Care | Payer: Medicare PPO | Source: Ambulatory Visit | Attending: Radiation Oncology | Admitting: Radiation Oncology

## 2024-01-16 DIAGNOSIS — Z510 Encounter for antineoplastic radiation therapy: Secondary | ICD-10-CM | POA: Diagnosis not present

## 2024-01-16 LAB — RAD ONC ARIA SESSION SUMMARY
Course Elapsed Days: 26
Plan Fractions Treated to Date: 13
Plan Prescribed Dose Per Fraction: 2 Gy
Plan Total Fractions Prescribed: 35
Plan Total Prescribed Dose: 70 Gy
Reference Point Dosage Given to Date: 26 Gy
Reference Point Session Dosage Given: 2 Gy
Session Number: 13

## 2024-01-17 ENCOUNTER — Ambulatory Visit
Admission: RE | Admit: 2024-01-17 | Discharge: 2024-01-17 | Disposition: A | Discharge status: Home / Self Care | Payer: Medicare PPO | Source: Ambulatory Visit | Attending: Radiation Oncology | Admitting: Radiation Oncology

## 2024-01-17 ENCOUNTER — Other Ambulatory Visit: Payer: Self-pay

## 2024-01-17 ENCOUNTER — Ambulatory Visit: Payer: Medicare PPO

## 2024-01-17 ENCOUNTER — Inpatient Hospital Stay (HOSPITAL_BASED_OUTPATIENT_CLINIC_OR_DEPARTMENT_OTHER): Payer: Medicare PPO | Admitting: Hospice and Palliative Medicine

## 2024-01-17 ENCOUNTER — Inpatient Hospital Stay: Payer: Medicare PPO

## 2024-01-17 VITALS — BP 98/60 | HR 98 | Temp 97.0°F | Resp 18 | Ht 71.0 in | Wt 156.3 lb

## 2024-01-17 DIAGNOSIS — C3490 Malignant neoplasm of unspecified part of unspecified bronchus or lung: Secondary | ICD-10-CM

## 2024-01-17 DIAGNOSIS — Z510 Encounter for antineoplastic radiation therapy: Secondary | ICD-10-CM | POA: Diagnosis not present

## 2024-01-17 LAB — CBC WITH DIFFERENTIAL (CANCER CENTER ONLY)
Abs Immature Granulocytes: 0.04 10*3/uL (ref 0.00–0.07)
Basophils Absolute: 0 10*3/uL (ref 0.0–0.1)
Basophils Relative: 1 %
Eosinophils Absolute: 0.1 10*3/uL (ref 0.0–0.5)
Eosinophils Relative: 2 %
HCT: 39.1 % (ref 36.0–46.0)
Hemoglobin: 12.4 g/dL (ref 12.0–15.0)
Immature Granulocytes: 1 %
Lymphocytes Relative: 15 %
Lymphs Abs: 0.8 10*3/uL (ref 0.7–4.0)
MCH: 27.7 pg (ref 26.0–34.0)
MCHC: 31.7 g/dL (ref 30.0–36.0)
MCV: 87.3 fL (ref 80.0–100.0)
Monocytes Absolute: 0.5 10*3/uL (ref 0.1–1.0)
Monocytes Relative: 10 %
Neutro Abs: 3.9 10*3/uL (ref 1.7–7.7)
Neutrophils Relative %: 71 %
Platelet Count: 184 10*3/uL (ref 150–400)
RBC: 4.48 MIL/uL (ref 3.87–5.11)
RDW: 14.4 % (ref 11.5–15.5)
WBC Count: 5.4 10*3/uL (ref 4.0–10.5)
nRBC: 0 % (ref 0.0–0.2)

## 2024-01-17 LAB — CMP (CANCER CENTER ONLY)
ALT: 12 U/L (ref 0–44)
AST: 17 U/L (ref 15–41)
Albumin: 2.2 g/dL — ABNORMAL LOW (ref 3.5–5.0)
Alkaline Phosphatase: 74 U/L (ref 38–126)
Anion gap: 8 (ref 5–15)
BUN: 17 mg/dL (ref 8–23)
CO2: 25 mmol/L (ref 22–32)
Calcium: 8.4 mg/dL — ABNORMAL LOW (ref 8.9–10.3)
Chloride: 101 mmol/L (ref 98–111)
Creatinine: 0.83 mg/dL (ref 0.44–1.00)
GFR, Estimated: >60 mL/min (ref >60)
Glucose, Bld: 96 mg/dL (ref 70–99)
Potassium: 4.2 mmol/L (ref 3.5–5.1)
Sodium: 134 mmol/L — ABNORMAL LOW (ref 135–145)
Total Bilirubin: 0.4 mg/dL (ref 0.0–1.2)
Total Protein: 5.5 g/dL — ABNORMAL LOW (ref 6.5–8.1)

## 2024-01-17 LAB — RAD ONC ARIA SESSION SUMMARY
Course Elapsed Days: 27
Plan Fractions Treated to Date: 14
Plan Prescribed Dose Per Fraction: 2 Gy
Plan Total Fractions Prescribed: 35
Plan Total Prescribed Dose: 70 Gy
Reference Point Dosage Given to Date: 28 Gy
Reference Point Session Dosage Given: 2 Gy
Session Number: 14

## 2024-01-17 MED ORDER — BENZONATATE 100 MG PO CAPS: 100.0000 mg | ORAL_CAPSULE | Freq: Three times a day (TID) | ORAL | 0 refills | Status: DC | PRN

## 2024-01-17 NOTE — Progress Notes (Signed)
 Symptom Management Clinic [facility] [phone] [phone]  Patient Care Team: Oneal Grout, FNP as PCP - General (Family Medicine) Keitha Butte, RN as Registered Nurse Sonia Side, Philbert Riser, MD as Consulting Physician (Obstetrics and Gynecology) Schermerhorn, Ihor Austin, MD as Referring Physician (Obstetrics and Gynecology) Carmina Miller, MD as Consulting Physician (Radiation Oncology) Glory Buff, RN as Oncology Nurse Navigator   NAME OF PATIENT: Michelle Hays  [id number]  [birth year]   DATE OF VISIT: 01/17/24  REASON FOR CONSULT: Michelle Hays is a 78 y.o. female with multiple medical problems including COPD, history of stage I endometrial cancer, now with lung mass with mediastinal adenopathy.  Staging workup at least stage III lung cancer.  However, patient refused bronchoscopy and chemotherapy.  She has been treated with empiric XRT.  INTERVAL HISTORY: Patient was an add-on to [facility] for evaluation of bilateral lower extremity edema and chronic dry cough.  Patient states that she has had bilateral lower extremity edema over the past 2 weeks.  Denies calf pain.  Has not noticed any changes in edema throughout the day.  Denies shortness of breath or chest pain.  Endorses rhinorrhea and nasal congestion.  Has dry cough.  Denies sick contacts.  Denies any neurologic complaints. Denies recent fevers or illnesses. Denies any easy bleeding or bruising. Reports good appetite and denies weight loss. Denies chest pain. Denies any nausea, vomiting, constipation, or diarrhea. Denies urinary complaints. Patient offers no further specific complaints today.   PAST MEDICAL HISTORY: Past Medical History:  Diagnosis Date   Anxiety    Arthritis    Asthma    Atypical squamous cell changes of undetermined significance (ASCUS) on cervical cytology with positive high risk human papilloma virus (HPV) 03/16/2015   BP (high blood  pressure) 06/27/2014   Breast cancer (HCC)    Breast cancer, left breast (HCC) 1995   Left Breast    CAFL (chronic airflow limitation) (HCC)    COPD (chronic obstructive pulmonary disease) (HCC)    DDD (degenerative disc disease), lumbar    Depression    Diabetes mellitus without complication (HCC)    Patient takes Januvia   Endometrial cancer determined by uterine biopsy (HCC) 2016   GERD (gastroesophageal reflux disease)    Heart murmur    Hyperlipidemia    Hypertension    IBS (irritable bowel syndrome)    Migraine    Osteopenia    Stroke (HCC)     PAST SURGICAL HISTORY:  Past Surgical History:  Procedure Laterality Date   ABDOMINAL HYSTERECTOMY     BREAST LUMPECTOMY  2012   [facility]rnett-bilateral breast lumpectomy   CATARACT EXTRACTION  2013   CHOLECYSTECTOMY     CHOLECYSTECTOMY, LAPAROSCOPIC     COLONOSCOPY WITH PROPOFOL N/A 07/06/2016   Procedure: COLONOSCOPY WITH PROPOFOL;  Surgeon: Christena Deem, MD;  Location: [facility] ENDOSCOPY;  Service: Endoscopy;  Laterality: N/A;   ESOPHAGOGASTRODUODENOSCOPY  03/02/2008, 10/12/2010, 03/28/2013   ESOPHAGOGASTRODUODENOSCOPY (EGD) WITH PROPOFOL N/A 07/06/2016   Procedure: ESOPHAGOGASTRODUODENOSCOPY (EGD) WITH PROPOFOL;  Surgeon: Christena Deem, MD;  Location: [facility] ENDOSCOPY;  Service: Endoscopy;  Laterality: N/A;   EYE SURGERY     LAPAROSCOPIC BILATERAL SALPINGO OOPHERECTOMY Bilateral 05/20/2015   Procedure: LAPAROSCOPIC BILATERAL SALPINGO OOPHORECTOMY;  Surgeon: Artelia Laroche, MD;  Location: [facility] ORS;  Service: Gynecology;  Laterality: Bilateral;   ROBOTIC ASSISTED TOTAL HYSTERECTOMY N/A 05/20/2015   Procedure: ROBOTIC ASSISTED TOTAL HYSTERECTOMY;  Surgeon: Artelia Laroche, MD;  Location: [facility] ORS;  Service: Gynecology;  Laterality: N/A;    HEMATOLOGY/ONCOLOGY HISTORY:  Oncology History   No history exists.    ALLERGIES:  is allergic to sulfa antibiotics.  MEDICATIONS:  Current Outpatient Medications  Medication  Sig Dispense Refill   albuterol (PROVENTIL HFA;VENTOLIN HFA) 108 (90 BASE) MCG/ACT inhaler Inhale 2 puffs into the lungs every 4 (four) hours as needed for wheezing. For wheezing (Patient not taking: Reported on 11/28/2023)     Apoaequorin (PREVAGEN) 10 MG CAPS Take 1 capsule by mouth daily.     ARIPiprazole (ABILIFY) 5 MG tablet Take 5 mg by mouth daily.     aspirin EC 81 MG tablet Take 1 tablet by mouth daily.     atorvastatin (LIPITOR) 40 MG tablet Take 40 mg by mouth daily.     calcium-vitamin D (OSCAL WITH D) 500-200 MG-UNIT tablet Take 1 tablet by mouth. (Patient not taking: Reported on 09/26/2023)     cholecalciferol (VITAMIN D) 400 units TABS tablet Take 1,000 Units by mouth daily.     Fluticasone-Umeclidin-Vilant (TRELEGY ELLIPTA) 200-62.5-25 MCG/ACT AEPB Inhale 1 Inhalation into the lungs daily.     HYDROcodone-acetaminophen (NORCO) 10-325 MG tablet Take 1 tablet by mouth every 6 (six) hours as needed. (Patient not taking: Reported on 09/26/2023)     loperamide (IMODIUM) 2 MG capsule Take by mouth as needed for diarrhea or loose stools.     LORazepam (ATIVAN) 1 MG tablet Take 1 mg by mouth at bedtime.     magnesium oxide (MAG-OX) 400 MG tablet Take 400 mg by mouth daily.     OLANZapine (ZYPREXA) 2.5 MG tablet Take 2.5 mg by mouth daily.     omeprazole (PRILOSEC) 20 MG capsule Take 40 mg by mouth daily.     Probiotic Product (PROBIOTIC DAILY PO) Take 1 capsule by mouth daily.     sitaGLIPtin (JANUVIA) 100 MG tablet Take 100 mg by mouth daily.     sucralfate (CARAFATE) 1 GM/10ML suspension Take 1 g by mouth 4 (four) times daily -  with meals and at bedtime.     tiotropium (SPIRIVA) 18 MCG inhalation capsule Place 18 mcg into inhaler and inhale daily. (Patient not taking: Reported on 09/26/2023)     No current facility-administered medications for this visit.    VITAL SIGNS: There were no vitals taken for this visit. There were no vitals filed for this visit.  Estimated body mass index is  22.59 kg/m as calculated from the following:   Height as of 12/12/23: 5\' 11"  (1.803 m).   Weight as of 12/12/23: 162 lb (73.5 kg).  LABS: CBC:    Component Value Date/Time   WBC 5.4 01/17/2024 1323   WBC 10.1 09/26/2023 1004   HGB 12.4 01/17/2024 1323   HCT 39.1 01/17/2024 1323   PLT 184 01/17/2024 1323   MCV 87.3 01/17/2024 1323   NEUTROABS 3.9 01/17/2024 1323   LYMPHSABS 0.8 01/17/2024 1323   MONOABS 0.5 01/17/2024 1323   EOSABS 0.1 01/17/2024 1323   BASOSABS 0.0 01/17/2024 1323   Comprehensive Metabolic Panel:    Component Value Date/Time   NA 135 09/26/2023 1004   K 4.4 09/26/2023 1004   CL 103 09/26/2023 1004   CO2 25 09/26/2023 1004   BUN 14 09/26/2023 1004   CREATININE 0.87 09/26/2023 1004   GLUCOSE 102 (H) 09/26/2023 1004   CALCIUM 8.5 (L) 09/26/2023 1004   AST 16 08/15/2017 1422   ALT 12 (L) 08/15/2017 1422   ALKPHOS 71 08/15/2017 1422   BILITOT  0.2 (L) 08/15/2017 1422   PROT 6.6 08/15/2017 1422   ALBUMIN 3.1 (L) 08/15/2017 1422    RADIOGRAPHIC STUDIES: No results found.  PERFORMANCE STATUS (ECOG) : 1 - Symptomatic but completely ambulatory  Review of Systems Unless otherwise noted, a complete review of systems is negative.  Physical Exam General: NAD Cardiovascular: regular rate and rhythm Pulmonary: Expiratory wheezing Abdomen: soft, nontender, + bowel sounds GU: no suprapubic tenderness Extremities: BLE edema, no joint deformities Skin: no rashes Neurological: Weakness but otherwise nonfocal  IMPRESSION/PLAN: Lung cancer -on empiric XRT.  Patient confirms that she is not interested in any further workup or treatment.  She says that she will "take it a day at a time."  Recommend community palliative care.  Cough -likely secondary to lung cancer/treatment effects.  She does have some expiratory wheezing from COPD.  She is utilizing her inhalers as directed.  Discussed option for workup including viral respiratory panel given concurrent  rhinorrhea/nasal congestion.  However, patient declined further workup.  Also she was not interested in x-ray.  She stated that she just wants something for cough.  She has tried Mucinex without improvement.  Will prescribe benzonatate.  Bilateral lower extremity edema -offered ultrasound to rule out DVT.  Patient stated she understood the risk but was not interested in workup.  She agreed to conservative measures including elevating feet and compression stockings.   Patient expressed understanding and was in agreement with this plan. She also understands that She can call clinic at any time with any questions, concerns, or complaints.   Thank you for allowing me to participate in the care of this very pleasant patient.   Time Total: 15 minutes  Visit consisted of counseling and education dealing with the complex and emotionally intense issues of symptom management in the setting of serious illness.Greater than 50%  of this time was spent counseling and coordinating care related to the above assessment and plan.  Signed by: Laurette Schimke, PhD, NP-C

## 2024-01-18 ENCOUNTER — Other Ambulatory Visit: Payer: Self-pay

## 2024-01-18 ENCOUNTER — Ambulatory Visit
Admission: RE | Admit: 2024-01-18 | Discharge: 2024-01-18 | Disposition: A | Discharge status: Home / Self Care | Payer: Medicare PPO | Source: Ambulatory Visit | Attending: Radiation Oncology | Admitting: Radiation Oncology

## 2024-01-18 DIAGNOSIS — Z510 Encounter for antineoplastic radiation therapy: Secondary | ICD-10-CM | POA: Diagnosis not present

## 2024-01-18 LAB — RAD ONC ARIA SESSION SUMMARY
Course Elapsed Days: 28
Plan Fractions Treated to Date: 15
Plan Prescribed Dose Per Fraction: 2 Gy
Plan Total Fractions Prescribed: 35
Plan Total Prescribed Dose: 70 Gy
Reference Point Dosage Given to Date: 30 Gy
Reference Point Session Dosage Given: 2 Gy
Session Number: 15

## 2024-01-19 ENCOUNTER — Other Ambulatory Visit: Payer: Self-pay

## 2024-01-19 ENCOUNTER — Ambulatory Visit
Admission: RE | Admit: 2024-01-19 | Discharge: 2024-01-19 | Disposition: A | Discharge status: Home / Self Care | Payer: Medicare PPO | Source: Ambulatory Visit | Attending: Radiation Oncology | Admitting: Radiation Oncology

## 2024-01-19 DIAGNOSIS — Z510 Encounter for antineoplastic radiation therapy: Secondary | ICD-10-CM | POA: Diagnosis not present

## 2024-01-19 LAB — RAD ONC ARIA SESSION SUMMARY
Course Elapsed Days: 29
Plan Fractions Treated to Date: 16
Plan Prescribed Dose Per Fraction: 2 Gy
Plan Total Fractions Prescribed: 35
Plan Total Prescribed Dose: 70 Gy
Reference Point Dosage Given to Date: 32 Gy
Reference Point Session Dosage Given: 2 Gy
Session Number: 16

## 2024-01-21 ENCOUNTER — Ambulatory Visit: Payer: Medicare PPO

## 2024-01-22 ENCOUNTER — Ambulatory Visit

## 2024-01-22 ENCOUNTER — Ambulatory Visit: Payer: Medicare PPO

## 2024-01-22 ENCOUNTER — Inpatient Hospital Stay: Payer: Medicare PPO

## 2024-01-22 DIAGNOSIS — R911 Solitary pulmonary nodule: Secondary | ICD-10-CM | POA: Insufficient documentation

## 2024-01-22 DIAGNOSIS — Z8542 Personal history of malignant neoplasm of other parts of uterus: Secondary | ICD-10-CM | POA: Insufficient documentation

## 2024-01-23 ENCOUNTER — Ambulatory Visit

## 2024-01-23 ENCOUNTER — Ambulatory Visit: Payer: Medicare PPO

## 2024-01-24 ENCOUNTER — Other Ambulatory Visit: Payer: Self-pay

## 2024-01-24 ENCOUNTER — Other Ambulatory Visit: Payer: Self-pay | Admitting: Hospice and Palliative Medicine

## 2024-01-24 ENCOUNTER — Ambulatory Visit
Admission: RE | Admit: 2024-01-24 | Discharge: 2024-01-24 | Disposition: A | Discharge status: Home / Self Care | Payer: Medicare PPO | Source: Ambulatory Visit | Attending: Radiation Oncology | Admitting: Radiation Oncology

## 2024-01-24 ENCOUNTER — Other Ambulatory Visit: Payer: Self-pay | Admitting: *Deleted

## 2024-01-24 DIAGNOSIS — C3431 Malignant neoplasm of lower lobe, right bronchus or lung: Secondary | ICD-10-CM | POA: Diagnosis not present

## 2024-01-24 DIAGNOSIS — Z510 Encounter for antineoplastic radiation therapy: Secondary | ICD-10-CM | POA: Insufficient documentation

## 2024-01-24 LAB — RAD ONC ARIA SESSION SUMMARY
Course Elapsed Days: 34
Plan Fractions Treated to Date: 17
Plan Prescribed Dose Per Fraction: 2 Gy
Plan Total Fractions Prescribed: 35
Plan Total Prescribed Dose: 70 Gy
Reference Point Dosage Given to Date: 34 Gy
Reference Point Session Dosage Given: 2 Gy
Session Number: 17

## 2024-01-24 MED ORDER — HYDROCOD POLI-CHLORPHE POLI ER 10-8 MG/5ML PO SUER: 5.0000 mL | Freq: Two times a day (BID) | ORAL | 0 refills | Status: DC | PRN

## 2024-01-25 ENCOUNTER — Other Ambulatory Visit: Payer: Self-pay

## 2024-01-25 ENCOUNTER — Ambulatory Visit
Admission: RE | Admit: 2024-01-25 | Discharge: 2024-01-25 | Disposition: A | Discharge status: Home / Self Care | Payer: Medicare PPO | Source: Ambulatory Visit | Attending: Radiation Oncology | Admitting: Radiation Oncology

## 2024-01-25 DIAGNOSIS — Z510 Encounter for antineoplastic radiation therapy: Secondary | ICD-10-CM | POA: Diagnosis not present

## 2024-01-25 LAB — RAD ONC ARIA SESSION SUMMARY
Course Elapsed Days: 35
Plan Fractions Treated to Date: 18
Plan Prescribed Dose Per Fraction: 2 Gy
Plan Total Fractions Prescribed: 35
Plan Total Prescribed Dose: 70 Gy
Reference Point Dosage Given to Date: 36 Gy
Reference Point Session Dosage Given: 2 Gy
Session Number: 18

## 2024-01-26 ENCOUNTER — Other Ambulatory Visit: Payer: Self-pay

## 2024-01-26 ENCOUNTER — Ambulatory Visit
Admission: RE | Admit: 2024-01-26 | Discharge: 2024-01-26 | Disposition: A | Discharge status: Home / Self Care | Payer: Medicare PPO | Source: Ambulatory Visit | Attending: Radiation Oncology | Admitting: Radiation Oncology

## 2024-01-26 DIAGNOSIS — Z510 Encounter for antineoplastic radiation therapy: Secondary | ICD-10-CM | POA: Diagnosis not present

## 2024-01-26 LAB — RAD ONC ARIA SESSION SUMMARY
Course Elapsed Days: 36
Plan Fractions Treated to Date: 19
Plan Prescribed Dose Per Fraction: 2 Gy
Plan Total Fractions Prescribed: 35
Plan Total Prescribed Dose: 70 Gy
Reference Point Dosage Given to Date: 38 Gy
Reference Point Session Dosage Given: 2 Gy
Session Number: 19

## 2024-01-27 ENCOUNTER — Ambulatory Visit: Payer: Medicare PPO

## 2024-01-29 ENCOUNTER — Ambulatory Visit
Admission: RE | Admit: 2024-01-29 | Discharge: 2024-01-29 | Disposition: A | Discharge status: Home / Self Care | Payer: Medicare PPO | Source: Ambulatory Visit | Attending: Radiation Oncology | Admitting: Radiation Oncology

## 2024-01-29 ENCOUNTER — Other Ambulatory Visit: Payer: Self-pay | Admitting: *Deleted

## 2024-01-29 ENCOUNTER — Other Ambulatory Visit: Payer: Self-pay

## 2024-01-29 ENCOUNTER — Inpatient Hospital Stay: Payer: Medicare PPO

## 2024-01-29 DIAGNOSIS — Z510 Encounter for antineoplastic radiation therapy: Secondary | ICD-10-CM | POA: Diagnosis not present

## 2024-01-29 DIAGNOSIS — R918 Other nonspecific abnormal finding of lung field: Secondary | ICD-10-CM

## 2024-01-29 LAB — CBC (CANCER CENTER ONLY)
HCT: 38.9 % (ref 36.0–46.0)
Hemoglobin: 12.5 g/dL (ref 12.0–15.0)
MCH: 27.9 pg (ref 26.0–34.0)
MCHC: 32.1 g/dL (ref 30.0–36.0)
MCV: 86.8 fL (ref 80.0–100.0)
Platelet Count: 224 10*3/uL (ref 150–400)
RBC: 4.48 MIL/uL (ref 3.87–5.11)
RDW: 14.5 % (ref 11.5–15.5)
WBC Count: 5.4 10*3/uL (ref 4.0–10.5)
nRBC: 0 % (ref 0.0–0.2)

## 2024-01-29 LAB — RAD ONC ARIA SESSION SUMMARY
Course Elapsed Days: 39
Plan Fractions Treated to Date: 20
Plan Prescribed Dose Per Fraction: 2 Gy
Plan Total Fractions Prescribed: 35
Plan Total Prescribed Dose: 70 Gy
Reference Point Dosage Given to Date: 40 Gy
Reference Point Session Dosage Given: 2 Gy
Session Number: 20

## 2024-01-30 ENCOUNTER — Other Ambulatory Visit: Payer: Self-pay

## 2024-01-30 ENCOUNTER — Ambulatory Visit
Admission: RE | Admit: 2024-01-30 | Discharge: 2024-01-30 | Disposition: A | Discharge status: Home / Self Care | Payer: Medicare PPO | Source: Ambulatory Visit | Attending: Radiation Oncology | Admitting: Radiation Oncology

## 2024-01-30 DIAGNOSIS — Z510 Encounter for antineoplastic radiation therapy: Secondary | ICD-10-CM | POA: Diagnosis not present

## 2024-01-30 LAB — RAD ONC ARIA SESSION SUMMARY
Course Elapsed Days: 40
Plan Fractions Treated to Date: 21
Plan Prescribed Dose Per Fraction: 2 Gy
Plan Total Fractions Prescribed: 35
Plan Total Prescribed Dose: 70 Gy
Reference Point Dosage Given to Date: 42 Gy
Reference Point Session Dosage Given: 2 Gy
Session Number: 21

## 2024-01-31 ENCOUNTER — Other Ambulatory Visit: Payer: Self-pay

## 2024-01-31 ENCOUNTER — Ambulatory Visit
Admission: RE | Admit: 2024-01-31 | Discharge: 2024-01-31 | Disposition: A | Discharge status: Home / Self Care | Payer: Medicare PPO | Source: Ambulatory Visit | Attending: Radiation Oncology | Admitting: Radiation Oncology

## 2024-01-31 DIAGNOSIS — Z510 Encounter for antineoplastic radiation therapy: Secondary | ICD-10-CM | POA: Diagnosis not present

## 2024-01-31 LAB — RAD ONC ARIA SESSION SUMMARY
Course Elapsed Days: 41
Plan Fractions Treated to Date: 22
Plan Prescribed Dose Per Fraction: 2 Gy
Plan Total Fractions Prescribed: 35
Plan Total Prescribed Dose: 70 Gy
Reference Point Dosage Given to Date: 44 Gy
Reference Point Session Dosage Given: 2 Gy
Session Number: 22

## 2024-02-01 ENCOUNTER — Other Ambulatory Visit: Payer: Self-pay

## 2024-02-01 ENCOUNTER — Ambulatory Visit
Admission: RE | Admit: 2024-02-01 | Discharge: 2024-02-01 | Disposition: A | Discharge status: Home / Self Care | Payer: Medicare PPO | Source: Ambulatory Visit | Attending: Radiation Oncology | Admitting: Radiation Oncology

## 2024-02-01 DIAGNOSIS — Z510 Encounter for antineoplastic radiation therapy: Secondary | ICD-10-CM | POA: Diagnosis not present

## 2024-02-01 LAB — RAD ONC ARIA SESSION SUMMARY
Course Elapsed Days: 42
Plan Fractions Treated to Date: 23
Plan Prescribed Dose Per Fraction: 2 Gy
Plan Total Fractions Prescribed: 35
Plan Total Prescribed Dose: 70 Gy
Reference Point Dosage Given to Date: 46 Gy
Reference Point Session Dosage Given: 2 Gy
Session Number: 23

## 2024-02-02 ENCOUNTER — Ambulatory Visit
Admission: RE | Admit: 2024-02-02 | Discharge: 2024-02-02 | Disposition: A | Discharge status: Home / Self Care | Payer: Medicare PPO | Source: Ambulatory Visit | Attending: Radiation Oncology | Admitting: Radiation Oncology

## 2024-02-02 ENCOUNTER — Other Ambulatory Visit: Payer: Self-pay

## 2024-02-02 DIAGNOSIS — Z510 Encounter for antineoplastic radiation therapy: Secondary | ICD-10-CM | POA: Diagnosis not present

## 2024-02-02 LAB — RAD ONC ARIA SESSION SUMMARY
Course Elapsed Days: 43
Plan Fractions Treated to Date: 24
Plan Prescribed Dose Per Fraction: 2 Gy
Plan Total Fractions Prescribed: 35
Plan Total Prescribed Dose: 70 Gy
Reference Point Dosage Given to Date: 48 Gy
Reference Point Session Dosage Given: 2 Gy
Session Number: 24

## 2024-02-05 ENCOUNTER — Inpatient Hospital Stay: Payer: Medicare PPO

## 2024-02-05 ENCOUNTER — Other Ambulatory Visit: Payer: Self-pay

## 2024-02-05 ENCOUNTER — Ambulatory Visit
Admission: RE | Admit: 2024-02-05 | Discharge: 2024-02-05 | Disposition: A | Discharge status: Home / Self Care | Payer: Medicare PPO | Source: Ambulatory Visit | Attending: Radiation Oncology | Admitting: Radiation Oncology

## 2024-02-05 DIAGNOSIS — R918 Other nonspecific abnormal finding of lung field: Secondary | ICD-10-CM

## 2024-02-05 DIAGNOSIS — Z510 Encounter for antineoplastic radiation therapy: Secondary | ICD-10-CM | POA: Diagnosis not present

## 2024-02-05 LAB — RAD ONC ARIA SESSION SUMMARY
Course Elapsed Days: 46
Plan Fractions Treated to Date: 25
Plan Prescribed Dose Per Fraction: 2 Gy
Plan Total Fractions Prescribed: 35
Plan Total Prescribed Dose: 70 Gy
Reference Point Dosage Given to Date: 50 Gy
Reference Point Session Dosage Given: 2 Gy
Session Number: 25

## 2024-02-05 LAB — CBC (CANCER CENTER ONLY)
HCT: 39.2 % (ref 36.0–46.0)
Hemoglobin: 12.4 g/dL (ref 12.0–15.0)
MCH: 27.8 pg (ref 26.0–34.0)
MCHC: 31.6 g/dL (ref 30.0–36.0)
MCV: 87.9 fL (ref 80.0–100.0)
Platelet Count: 326 10*3/uL (ref 150–400)
RBC: 4.46 MIL/uL (ref 3.87–5.11)
RDW: 14.7 % (ref 11.5–15.5)
WBC Count: 6.8 10*3/uL (ref 4.0–10.5)
nRBC: 0 % (ref 0.0–0.2)

## 2024-02-06 ENCOUNTER — Other Ambulatory Visit: Payer: Self-pay

## 2024-02-06 ENCOUNTER — Ambulatory Visit
Admission: RE | Admit: 2024-02-06 | Discharge: 2024-02-06 | Disposition: A | Discharge status: Home / Self Care | Payer: Medicare PPO | Source: Ambulatory Visit | Attending: Radiation Oncology | Admitting: Radiation Oncology

## 2024-02-06 DIAGNOSIS — Z510 Encounter for antineoplastic radiation therapy: Secondary | ICD-10-CM | POA: Diagnosis not present

## 2024-02-06 LAB — RAD ONC ARIA SESSION SUMMARY
Course Elapsed Days: 47
Plan Fractions Treated to Date: 26
Plan Prescribed Dose Per Fraction: 2 Gy
Plan Total Fractions Prescribed: 35
Plan Total Prescribed Dose: 70 Gy
Reference Point Dosage Given to Date: 52 Gy
Reference Point Session Dosage Given: 2 Gy
Session Number: 26

## 2024-02-07 ENCOUNTER — Ambulatory Visit: Payer: Medicare PPO

## 2024-02-07 ENCOUNTER — Other Ambulatory Visit: Payer: Self-pay

## 2024-02-07 ENCOUNTER — Ambulatory Visit
Admission: RE | Admit: 2024-02-07 | Discharge: 2024-02-07 | Disposition: A | Discharge status: Home / Self Care | Payer: Medicare PPO | Source: Ambulatory Visit | Attending: Radiation Oncology | Admitting: Radiation Oncology

## 2024-02-07 DIAGNOSIS — Z510 Encounter for antineoplastic radiation therapy: Secondary | ICD-10-CM | POA: Diagnosis not present

## 2024-02-07 LAB — RAD ONC ARIA SESSION SUMMARY
Course Elapsed Days: 48
Plan Fractions Treated to Date: 27
Plan Prescribed Dose Per Fraction: 2 Gy
Plan Total Fractions Prescribed: 35
Plan Total Prescribed Dose: 70 Gy
Reference Point Dosage Given to Date: 54 Gy
Reference Point Session Dosage Given: 2 Gy
Session Number: 27

## 2024-02-08 ENCOUNTER — Ambulatory Visit: Payer: Medicare PPO

## 2024-02-08 ENCOUNTER — Other Ambulatory Visit: Payer: Self-pay

## 2024-02-08 ENCOUNTER — Ambulatory Visit
Admission: RE | Admit: 2024-02-08 | Discharge: 2024-02-08 | Disposition: A | Discharge status: Home / Self Care | Payer: Medicare PPO | Source: Ambulatory Visit | Attending: Radiation Oncology | Admitting: Radiation Oncology

## 2024-02-08 DIAGNOSIS — Z510 Encounter for antineoplastic radiation therapy: Secondary | ICD-10-CM | POA: Diagnosis not present

## 2024-02-08 LAB — RAD ONC ARIA SESSION SUMMARY
Course Elapsed Days: 49
Plan Fractions Treated to Date: 28
Plan Prescribed Dose Per Fraction: 2 Gy
Plan Total Fractions Prescribed: 35
Plan Total Prescribed Dose: 70 Gy
Reference Point Dosage Given to Date: 56 Gy
Reference Point Session Dosage Given: 2 Gy
Session Number: 28

## 2024-02-09 ENCOUNTER — Ambulatory Visit: Payer: Medicare PPO

## 2024-02-09 ENCOUNTER — Ambulatory Visit
Admission: RE | Admit: 2024-02-09 | Discharge: 2024-02-09 | Disposition: A | Discharge status: Home / Self Care | Payer: Medicare PPO | Source: Ambulatory Visit | Attending: Radiation Oncology | Admitting: Radiation Oncology

## 2024-02-09 ENCOUNTER — Other Ambulatory Visit: Payer: Self-pay

## 2024-02-09 DIAGNOSIS — Z510 Encounter for antineoplastic radiation therapy: Secondary | ICD-10-CM | POA: Diagnosis not present

## 2024-02-09 LAB — RAD ONC ARIA SESSION SUMMARY
Course Elapsed Days: 50
Plan Fractions Treated to Date: 29
Plan Prescribed Dose Per Fraction: 2 Gy
Plan Total Fractions Prescribed: 35
Plan Total Prescribed Dose: 70 Gy
Reference Point Dosage Given to Date: 58 Gy
Reference Point Session Dosage Given: 2 Gy
Session Number: 29

## 2024-02-12 ENCOUNTER — Other Ambulatory Visit: Payer: Self-pay

## 2024-02-12 ENCOUNTER — Inpatient Hospital Stay: Payer: Medicare PPO

## 2024-02-12 ENCOUNTER — Ambulatory Visit
Admission: RE | Admit: 2024-02-12 | Discharge: 2024-02-12 | Disposition: A | Discharge status: Home / Self Care | Payer: Medicare PPO | Source: Ambulatory Visit | Attending: Radiation Oncology | Admitting: Radiation Oncology

## 2024-02-12 ENCOUNTER — Other Ambulatory Visit: Payer: Self-pay | Admitting: *Deleted

## 2024-02-12 ENCOUNTER — Ambulatory Visit: Payer: Medicare PPO

## 2024-02-12 DIAGNOSIS — R918 Other nonspecific abnormal finding of lung field: Secondary | ICD-10-CM

## 2024-02-12 DIAGNOSIS — Z510 Encounter for antineoplastic radiation therapy: Secondary | ICD-10-CM | POA: Diagnosis not present

## 2024-02-12 LAB — RAD ONC ARIA SESSION SUMMARY
Course Elapsed Days: 53
Plan Fractions Treated to Date: 30
Plan Prescribed Dose Per Fraction: 2 Gy
Plan Total Fractions Prescribed: 35
Plan Total Prescribed Dose: 70 Gy
Reference Point Dosage Given to Date: 60 Gy
Reference Point Session Dosage Given: 2 Gy
Session Number: 30

## 2024-02-12 LAB — CBC (CANCER CENTER ONLY)
HCT: 37.3 % (ref 36.0–46.0)
Hemoglobin: 11.9 g/dL — ABNORMAL LOW (ref 12.0–15.0)
MCH: 28.1 pg (ref 26.0–34.0)
MCHC: 31.9 g/dL (ref 30.0–36.0)
MCV: 88.2 fL (ref 80.0–100.0)
Platelet Count: 266 10*3/uL (ref 150–400)
RBC: 4.23 MIL/uL (ref 3.87–5.11)
RDW: 14.9 % (ref 11.5–15.5)
WBC Count: 7 10*3/uL (ref 4.0–10.5)
nRBC: 0 % (ref 0.0–0.2)

## 2024-02-13 ENCOUNTER — Ambulatory Visit
Admission: RE | Admit: 2024-02-13 | Discharge: 2024-02-13 | Disposition: A | Discharge status: Home / Self Care | Payer: Medicare PPO | Source: Ambulatory Visit | Attending: Radiation Oncology | Admitting: Radiation Oncology

## 2024-02-13 ENCOUNTER — Other Ambulatory Visit: Payer: Self-pay

## 2024-02-13 ENCOUNTER — Ambulatory Visit: Payer: Medicare PPO

## 2024-02-13 DIAGNOSIS — Z510 Encounter for antineoplastic radiation therapy: Secondary | ICD-10-CM | POA: Diagnosis not present

## 2024-02-13 LAB — RAD ONC ARIA SESSION SUMMARY
Course Elapsed Days: 54
Plan Fractions Treated to Date: 31
Plan Prescribed Dose Per Fraction: 2 Gy
Plan Total Fractions Prescribed: 35
Plan Total Prescribed Dose: 70 Gy
Reference Point Dosage Given to Date: 62 Gy
Reference Point Session Dosage Given: 2 Gy
Session Number: 31

## 2024-02-14 ENCOUNTER — Ambulatory Visit
Admission: RE | Admit: 2024-02-14 | Discharge: 2024-02-14 | Disposition: A | Discharge status: Home / Self Care | Payer: Medicare PPO | Source: Ambulatory Visit | Attending: Radiation Oncology | Admitting: Radiation Oncology

## 2024-02-14 ENCOUNTER — Ambulatory Visit: Payer: Medicare PPO

## 2024-02-14 ENCOUNTER — Other Ambulatory Visit: Payer: Self-pay

## 2024-02-14 DIAGNOSIS — Z510 Encounter for antineoplastic radiation therapy: Secondary | ICD-10-CM | POA: Diagnosis not present

## 2024-02-14 LAB — RAD ONC ARIA SESSION SUMMARY
Course Elapsed Days: 55
Plan Fractions Treated to Date: 32
Plan Prescribed Dose Per Fraction: 2 Gy
Plan Total Fractions Prescribed: 35
Plan Total Prescribed Dose: 70 Gy
Reference Point Dosage Given to Date: 64 Gy
Reference Point Session Dosage Given: 2 Gy
Session Number: 32

## 2024-02-15 ENCOUNTER — Ambulatory Visit
Admission: RE | Admit: 2024-02-15 | Discharge: 2024-02-15 | Disposition: A | Discharge status: Home / Self Care | Source: Ambulatory Visit | Attending: Radiation Oncology | Admitting: Radiation Oncology

## 2024-02-15 ENCOUNTER — Ambulatory Visit: Payer: Medicare PPO

## 2024-02-15 ENCOUNTER — Other Ambulatory Visit: Payer: Self-pay

## 2024-02-15 DIAGNOSIS — Z510 Encounter for antineoplastic radiation therapy: Secondary | ICD-10-CM | POA: Diagnosis not present

## 2024-02-15 LAB — RAD ONC ARIA SESSION SUMMARY
Course Elapsed Days: 56
Plan Fractions Treated to Date: 33
Plan Prescribed Dose Per Fraction: 2 Gy
Plan Total Fractions Prescribed: 35
Plan Total Prescribed Dose: 70 Gy
Reference Point Dosage Given to Date: 66 Gy
Reference Point Session Dosage Given: 2 Gy
Session Number: 33

## 2024-02-16 ENCOUNTER — Ambulatory Visit
Admission: RE | Admit: 2024-02-16 | Discharge: 2024-02-16 | Disposition: A | Discharge status: Home / Self Care | Source: Ambulatory Visit | Attending: Radiation Oncology | Admitting: Radiation Oncology

## 2024-02-16 ENCOUNTER — Other Ambulatory Visit: Payer: Self-pay

## 2024-02-16 ENCOUNTER — Ambulatory Visit

## 2024-02-16 DIAGNOSIS — Z510 Encounter for antineoplastic radiation therapy: Secondary | ICD-10-CM | POA: Diagnosis not present

## 2024-02-16 LAB — RAD ONC ARIA SESSION SUMMARY
Course Elapsed Days: 57
Plan Fractions Treated to Date: 34
Plan Prescribed Dose Per Fraction: 2 Gy
Plan Total Fractions Prescribed: 35
Plan Total Prescribed Dose: 70 Gy
Reference Point Dosage Given to Date: 68 Gy
Reference Point Session Dosage Given: 2 Gy
Session Number: 34

## 2024-02-19 ENCOUNTER — Ambulatory Visit
Admission: RE | Admit: 2024-02-19 | Discharge: 2024-02-19 | Disposition: A | Discharge status: Home / Self Care | Source: Ambulatory Visit | Attending: Radiation Oncology | Admitting: Radiation Oncology

## 2024-02-19 ENCOUNTER — Other Ambulatory Visit: Payer: Self-pay

## 2024-02-19 DIAGNOSIS — Z510 Encounter for antineoplastic radiation therapy: Secondary | ICD-10-CM | POA: Diagnosis not present

## 2024-02-19 LAB — RAD ONC ARIA SESSION SUMMARY
Course Elapsed Days: 60
Plan Fractions Treated to Date: 35
Plan Prescribed Dose Per Fraction: 2 Gy
Plan Total Fractions Prescribed: 35
Plan Total Prescribed Dose: 70 Gy
Reference Point Dosage Given to Date: 70 Gy
Reference Point Session Dosage Given: 2 Gy
Session Number: 35

## 2024-02-20 NOTE — Radiation Completion Notes (Signed)
 Patient Name: Michelle Hays, MCLEAR MRN: [id number] Date of Birth: May 31, 1946 Referring Physician: Ned Clines, M.D. Date of Service: 2024-02-20 Radiation Oncologist: Carmina Miller, M.D. [facility] [place]                             RADIATION ONCOLOGY END OF TREATMENT NOTE     Diagnosis: R91.8 Other nonspecific abnormal finding of lung field Intent: Curative     HPI: Patient is a 78 year old female followed by Dr. Meredeth Ide for progressive right lung mass.  She has multiple medical comorbidities including hypertension diabetes history of breast cancer COPD.  She is was noted to have a right lower lobe lobulated opacity which may represent a pulmonary mass.  PET CT scan demonstrated intensely hypermetabolic bilobed mass in the right lower lobe consistent with primary bronchogenic carcinoma there is also hypermetabolic right hilar and mediastinal nodes consistent with nodal disease and stage IIIb non-small cell lung cancer.  No other evidence of metastasis in the abdomen pelvis was noted and no evidence of skeletal metastasis.  Patient claims she is fairly asymptomatic does have an occasional cough no hemoptysis or chest tightness that she appears to have lost weight.  Patient is adamant about having a bronchoscopy and biopsy which she is canceled and also specifically declines chemotherapy intervention.  She is referred to radiation oncology for opinion.      ==========DELIVERED PLANS==========  First Treatment Date: 2023-12-21 Last Treatment Date: 2024-02-19   Plan Name: Lung_R Site: Lung, Right Technique: IMRT Mode: Photon Dose Per Fraction: 2 Gy Prescribed Dose (Delivered / Prescribed): 70 Gy / 70 Gy Prescribed Fxs (Delivered / Prescribed): 35 / 35     ==========ON TREATMENT VISIT DATES========== 2024-01-02, 2024-01-09, 2024-01-16, 2024-01-24, 2024-01-30, 2024-02-06, 2024-02-13     ==========UPCOMING VISITS==========       ==========APPENDIX - ON TREATMENT  VISIT NOTES==========   See weekly On Treatment Notes in Epic for details in the Media tab (listed as Progress notes on the On Treatment Visit Dates listed above).

## 2024-03-27 ENCOUNTER — Other Ambulatory Visit: Payer: Self-pay | Admitting: *Deleted

## 2024-03-27 ENCOUNTER — Ambulatory Visit
Admission: RE | Admit: 2024-03-27 | Discharge: 2024-03-27 | Disposition: A | Discharge status: Home / Self Care | Source: Ambulatory Visit | Attending: Radiation Oncology | Admitting: Radiation Oncology

## 2024-03-27 ENCOUNTER — Encounter: Payer: Self-pay | Admitting: Radiation Oncology

## 2024-03-27 VITALS — BP 99/64 | HR 128 | Temp 97.6°F | Resp 16

## 2024-03-27 DIAGNOSIS — R918 Other nonspecific abnormal finding of lung field: Secondary | ICD-10-CM

## 2024-03-27 NOTE — Progress Notes (Signed)
 Radiation Oncology Follow up Note  Name: Michelle Hays   Date:   03/27/2024 MRN:  [id number] DOB: Mar 10, 1946    This 78 y.o. female presents to the clinic today for 1 month follow-up status post radiation therapy for stage IIIb non-small cell lung cancer of the right lung.  REFERRING PROVIDER: Caresse Chant, FNP  HPI: Patient is a 78 year old female now at 1 month having completed radiation therapy alone for stage IIIb non-small cell lung cancer of the right lung patient adamantly refused bronchoscopy as well as.  Systemic chemotherapy.  She is doing poorly she states she has no appetite.  No real cough hemoptysis or chest tightness.  She is having no bone pain.  She is having no dysphagia.  COMPLICATIONS OF TREATMENT: none  FOLLOW UP COMPLIANCE: keeps appointments   PHYSICAL EXAM:  BP 99/64   Pulse (!) 128   Temp 97.6 F (36.4 C) (Tympanic)   Resp 16  Thin cachectic female in NAD.  Well-developed well-nourished patient in NAD. HEENT reveals PERLA, EOMI, discs not visualized.  Oral cavity is clear. No oral mucosal lesions are identified. Neck is clear without evidence of cervical or supraclavicular adenopathy. Lungs are clear to A&P. Cardiac examination is essentially unremarkable with regular rate and rhythm without murmur rub or thrill. Abdomen is benign with no organomegaly or masses noted. Motor sensory and DTR levels are equal and symmetric in the upper and lower extremities. Cranial nerves II through XII are grossly intact. Proprioception is intact. No peripheral adenopathy or edema is identified. No motor or sensory levels are noted. Crude visual fields are within normal range.  RADIOLOGY RESULTS: CT scan of the chest ordered  PLAN: Present time I like her to reestablish care with medical oncology.  I have also ordered a CT scan in the next 2 weeks for evaluation.  At this time she may benefit from immunotherapy depending on her CT scan shows.  She did have a large extensive tumor  of the right lung with large mediastinal involvement.  I have also asked her to increase her nutritional intake.  Patient and son both comprehend my recommendations well.  I would like to take this opportunity to thank you for allowing me to participate in the care of your patient.Glenis Langdon, MD

## 2024-04-09 ENCOUNTER — Emergency Department

## 2024-04-09 ENCOUNTER — Inpatient Hospital Stay

## 2024-04-09 ENCOUNTER — Inpatient Hospital Stay
Admission: EM | Admit: 2024-04-09 | Discharge: 2024-04-12 | DRG: 180 | Disposition: A | Discharge status: Home / Self Care | Attending: Internal Medicine | Admitting: Internal Medicine

## 2024-04-09 ENCOUNTER — Other Ambulatory Visit: Payer: Self-pay

## 2024-04-09 ENCOUNTER — Ambulatory Visit: Admission: RE | Admit: 2024-04-09 | Source: Ambulatory Visit

## 2024-04-09 DIAGNOSIS — Z515 Encounter for palliative care: Secondary | ICD-10-CM

## 2024-04-09 DIAGNOSIS — N179 Acute kidney failure, unspecified: Secondary | ICD-10-CM | POA: Diagnosis present

## 2024-04-09 DIAGNOSIS — R1310 Dysphagia, unspecified: Secondary | ICD-10-CM | POA: Diagnosis present

## 2024-04-09 DIAGNOSIS — E871 Hypo-osmolality and hyponatremia: Secondary | ICD-10-CM | POA: Diagnosis present

## 2024-04-09 DIAGNOSIS — C772 Secondary and unspecified malignant neoplasm of intra-abdominal lymph nodes: Secondary | ICD-10-CM | POA: Diagnosis present

## 2024-04-09 DIAGNOSIS — R627 Adult failure to thrive: Secondary | ICD-10-CM | POA: Diagnosis present

## 2024-04-09 DIAGNOSIS — I10 Essential (primary) hypertension: Secondary | ICD-10-CM | POA: Diagnosis present

## 2024-04-09 DIAGNOSIS — E119 Type 2 diabetes mellitus without complications: Secondary | ICD-10-CM | POA: Diagnosis present

## 2024-04-09 DIAGNOSIS — E785 Hyperlipidemia, unspecified: Secondary | ICD-10-CM | POA: Diagnosis present

## 2024-04-09 DIAGNOSIS — I959 Hypotension, unspecified: Secondary | ICD-10-CM | POA: Diagnosis present

## 2024-04-09 DIAGNOSIS — Z882 Allergy status to sulfonamides status: Secondary | ICD-10-CM

## 2024-04-09 DIAGNOSIS — K589 Irritable bowel syndrome without diarrhea: Secondary | ICD-10-CM | POA: Diagnosis present

## 2024-04-09 DIAGNOSIS — Z801 Family history of malignant neoplasm of trachea, bronchus and lung: Secondary | ICD-10-CM

## 2024-04-09 DIAGNOSIS — F17210 Nicotine dependence, cigarettes, uncomplicated: Secondary | ICD-10-CM | POA: Diagnosis present

## 2024-04-09 DIAGNOSIS — Z833 Family history of diabetes mellitus: Secondary | ICD-10-CM

## 2024-04-09 DIAGNOSIS — K219 Gastro-esophageal reflux disease without esophagitis: Secondary | ICD-10-CM | POA: Diagnosis present

## 2024-04-09 DIAGNOSIS — C3411 Malignant neoplasm of upper lobe, right bronchus or lung: Principal | ICD-10-CM | POA: Diagnosis present

## 2024-04-09 DIAGNOSIS — R0602 Shortness of breath: Secondary | ICD-10-CM

## 2024-04-09 DIAGNOSIS — Z79899 Other long term (current) drug therapy: Secondary | ICD-10-CM

## 2024-04-09 DIAGNOSIS — F32A Depression, unspecified: Secondary | ICD-10-CM | POA: Diagnosis present

## 2024-04-09 DIAGNOSIS — J9601 Acute respiratory failure with hypoxia: Secondary | ICD-10-CM | POA: Diagnosis present

## 2024-04-09 DIAGNOSIS — Z9889 Other specified postprocedural states: Secondary | ICD-10-CM

## 2024-04-09 DIAGNOSIS — J189 Pneumonia, unspecified organism: Principal | ICD-10-CM | POA: Diagnosis present

## 2024-04-09 DIAGNOSIS — C7989 Secondary malignant neoplasm of other specified sites: Secondary | ICD-10-CM | POA: Diagnosis present

## 2024-04-09 DIAGNOSIS — Z1152 Encounter for screening for COVID-19: Secondary | ICD-10-CM | POA: Diagnosis not present

## 2024-04-09 DIAGNOSIS — C3491 Malignant neoplasm of unspecified part of right bronchus or lung: Secondary | ICD-10-CM | POA: Diagnosis not present

## 2024-04-09 DIAGNOSIS — D849 Immunodeficiency, unspecified: Secondary | ICD-10-CM | POA: Diagnosis present

## 2024-04-09 DIAGNOSIS — Z7951 Long term (current) use of inhaled steroids: Secondary | ICD-10-CM

## 2024-04-09 DIAGNOSIS — Z83438 Family history of other disorder of lipoprotein metabolism and other lipidemia: Secondary | ICD-10-CM

## 2024-04-09 DIAGNOSIS — Z66 Do not resuscitate: Secondary | ICD-10-CM | POA: Diagnosis present

## 2024-04-09 DIAGNOSIS — I4719 Other supraventricular tachycardia: Secondary | ICD-10-CM | POA: Diagnosis not present

## 2024-04-09 DIAGNOSIS — J440 Chronic obstructive pulmonary disease with acute lower respiratory infection: Secondary | ICD-10-CM | POA: Diagnosis present

## 2024-04-09 DIAGNOSIS — Z7982 Long term (current) use of aspirin: Secondary | ICD-10-CM

## 2024-04-09 DIAGNOSIS — R531 Weakness: Secondary | ICD-10-CM

## 2024-04-09 DIAGNOSIS — Z9849 Cataract extraction status, unspecified eye: Secondary | ICD-10-CM

## 2024-04-09 DIAGNOSIS — J910 Malignant pleural effusion: Secondary | ICD-10-CM | POA: Diagnosis present

## 2024-04-09 DIAGNOSIS — Z7984 Long term (current) use of oral hypoglycemic drugs: Secondary | ICD-10-CM

## 2024-04-09 DIAGNOSIS — C7971 Secondary malignant neoplasm of right adrenal gland: Secondary | ICD-10-CM | POA: Diagnosis present

## 2024-04-09 DIAGNOSIS — Z888 Allergy status to other drugs, medicaments and biological substances status: Secondary | ICD-10-CM

## 2024-04-09 DIAGNOSIS — Z886 Allergy status to analgesic agent status: Secondary | ICD-10-CM

## 2024-04-09 DIAGNOSIS — E861 Hypovolemia: Secondary | ICD-10-CM | POA: Diagnosis present

## 2024-04-09 DIAGNOSIS — Z803 Family history of malignant neoplasm of breast: Secondary | ICD-10-CM

## 2024-04-09 DIAGNOSIS — Z8542 Personal history of malignant neoplasm of other parts of uterus: Secondary | ICD-10-CM

## 2024-04-09 DIAGNOSIS — Z90710 Acquired absence of both cervix and uterus: Secondary | ICD-10-CM

## 2024-04-09 DIAGNOSIS — Z8673 Personal history of transient ischemic attack (TIA), and cerebral infarction without residual deficits: Secondary | ICD-10-CM

## 2024-04-09 DIAGNOSIS — Z808 Family history of malignant neoplasm of other organs or systems: Secondary | ICD-10-CM

## 2024-04-09 DIAGNOSIS — Z923 Personal history of irradiation: Secondary | ICD-10-CM

## 2024-04-09 DIAGNOSIS — Z853 Personal history of malignant neoplasm of breast: Secondary | ICD-10-CM

## 2024-04-09 DIAGNOSIS — F419 Anxiety disorder, unspecified: Secondary | ICD-10-CM | POA: Diagnosis present

## 2024-04-09 DIAGNOSIS — Z8249 Family history of ischemic heart disease and other diseases of the circulatory system: Secondary | ICD-10-CM

## 2024-04-09 LAB — COMPREHENSIVE METABOLIC PANEL WITH GFR
ALT: 11 U/L (ref 0–44)
AST: 22 U/L (ref 15–41)
Albumin: 2 g/dL — ABNORMAL LOW (ref 3.5–5.0)
Alkaline Phosphatase: 73 U/L (ref 38–126)
Anion gap: 8 (ref 5–15)
BUN: 16 mg/dL (ref 8–23)
CO2: 24 mmol/L (ref 22–32)
Calcium: 9.3 mg/dL (ref 8.9–10.3)
Chloride: 97 mmol/L — ABNORMAL LOW (ref 98–111)
Creatinine, Ser: 1.11 mg/dL — ABNORMAL HIGH (ref 0.44–1.00)
GFR, Estimated: 51 mL/min — ABNORMAL LOW (ref >60)
Glucose, Bld: 99 mg/dL (ref 70–99)
Potassium: 4.3 mmol/L (ref 3.5–5.1)
Sodium: 129 mmol/L — ABNORMAL LOW (ref 135–145)
Total Bilirubin: 0.4 mg/dL (ref 0.0–1.2)
Total Protein: 6.4 g/dL — ABNORMAL LOW (ref 6.5–8.1)

## 2024-04-09 LAB — RESP PANEL BY RT-PCR (RSV, FLU A&B, COVID)  RVPGX2
Influenza A by PCR: NEGATIVE
Influenza B by PCR: NEGATIVE
Resp Syncytial Virus by PCR: NEGATIVE
SARS Coronavirus 2 by RT PCR: NEGATIVE

## 2024-04-09 LAB — MAGNESIUM: Magnesium: 1.8 mg/dL (ref 1.7–2.4)

## 2024-04-09 LAB — CBC
HCT: 40.6 % (ref 36.0–46.0)
Hemoglobin: 13.4 g/dL (ref 12.0–15.0)
MCH: 29.3 pg (ref 26.0–34.0)
MCHC: 33 g/dL (ref 30.0–36.0)
MCV: 88.8 fL (ref 80.0–100.0)
Platelets: 382 10*3/uL (ref 150–400)
RBC: 4.57 MIL/uL (ref 3.87–5.11)
RDW: 14.5 % (ref 11.5–15.5)
WBC: 11.6 10*3/uL — ABNORMAL HIGH (ref 4.0–10.5)
nRBC: 0 % (ref 0.0–0.2)

## 2024-04-09 LAB — HEMOGLOBIN A1C
Hgb A1c MFr Bld: 5.4 % (ref 4.8–5.6)
Mean Plasma Glucose: 108.28 mg/dL

## 2024-04-09 MED ORDER — PIPERACILLIN-TAZOBACTAM 3.375 G IVPB
3.3750 g | Freq: Three times a day (TID) | INTRAVENOUS | Status: DC
  Administered 2024-04-09 – 2024-04-12 (×8): 3.375 g via INTRAVENOUS
  Filled 2024-04-09 (×8): qty 50

## 2024-04-09 MED ORDER — LORAZEPAM 1 MG PO TABS
1.0000 mg | ORAL_TABLET | Freq: Every day | ORAL | Status: DC
  Administered 2024-04-09 – 2024-04-11 (×2): 1 mg via ORAL
  Filled 2024-04-09 (×3): qty 1

## 2024-04-09 MED ORDER — SODIUM CHLORIDE 0.9 % IV SOLN
2.0000 g | INTRAVENOUS | Status: DC
  Administered 2024-04-09: 2 g via INTRAVENOUS
  Filled 2024-04-09: qty 20

## 2024-04-09 MED ORDER — APOAEQUORIN 10 MG PO CAPS: 1.0000 | ORAL_CAPSULE | Freq: Every day | ORAL | Status: DC

## 2024-04-09 MED ORDER — IPRATROPIUM-ALBUTEROL 0.5-2.5 (3) MG/3ML IN SOLN
3.0000 mL | Freq: Four times a day (QID) | RESPIRATORY_TRACT | Status: DC
  Administered 2024-04-09 – 2024-04-10 (×2): 3 mL via RESPIRATORY_TRACT
  Filled 2024-04-09 (×2): qty 3

## 2024-04-09 MED ORDER — ONDANSETRON HCL 4 MG PO TABS: 4.0000 mg | ORAL_TABLET | Freq: Four times a day (QID) | ORAL | Status: DC | PRN

## 2024-04-09 MED ORDER — ACETAMINOPHEN 325 MG PO TABS: 650.0000 mg | ORAL_TABLET | Freq: Four times a day (QID) | ORAL | Status: DC | PRN

## 2024-04-09 MED ORDER — ENOXAPARIN SODIUM 40 MG/0.4ML IJ SOSY
40.0000 mg | PREFILLED_SYRINGE | INTRAMUSCULAR | Status: DC
  Administered 2024-04-09 – 2024-04-11 (×2): 40 mg via SUBCUTANEOUS
  Filled 2024-04-09 (×2): qty 0.4

## 2024-04-09 MED ORDER — ARIPIPRAZOLE 5 MG PO TABS
5.0000 mg | ORAL_TABLET | Freq: Every day | ORAL | Status: DC
  Administered 2024-04-09 – 2024-04-12 (×4): 5 mg via ORAL
  Filled 2024-04-09 (×4): qty 1

## 2024-04-09 MED ORDER — GUAIFENESIN ER 600 MG PO TB12
600.0000 mg | ORAL_TABLET | Freq: Two times a day (BID) | ORAL | Status: DC
  Administered 2024-04-09 – 2024-04-11 (×4): 600 mg via ORAL
  Filled 2024-04-09 (×6): qty 1

## 2024-04-09 MED ORDER — ONDANSETRON HCL 4 MG/2ML IJ SOLN: 4.0000 mg | Freq: Four times a day (QID) | INTRAMUSCULAR | Status: DC | PRN

## 2024-04-09 MED ORDER — ASPIRIN 81 MG PO TBEC
81.0000 mg | DELAYED_RELEASE_TABLET | Freq: Every day | ORAL | Status: DC
  Administered 2024-04-09 – 2024-04-11 (×3): 81 mg via ORAL
  Filled 2024-04-09 (×4): qty 1

## 2024-04-09 MED ORDER — HYDROCOD POLI-CHLORPHE POLI ER 10-8 MG/5ML PO SUER
5.0000 mL | Freq: Two times a day (BID) | ORAL | Status: DC | PRN
  Administered 2024-04-10 – 2024-04-11 (×2): 5 mL via ORAL
  Filled 2024-04-09 (×2): qty 5

## 2024-04-09 MED ORDER — IOHEXOL 350 MG/ML SOLN
75.0000 mL | Freq: Once | INTRAVENOUS | Status: AC | PRN
  Administered 2024-04-09: 75 mL via INTRAVENOUS

## 2024-04-09 MED ORDER — SODIUM CHLORIDE 0.9 % IV SOLN: INTRAVENOUS | Status: AC

## 2024-04-09 MED ORDER — ACETAMINOPHEN 650 MG RE SUPP: 650.0000 mg | Freq: Four times a day (QID) | RECTAL | Status: DC | PRN

## 2024-04-09 MED ORDER — OLANZAPINE 5 MG PO TABS: 2.5000 mg | ORAL_TABLET | Freq: Every day | ORAL | Status: DC

## 2024-04-09 MED ORDER — BUDESONIDE 0.25 MG/2ML IN SUSP
0.2500 mg | Freq: Two times a day (BID) | RESPIRATORY_TRACT | Status: DC
  Administered 2024-04-09 – 2024-04-12 (×6): 0.25 mg via RESPIRATORY_TRACT
  Filled 2024-04-09 (×6): qty 2

## 2024-04-09 MED ORDER — METHYLPREDNISOLONE SODIUM SUCC 40 MG IJ SOLR
40.0000 mg | Freq: Two times a day (BID) | INTRAMUSCULAR | Status: DC
  Administered 2024-04-09 – 2024-04-11 (×4): 40 mg via INTRAVENOUS
  Filled 2024-04-09 (×4): qty 1

## 2024-04-09 MED ORDER — SODIUM CHLORIDE 0.9 % IV BOLUS
500.0000 mL | Freq: Once | INTRAVENOUS | Status: AC
  Administered 2024-04-09: 500 mL via INTRAVENOUS

## 2024-04-09 MED ORDER — OMEPRAZOLE 20 MG PO TBDD
20.0000 mg | DELAYED_RELEASE_TABLET | Freq: Every day | ORAL | Status: DC
  Administered 2024-04-10 – 2024-04-12 (×3): 20 mg via ORAL
  Filled 2024-04-09 (×3): qty 1

## 2024-04-09 MED ORDER — SODIUM CHLORIDE 0.9 % IV BOLUS
1000.0000 mL | Freq: Once | INTRAVENOUS | Status: AC
  Administered 2024-04-09: 1000 mL via INTRAVENOUS

## 2024-04-09 MED ORDER — ATORVASTATIN CALCIUM 20 MG PO TABS
40.0000 mg | ORAL_TABLET | Freq: Every day | ORAL | Status: DC
  Administered 2024-04-09 – 2024-04-12 (×4): 40 mg via ORAL
  Filled 2024-04-09 (×4): qty 2

## 2024-04-09 MED ORDER — SODIUM CHLORIDE 0.9 % IV SOLN
500.0000 mg | Freq: Once | INTRAVENOUS | Status: AC
  Administered 2024-04-09: 500 mg via INTRAVENOUS
  Filled 2024-04-09: qty 5

## 2024-04-09 NOTE — ED Notes (Signed)
 Called CCMD for central monitoring

## 2024-04-09 NOTE — ED Provider Notes (Signed)
 [facility] Provider Note    Event Date/Time   First MD Initiated Contact with Patient 04/09/24 1005     (approximate)   History   Fatigue  Pt sts that her last tx of radiation was on April 30th. Pt sts that since then she has been very fatigued as well as not wanting to eat or swallow. Pt sts that she is being treated for Lung Ca and that she also has a lung infection that she is on abx for aswell.    HPI Michelle Hays is a 78 y.o. female PMH non-small cell lung cancer right lung on chemotherapy, hyperlipidemia, asthma/COPD, IBS, hypertension, DM2 presents for evaluation of fatigue, decreased appetite - Brought in by family who provides collateral.  Patient and family state that she has been increasingly weak over the past week or so.  Did have a fall about 1.5 weeks ago with head strike, no LOC, not on blood thinners.  Separately patient was diagnosed with a pneumonia per family report last Thursday, started on Augmentin .  Has been having diminishing p.o. intake and sensation that food gets stuck in her chest, notes she has had prior esophageal issues.  Newly unable to ambulate without heavy assistance.  I reviewed patient's radiation oncology note from 03/27/2024.  Had radiation therapy about 1 month prior.  Patient declined bronchoscopy.  Plan for systemic chemotherapy.  Plan for repeat CT in about 2 weeks.     Physical Exam   Triage Vital Signs: ED Triage Vitals  Encounter Vitals Group     BP 04/09/24 0955 (!) 110/45     Systolic BP Percentile --      Diastolic BP Percentile --      Pulse Rate 04/09/24 0955 (!) 118     Resp 04/09/24 0955 17     Temp 04/09/24 0955 97.9 F (36.6 C)     Temp Source 04/09/24 0955 Oral     SpO2 04/09/24 0955 95 %     Weight 04/09/24 0957 142 lb (64.4 kg)     Height --      Head Circumference --      Peak Flow --      Pain Score 04/09/24 0957 0     Pain Loc --      Pain Education --      Exclude from Growth Chart  --     Most recent vital signs: Vitals:   04/09/24 1430 04/09/24 1530  BP: (!) 98/56 (!) 106/59  Pulse: (!) 105 (!) 116  Resp: (!) 26 (!) 21  Temp:    SpO2: 94% 94%     General: Awake, no distress.  CV:  Good peripheral perfusion.  Tachycardic, regular rhythm, RP 2+ Oropharynx / neck: Oropharynx unremarkable.  No neck masses appreciated.  No stridor. Resp:  Tachypneic on my eval though no severe respiratory distress.  Lung sounds diminished at right base.  No significant wheezing appreciated.  Good airflow otherwise. Abd:  No distention. Nontender to deep palpation throughout Neuro:  Aox4, CN II-XII intact, FNF wnl, finger taps fast b/l, 5/5 strength in bilateral finger extension/grip, EHL/FHL. BUE AG 10+ sec no drift, BLE AG 5+ sec no drift. SILT.  Unable to stand without heavy assistance.     ED Results / Procedures / Treatments   Labs (all labs ordered are listed, but only abnormal results are displayed) Labs Reviewed  CBC - Abnormal; Notable for the following components:      Result Value  WBC 11.6 (*)    All other components within normal limits  COMPREHENSIVE METABOLIC PANEL WITH GFR - Abnormal; Notable for the following components:   Sodium 129 (*)    Chloride 97 (*)    Creatinine, Ser 1.11 (*)    Total Protein 6.4 (*)    Albumin 2.0 (*)    GFR, Estimated 51 (*)    All other components within normal limits  RESP PANEL BY RT-PCR (RSV, FLU A&B, COVID)  [id number]  CULTURE, BLOOD (ROUTINE X 2)  CULTURE, BLOOD (ROUTINE X 2)  MAGNESIUM  URINALYSIS, COMPLETE (UACMP) WITH MICROSCOPIC     EKG  See ED course below.   RADIOLOGY Radiology interpreted by myself and radiology report reviewed.  See ED course below.   PROCEDURES:  Critical Care performed: No  Procedures   MEDICATIONS ORDERED IN ED: Medications  cefTRIAXone (ROCEPHIN) 2 g in sodium chloride  0.9 % 100 mL IVPB (2 g Intravenous New Bag/Given 04/09/24 1554)  azithromycin  (ZITHROMAX ) 500 mg in sodium  chloride 0.9 % 250 mL IVPB (has no administration in time range)  sodium chloride  0.9 % bolus 1,000 mL (0 mLs Intravenous Stopped 04/09/24 1551)  iohexol  (OMNIPAQUE ) 350 MG/ML injection 75 mL (75 mLs Intravenous Contrast Given 04/09/24 1111)  sodium chloride  0.9 % bolus 500 mL (500 mLs Intravenous New Bag/Given 04/09/24 1553)     IMPRESSION / MDM / ASSESSMENT AND PLAN / ED COURSE  I reviewed the triage vital signs and the nursing notes.                              DDX/MDM/AP: Differential diagnosis includes, but is not limited to, dehydration in the setting of decreased p.o. intake with known underlying malignancy.  Consider worsening of underlying pneumonia, UTI.  Consider pulmonary embolism given tachypnea, tachycardia, known underlying lung cancer.  Consider worsening of known underlying malignancy.  Consider worsening of reported esophageal stricture, consider radiation side effect treating the presentation.  Suspect weakness and gait instability likely due to other organic etiology though consider intracranial pathology especially with fall 1.5 weeks ago--will screen with CT head.  Was planned for outpatient CT of chest today, will get CT PE to assess malignancy, PE, pneumonia.  Plan: - labs - EKG - CTA chest, CT head - IV fluid - Reassess  Patient's presentation is most consistent with acute presentation with potential threat to life or bodily function.  The patient is on the cardiac monitor to evaluate for evidence of arrhythmia and/or significant heart rate changes.  ED course below.  Workup notable for mildly worsened hyponatremia as well as concern for developing pneumonia.  Also with incidental finding of pituitary gland enlargement concerning for possible mass.  Patient had recently been treated with Augmentin  and completed course about 3 days ago yet has worsening symptomatology.  Somewhat tachypneic here though fortunately oxygen requirement.  Some ongoing mild tachycardia as  well as new leukocytosis genuine underlying infection.  Treated with ceftriaxone and azithromycin  pending admission to hospital.  Clinical Course as of 04/09/24 1607  Tue Apr 09, 2024  1027 BMP with moderate hyponatremia and AKI, suspect hypovolemia  IV fluid already ordered [MM]  1121 Ecg = sinus tachycardia, rate 113, no ST elevation or depression, no significant repolarization abnormality, normal axis, normal intervals.  No evidence of ischemia nor arrhythmia on my read interpretation. [MM]  1156 Viral swab negative [MM]  1213 CTH: IMPRESSION: 1. No acute intracranial hemorrhage or evidence  of an acute infarct. 2. Mild prominence of the pituitary gland, new from the prior MRI of 10/15/2010 and concerning for a possible underlying pituitary mass. A non-emergent pituitary protocol brain MRI (with and without contrast) is recommended for further evaluation. 3. Mild chronic small vessel ischemic changes within the cerebral white matter. 4. Generalized cerebral atrophy. 5. Mild left frontal sinus mucosal thickening.   [MM]  1248 CXR: IMPRESSION: Small right pleural effusion and right lung base atelectasis or infiltrate.   [MM]  1355 CTA chest: IMPRESSION: 1. No CT evidence of pulmonary artery embolus. 2. Right infrahilar/right lower lobe mass in keeping with known malignancy. 3. Moderate right pleural effusion. 4. Consolidative changes of the majority of the right lower lobe may represent atelectasis or pneumonia versus infiltrative mass. 5. Diffuse peribronchial thickening with mucous impaction in the right middle and left lower lobe bronchi. High-grade narrowing of the bronchus intermedius may be related to mucous impaction or tumor infiltration. 6. Bilateral adrenal nodules suspicious for metastasis. 7. Hepatic metastasis. 8. Retroperitoneal adenopathy. 9. Aortic Atherosclerosis (ICD10-I70.0) and Emphysema (ICD10-J43.9).   [MM]  1500 Findings discussed with patient and  family, given ongoing evidence of pneumonia despite recent Augmentin , new leukocytosis tachypnea and generalized weakness preventing patient from walking, did recommend admission to inpatient service, family and patient amenable.  Hospitalist consult placed. [MM]    Clinical Course User Index [MM] Collis Deaner, MD     FINAL CLINICAL IMPRESSION(S) / ED DIAGNOSES   Final diagnoses:  Pneumonia due to infectious organism, unspecified laterality, unspecified part of lung  Shortness of breath  Weakness     Rx / DC Orders   ED Discharge Orders     None        Note:  This document was prepared using Dragon voice recognition software and may include unintentional dictation errors.   Collis Deaner, MD 04/09/24 [phone]

## 2024-04-09 NOTE — ED Notes (Signed)
 Lab at bedside for cultures.

## 2024-04-09 NOTE — ED Notes (Signed)
 Pt gone to CT

## 2024-04-09 NOTE — ED Triage Notes (Signed)
 Pt sts that her last tx of radiation was on April 30th. Pt sts that since then she has been very fatigued as well as not wanting to eat or swallow. Pt sts that she is being treated for Lung Ca and that she also has a lung infection that she is on abx for aswell.

## 2024-04-09 NOTE — H&P (Signed)
 History and Physical    Patient: Michelle Hays GUR:[id number] DOB: [birth year] DOA: 04/09/2024 DOS: the patient was seen and examined on 04/09/2024 PCP: Caresse Chant, FNP  Patient coming from: Home  Chief Complaint:  Chief Complaint  Patient presents with   Fatigue   HPI: Michelle Hays is a 78 y.o. female with medical history significant of lung cancer right lung for which she never underwent bronchoscopy for definitive diagnosis, hyperlipidemia, asthma/COPD, IBS, hypertension, DM2 presents with family for evaluation of fatigue, progressive weakness, odynophagia/dysphagia and  inability to swallow solids and liquids for the past two weeks. Sometimes regurgitates solid food. Also reports chest pain and cough which started in the ED. No LE swelling, palpitations, fevers or sick contacts.    Per chart review, she was first diagnosed with lung malignancy by PET scan in October 2024, obtained as follow up for incidental hilar mass found on CT. She chose to defer bronchoscopy for definitive diagnosis as she was not interested in chemotherapy, and elected to proceed with empiric radiation which she completed at the end of March.  Per her last Rad Onc follow up on 5/7, the plan moving forward was for her to re-establish with medical oncology for possible immunotherapy pending a repeat a CT scan, which has not yet been scheduled.  Unfortunately she has had progressive weakness and increased difficulty swallowing for the past few weeks. She was diagnosed with pneumonia and started on Augmentin  by her PC without improvement.   Today, CTA done today revealed likely progression to stage 4 disease with suspected hepatic and adrenal metastases, as well as extensive RRL tumor with paratracheal, supraclavicular, and axilary adenopathy, moderate right pleural effusion, diffuse peribronchial thickening with mucous impaction, possible tumor infiltration to the bronchus intermedius.     Otherwise she was in sinus  tachycardia, hypotensive but fluid responsive, tacypneic but saturating appropriately on room air. WBC 11.6. NA 129.   CT head with new prominence of the pituitary gland and other chronic changes.  I explained that we can treat the potential pneumonia, however its not likely to significantly improve her weakness or dysphagia. Her primary issue is the extensive, progressive malignancy. They do not want feeding tube.  Recommended Palliative  consult but  family requesting Pulmonology consult here. She is still requesting she be full code. Overall they seem to have poor insight into her prognosis and will need ongoing goals of care discussion.    Review of Systems: Review of Systems  Constitutional:  Positive for malaise/fatigue and weight loss. Negative for chills, diaphoresis and fever.  Eyes:  Negative for blurred vision.  Respiratory:  Positive for cough, shortness of breath and wheezing. Negative for hemoptysis and sputum production.   Cardiovascular:  Positive for chest pain. Negative for palpitations, orthopnea, claudication and leg swelling.  Gastrointestinal:  Positive for diarrhea (chronic) and vomiting. Negative for abdominal pain, blood in stool, constipation and nausea.  Genitourinary:  Negative for dysuria, frequency and urgency.  Musculoskeletal:  Negative for back pain.  Skin:  Negative for rash.  Neurological:  Positive for dizziness and headaches. Negative for tremors, sensory change, speech change and focal weakness.  Psychiatric/Behavioral:  Negative for hallucinations and substance abuse.     Past Medical History:  Diagnosis Date   Anxiety    Arthritis    Asthma    Atypical squamous cell changes of undetermined significance (ASCUS) on cervical cytology with positive high risk human papilloma virus (HPV) 03/16/2015   BP (high blood pressure) 06/27/2014  Breast cancer (HCC)    Breast cancer, left breast (HCC) 1995   Left Breast    CAFL (chronic airflow limitation) (HCC)     COPD (chronic obstructive pulmonary disease) (HCC)    DDD (degenerative disc disease), lumbar    Depression    Diabetes mellitus without complication (HCC)    Patient takes Januvia   Endometrial cancer determined by uterine biopsy (HCC) 2016   GERD (gastroesophageal reflux disease)    Heart murmur    Hyperlipidemia    Hypertension    IBS (irritable bowel syndrome)    Migraine    Osteopenia    Stroke [facility])    Past Surgical History:  Procedure Laterality Date   ABDOMINAL HYSTERECTOMY     BREAST LUMPECTOMY  2012   [place]-bilateral breast lumpectomy   CATARACT EXTRACTION  2013   CHOLECYSTECTOMY     CHOLECYSTECTOMY, LAPAROSCOPIC     COLONOSCOPY WITH PROPOFOL  N/A 07/06/2016   Procedure: COLONOSCOPY WITH PROPOFOL ;  Surgeon: Deveron Fly, MD;  Location: [facility] ENDOSCOPY;  Service: Endoscopy;  Laterality: N/A;   ESOPHAGOGASTRODUODENOSCOPY  03/02/2008, 10/12/2010, 03/28/2013   ESOPHAGOGASTRODUODENOSCOPY (EGD) WITH PROPOFOL  N/A 07/06/2016   Procedure: ESOPHAGOGASTRODUODENOSCOPY (EGD) WITH PROPOFOL ;  Surgeon: Deveron Fly, MD;  Location: [facility] ENDOSCOPY;  Service: Endoscopy;  Laterality: N/A;   EYE SURGERY     LAPAROSCOPIC BILATERAL SALPINGO OOPHERECTOMY Bilateral 05/20/2015   Procedure: LAPAROSCOPIC BILATERAL SALPINGO OOPHORECTOMY;  Surgeon: Nobie Batch, MD;  Location: [facility] ORS;  Service: Gynecology;  Laterality: Bilateral;   ROBOTIC ASSISTED TOTAL HYSTERECTOMY N/A 05/20/2015   Procedure: ROBOTIC ASSISTED TOTAL HYSTERECTOMY;  Surgeon: Nobie Batch, MD;  Location: [facility] ORS;  Service: Gynecology;  Laterality: N/A;   Social History:  reports that she has quit smoking. Her smoking use included cigarettes. She has a 104 pack-year smoking history. She has never used smokeless tobacco. She reports that she does not drink alcohol and does not use drugs.  Allergies  Allergen Reactions   Sulfa Antibiotics Rash    Family History  Problem Relation Age of Onset   Lung  cancer Mother        BONE, BRAIN METS   Brain cancer Mother    Diabetes Mellitus II Mother    Hypertension Mother        X 2 BROTHERS   Coronary artery disease Father    Hypertension Sister    Hyperlipidemia Brother        X 2 BROTHERS   Hypertension Brother    Breast cancer Maternal Aunt     Prior to Admission medications   Medication Sig Start Date End Date Taking? Authorizing Provider  albuterol  (PROVENTIL  HFA;VENTOLIN  HFA) 108 (90 BASE) MCG/ACT inhaler Inhale 2 puffs into the lungs every 4 (four) hours as needed for wheezing. For wheezing Patient not taking: Reported on 03/27/2024    [provider]  Apoaequorin (PREVAGEN) 10 MG CAPS Take 1 capsule by mouth daily.    [provider]  ARIPiprazole  (ABILIFY ) 5 MG tablet Take 5 mg by mouth daily.    [provider]  aspirin EC 81 MG tablet Take 1 tablet by mouth daily.    [provider]  atorvastatin (LIPITOR) 40 MG tablet Take 40 mg by mouth daily.    [provider]  calcium -vitamin D (OSCAL WITH D) 500-200 MG-UNIT tablet Take 1 tablet by mouth.    [provider]  chlorpheniramine-HYDROcodone  (TUSSIONEX) 10-8 MG/5ML Take 5 mLs by mouth every 12 (twelve) hours as needed for cough. 01/24/24  Borders, Carlene Che, NP  cholecalciferol (VITAMIN D) 400 units TABS tablet Take 1,000 Units by mouth daily.    [provider]  Fluticasone-Umeclidin-Vilant (TRELEGY ELLIPTA) 200-62.5-25 MCG/ACT AEPB Inhale 1 Inhalation into the lungs daily.    [provider]  loperamide (IMODIUM) 2 MG capsule Take by mouth as needed for diarrhea or loose stools.    [provider]  LORazepam (ATIVAN) 1 MG tablet Take 1 mg by mouth at bedtime.    [provider]  magnesium oxide (MAG-OX) 400 MG tablet Take 400 mg by mouth daily.    [provider]  OLANZapine  (ZYPREXA ) 2.5 MG tablet Take 2.5 mg by mouth daily. 06/25/14   [provider]  omeprazole (PRILOSEC) 20  MG capsule Take 40 mg by mouth daily.    [provider]  Probiotic Product (PROBIOTIC DAILY PO) Take 1 capsule by mouth daily.    [provider]  sitaGLIPtin (JANUVIA) 100 MG tablet Take 100 mg by mouth daily.    [provider]  sucralfate (CARAFATE) 1 GM/10ML suspension Take 1 g by mouth 4 (four) times daily -  with meals and at bedtime.    [provider]  tiotropium (SPIRIVA) 18 MCG inhalation capsule Place 18 mcg into inhaler and inhale daily. Patient not taking: Reported on 09/26/2023    [provider]    Physical Exam: Vitals:   04/09/24 1610 04/09/24 1630 04/09/24 1730 04/09/24 1800  BP:  106/60 100/67 (!) 105/56  Pulse: (!) 118 (!) 127 (!) 115 (!) 110  Resp: (!) 23 (!) 22 (!) 25 (!) 28  Temp: 97.8 F (36.6 C)     TempSrc: Oral     SpO2:  95% 96% 95%  Weight:       Physical Exam Vitals and nursing note reviewed.  Constitutional:      General: She is not in acute distress.    Appearance: She is ill-appearing. She is not diaphoretic.  HENT:     Head: Normocephalic and atraumatic.     Mouth/Throat:     Mouth: Mucous membranes are moist.     Pharynx: Oropharynx is clear.  Eyes:     Pupils: Pupils are equal, round, and reactive to light.  Cardiovascular:     Rate and Rhythm: Regular rhythm. Tachycardia present.  Pulmonary:     Effort: Pulmonary effort is normal. No respiratory distress.     Breath sounds: Wheezing and rhonchi present.  Chest:     Chest wall: No tenderness.  Abdominal:     General: There is no distension.     Palpations: Abdomen is soft.     Tenderness: There is no abdominal tenderness.  Neurological:     General: No focal deficit present.     Mental Status: She is alert. Mental status is at baseline.  Psychiatric:        Mood and Affect: Mood normal.        Behavior: Behavior normal.     Data Reviewed:   Labs on Admission: I have personally reviewed following labs and imaging  studies  CBC: Recent Labs  Lab 04/09/24 0959  WBC 11.6*  HGB 13.4  HCT 40.6  MCV 88.8  PLT 382   Basic Metabolic Panel: Recent Labs  Lab 04/09/24 0959  NA 129*  K 4.3  CL 97*  CO2 24  GLUCOSE 99  BUN 16  CREATININE 1.11*  CALCIUM  9.3  MG 1.8   GFR: Estimated Creatinine Clearance: 43.2 mL/min (A) (by  C-G formula based on SCr of 1.11 mg/dL (H)). Liver Function Tests: Recent Labs  Lab 04/09/24 0959  AST 22  ALT 11  ALKPHOS 73  BILITOT 0.4  PROT 6.4*  ALBUMIN 2.0*   No results for input(s): "LIPASE", "AMYLASE" in the last 168 hours. No results for input(s): "AMMONIA" in the last 168 hours. Coagulation Profile: No results for input(s): "INR", "PROTIME" in the last 168 hours. Cardiac Enzymes: No results for input(s): "CKTOTAL", "CKMB", "CKMBINDEX", "TROPONINI" in the last 168 hours. BNP (last 3 results) No results for input(s): "PROBNP" in the last 8760 hours. HbA1C: No results for input(s): "HGBA1C" in the last 72 hours. CBG: No results for input(s): "GLUCAP" in the last 168 hours. Lipid Profile: No results for input(s): "CHOL", "HDL", "LDLCALC", "TRIG", "CHOLHDL", "LDLDIRECT" in the last 72 hours. Thyroid  Function Tests: No results for input(s): "TSH", "T4TOTAL", "FREET4", "T3FREE", "THYROIDAB" in the last 72 hours. Anemia Panel: No results for input(s): "VITAMINB12", "FOLATE", "FERRITIN", "TIBC", "IRON", "RETICCTPCT" in the last 72 hours. Urine analysis: No results found for: "COLORURINE", "APPEARANCEUR", "LABSPEC", "PHURINE", "GLUCOSEU", "HGBUR", "BILIRUBINUR", "KETONESUR", "PROTEINUR", "UROBILINOGEN", "NITRITE", "LEUKOCYTESUR"  Radiological Exams on Admission: CT Angio Chest PE W and/or Wo Contrast Result Date: 04/09/2024 CLINICAL DATA:  Shortness of breath and weakness. Right hilar and lower lobe mass/bronchogenic carcinoma. EXAM: CT ANGIOGRAPHY CHEST WITH CONTRAST TECHNIQUE: Multidetector CT imaging of the chest was performed using the standard protocol  during bolus administration of intravenous contrast. Multiplanar CT image reconstructions and MIPs were obtained to evaluate the vascular anatomy. RADIATION DOSE REDUCTION: This exam was performed according to the departmental dose-optimization program which includes automated exposure control, adjustment of the mA and/or kV according to patient size and/or use of iterative reconstruction technique. CONTRAST:  75mL OMNIPAQUE  IOHEXOL  350 MG/ML SOLN COMPARISON:  Chest CT dated 08/02/2017. FINDINGS: Cardiovascular: There is no cardiomegaly. Advanced 3 vessel coronary vascular calcification. Small pericardial effusion measuring 6 mm thickness. Moderate atherosclerotic calcification of the thoracic aorta. No aneurysmal dilatation or dissection. The origins of the great vessels of the aortic arch appear patent. No pulmonary artery embolus identified. Mediastinum/Nodes: There is a 2.8 x 4.1 cm right infrahilar/right lower lobe mass in keeping with known malignancy. Right paratracheal adenopathy measure 19 mm in short axis. Right supraclavicular adenopathy measure up to 22 mm. Bulky right axillary adenopathy measure 17 mm short axis. There is a small hiatal hernia. The esophagus is grossly unremarkable. No mediastinal fluid collection. Lungs/Pleura: Moderate right pleural effusion. There is consolidative changes of the majority of the right lower lobe which may represent atelectasis or pneumonia versus infiltrative mass. Background of centrilobular emphysema. There is diffuse peribronchial thickening with mucous impaction in the right middle and left lower lobe bronchi. There is high-grade narrowing of the bronchus intermedius which may be related to mucous impaction or tumor infiltration. No pneumothorax. Upper Abdomen: Bilateral adrenal nodules suspicious for metastasis. Faint low attenuating lesions in the liver also concerning for metastasis. There is retroperitoneal adenopathy. Partially visualized 3.5 x 4.0 cm ovoid  mass in the portacaval region, likely adenopathy. Musculoskeletal: Osteopenia with degenerative changes of spine. No acute osseous pathology. Review of the MIP images confirms the above findings. IMPRESSION: 1. No CT evidence of pulmonary artery embolus. 2. Right infrahilar/right lower lobe mass in keeping with known malignancy. 3. Moderate right pleural effusion. 4. Consolidative changes of the majority of the right lower lobe may represent atelectasis or pneumonia versus infiltrative mass. 5. Diffuse peribronchial thickening with mucous impaction in the right middle and left lower  lobe bronchi. High-grade narrowing of the bronchus intermedius may be related to mucous impaction or tumor infiltration. 6. Bilateral adrenal nodules suspicious for metastasis. 7. Hepatic metastasis. 8. Retroperitoneal adenopathy. 9. Aortic Atherosclerosis (ICD10-I70.0) and Emphysema (ICD10-J43.9). Electronically Signed   By: Angus Bark M.D.   On: 04/09/2024 13:33   DG Chest 2 View Result Date: 04/09/2024 CLINICAL DATA:  Fatigue. Shortness of breath. History of lung cancer. EXAM: CHEST - 2 VIEW COMPARISON:  Chest CT dated 05/02/2023. FINDINGS: Small right pleural effusion and right lung base atelectasis or infiltrate, new since the prior radiograph. The known right infrahilar mass is not visualized due to right lung base density. No pneumothorax. Stable cardiac silhouette. Atherosclerotic calcification of the aorta. No acute osseous pathology. IMPRESSION: Small right pleural effusion and right lung base atelectasis or infiltrate. Electronically Signed   By: Angus Bark M.D.   On: 04/09/2024 12:29   CT HEAD WO CONTRAST ( ) Result Date: 04/09/2024 CLINICAL DATA:  Provided history: Weakness, unsteady gait, fall with head strike approximately 1.5 weeks ago. EXAM: CT HEAD WITHOUT CONTRAST TECHNIQUE: Contiguous axial images were obtained from the base of the skull through the vertex without intravenous contrast. RADIATION  DOSE REDUCTION: This exam was performed according to the departmental dose-optimization program which includes automated exposure control, adjustment of the mA and/or kV according to patient size and/or use of iterative reconstruction technique. COMPARISON:  Brain MRI 10/15/2010. FINDINGS: Brain: Generalized cerebral atrophy. Mild chronic small vessel ischemic changes within the cerebral white matter, occult by CT and better appreciated on the prior brain MRI of 10/15/2010. Mild prominence of the pituitary gland (with slight bulging into the suprasellar cistern), new from the prior MRI. There is no acute intracranial hemorrhage. No demarcated cortical infarct. No extra-axial fluid collection. No midline shift. Vascular: No hyperdense vessel.  Atherosclerotic calcifications. Skull: No calvarial fracture or aggressive osseous lesion. Sinuses/Orbits: No mass or acute finding within the imaged orbits. Mild mucosal thickening within the left frontal sinus. IMPRESSION: 1. No acute intracranial hemorrhage or evidence of an acute infarct. 2. Mild prominence of the pituitary gland, new from the prior MRI of 10/15/2010 and concerning for a possible underlying pituitary mass. A non-emergent pituitary protocol brain MRI (with and without contrast) is recommended for further evaluation. 3. Mild chronic small vessel ischemic changes within the cerebral white matter. 4. Generalized cerebral atrophy. 5. Mild left frontal sinus mucosal thickening. Electronically Signed   By: Bascom Lily D.O.   On: 04/09/2024 12:08       Assessment and Plan: No notes have been filed under this hospital service. Service: Hospitalist  78 y.o. female with medical history significant of lung cancer right lung for which she never underwent bronchoscopy for definitive diagnosis s/p empiric radiation, asthma/COPD, IBS, hypertension, DM2 presents with family for evaluation of failure to thrive in the setting of progressive underlying malignancy.      lung cancer of the right lung with evidence of progression to stage 4 disease, deferred definitive  diagnosis  ?post obstructive pneumonia  COPD  - Will treat for potential superimposed pneumonia, however suspect etiology of patient's symptoms overall progression and subsequent complications of this progressive malignancy. Completed empiric radiation in April, pending f/u with medical oncology for possible immunotherapy  - Pulmonary consult to discuss palliative procedures such as thoracentesis, now apparently open to bronchoscopy  - Palliative care consult for ongoing goals of care discussions  - pulmicort, duonebs  - respiratory culture and viral PCR   - pulmonary toilet  -  Zosyn - incentive spirometer - solumedrol q12   Failure to thrive in the setting of above,  Dysphagia/odynophagia  Hypovolemic hyponatremia   - suspect radiation esophagitis vs stricture vs aspiration. Esophagus unremarkable on CTA today  - protonix   - NPO pending SLP evaluation  - NS - Consider GI consult   T2DM  - Hold home medications for now and monitor glucose for now given inadequate PO intake and glucose wnl.  - check A1c   Pituitary abnormality  -f/u MRI ordered   Lovenox  NS@100  Monitor/replace electrolytes   Advance Care Planning:   Code Status: Full Code discussed with patient and family present at bedside    Severity of Illness: The appropriate patient status for this patient is INPATIENT. Inpatient status is judged to be reasonable and necessary in order to provide the required intensity of service to ensure the patient's safety. The patient's presenting symptoms, physical exam findings, and initial radiographic and laboratory data in the context of their chronic comorbidities is felt to place them at high risk for further clinical deterioration. Furthermore, it is not anticipated that the patient will be medically stable for discharge from the hospital within 2 midnights of admission.    * I certify that at the point of admission it is my clinical judgment that the patient will require inpatient hospital care spanning beyond 2 midnights from the point of admission due to high intensity of service, high risk for further deterioration and high frequency of surveillance required.*  Author: Charlesetta Connors, DO 04/09/2024 6:49 PM  For on call review [email or web address].

## 2024-04-09 NOTE — Progress Notes (Signed)
PHARMACIST - PHYSICIAN ORDER COMMUNICATION  CONCERNING: P&T Medication Policy on Herbal Medications  DESCRIPTION:  This patient's order for:  apoaequorin  has been noted.  This product(s) is classified as an "herbal" or natural product. Due to a lack of definitive safety studies or FDA approval, nonstandard manufacturing practices, plus the potential risk of unknown drug-drug interactions while on inpatient medications, the Pharmacy and Therapeutics Committee does not permit the use of "herbal" or natural products of this type within [place].   ACTION TAKEN: The pharmacy department is unable to verify this order at this time and your patient has been informed of this safety policy. Please reevaluate patient's clinical condition at discharge and address if the herbal or natural product(s) should be resumed at that time.  

## 2024-04-09 NOTE — ED Notes (Signed)
Lab coming to draw blood cultures.

## 2024-04-10 ENCOUNTER — Inpatient Hospital Stay

## 2024-04-10 DIAGNOSIS — J189 Pneumonia, unspecified organism: Secondary | ICD-10-CM

## 2024-04-10 DIAGNOSIS — C3411 Malignant neoplasm of upper lobe, right bronchus or lung: Secondary | ICD-10-CM | POA: Diagnosis not present

## 2024-04-10 LAB — BASIC METABOLIC PANEL WITH GFR
Anion gap: 10 (ref 5–15)
BUN: 15 mg/dL (ref 8–23)
CO2: 21 mmol/L — ABNORMAL LOW (ref 22–32)
Calcium: 8.4 mg/dL — ABNORMAL LOW (ref 8.9–10.3)
Chloride: 103 mmol/L (ref 98–111)
Creatinine, Ser: 1.04 mg/dL — ABNORMAL HIGH (ref 0.44–1.00)
GFR, Estimated: 55 mL/min — ABNORMAL LOW (ref >60)
Glucose, Bld: 101 mg/dL — ABNORMAL HIGH (ref 70–99)
Potassium: 4.1 mmol/L (ref 3.5–5.1)
Sodium: 134 mmol/L — ABNORMAL LOW (ref 135–145)

## 2024-04-10 LAB — CBC
HCT: 35.1 % — ABNORMAL LOW (ref 36.0–46.0)
Hemoglobin: 11.3 g/dL — ABNORMAL LOW (ref 12.0–15.0)
MCH: 28.3 pg (ref 26.0–34.0)
MCHC: 32.2 g/dL (ref 30.0–36.0)
MCV: 88 fL (ref 80.0–100.0)
Platelets: 335 10*3/uL (ref 150–400)
RBC: 3.99 MIL/uL (ref 3.87–5.11)
RDW: 14.4 % (ref 11.5–15.5)
WBC: 7.8 10*3/uL (ref 4.0–10.5)
nRBC: 0 % (ref 0.0–0.2)

## 2024-04-10 LAB — BODY FLUID CELL COUNT WITH DIFFERENTIAL
Eos, Fluid: 0 %
Lymphs, Fluid: 96 %
Monocyte-Macrophage-Serous Fluid: 4 % — ABNORMAL LOW (ref 50–90)
Neutrophil Count, Fluid: 0 % (ref 0–25)
Total Nucleated Cell Count, Fluid: 2868 uL — ABNORMAL HIGH (ref 0–1000)

## 2024-04-10 LAB — PROTEIN, PLEURAL OR PERITONEAL FLUID: Total protein, fluid: <3 g/dL

## 2024-04-10 LAB — GLUCOSE, PLEURAL OR PERITONEAL FLUID: Glucose, Fluid: 111 mg/dL

## 2024-04-10 LAB — LACTATE DEHYDROGENASE, PLEURAL OR PERITONEAL FLUID: LD, Fluid: 154 U/L — ABNORMAL HIGH (ref 3–23)

## 2024-04-10 MED ORDER — IPRATROPIUM-ALBUTEROL 0.5-2.5 (3) MG/3ML IN SOLN
3.0000 mL | Freq: Two times a day (BID) | RESPIRATORY_TRACT | Status: DC
  Administered 2024-04-10 – 2024-04-12 (×4): 3 mL via RESPIRATORY_TRACT
  Filled 2024-04-10 (×4): qty 3

## 2024-04-10 MED ORDER — GADOBUTROL 1 MMOL/ML IV SOLN
6.0000 mL | Freq: Once | INTRAVENOUS | Status: AC | PRN
  Administered 2024-04-10: 6 mL via INTRAVENOUS

## 2024-04-10 MED ORDER — LIDOCAINE HCL (PF) 1 % IJ SOLN
10.0000 mL | Freq: Once | INTRAMUSCULAR | Status: AC
  Administered 2024-04-10: 10 mL via INTRADERMAL
  Filled 2024-04-10: qty 10

## 2024-04-10 NOTE — Progress Notes (Addendum)
 Progress Note    Michelle Hays  ZOX:[id number] DOB: 1946-05-29  DOA: 04/09/2024 PCP: Caresse Chant, FNP      Brief Narrative:    Medical records reviewed and are as summarized below:  Michelle Hays is a 78 y.o. female  with medical history significant for right lung cancer for which she never underwent bronchoscopy for definitive diagnosis, hyperlipidemia, asthma/COPD, IBS, hypertension, type II DM, who presented to the hospital because of fatigue, general weakness, pain and difficulty swallowing for 2 weeks.  She has lost over 10 pounds of weight over the past few weeks.  She also complained of cough and chest pain which started in the ED. She was recently diagnosed with pneumonia and was started on Augmentin  by her PCP without improvement.  Per chart review, she was first diagnosed with lung malignancy by PET scan in October 2024, obtained as follow up for incidental hilar mass found on CT. She chose to defer bronchoscopy for definitive diagnosis as she was not interested in chemotherapy, and elected to proceed with empiric radiation which she completed at the end of March.  Per her last Rad Onc follow up on 5/7, the plan moving forward was for her to re-establish with medical oncology for possible immunotherapy pending a repeat a CT scan, which has not yet been scheduled.  Unfortunately she has had progressive weakness and increased difficulty swallowing for the past few weeks. She was diagnosed with pneumonia and started on Augmentin  by her PC without improvement.   On this admission, she was diagnosed with community-acquired pneumonia.  Imaging revealed right lung cancer with possible metastasis to the liver, adrenal glands, pituitary gland, mediastinal lymph nodes, right supraclavicular, right axillary and retroperitoneal lymph nodes   Assessment/Plan:   Principal Problem:   Pneumonia   Body mass index is 21.61 kg/m.   Community-acquired pneumonia in an  immunocompromised patient: Continue IV Zosyn.  Follow-up blood cultures.   Right lung cancer with imaging concerning for metastasis to the liver, adrenal glands, pituitary gland, right supraclavicular, right axillary, mediastinal and retroperitoneal lymph nodes.  Consulted Dr. Valentine Gasmen, oncologist, to assist with management. Recently completed radiation therapy on 02/19/2024   Right pleural effusion: Ordered right-sided thoracentesis for diagnostics including cytology and therapeutic benefit.   Sinus tachycardia with PACs: Likely due to acute illness.  Monitor on telemetry.   Acute hypoxic respiratory failure: Continue 2 L/min oxygen via nasal cannula.  Wean off oxygen as able.   Dysphagia, odynophagia: Speech therapist recommended full liquid diet   Hyponatremia: Sodium up from 129-134.  Monitor BMP.   General Weakness,, failure to thrive: Likely from underlying malignancy.  PT and OT as able   Comorbidities include COPD, anxiety, type II DM, hypertension, history of stroke, depression, anxiety   Discussed CODE STATUS with the patient.  She wants to be DNR.  This was confirmed by her son and daughter-in-law at the bedside.   Diet Order             Diet full liquid Room service appropriate? Yes; Fluid consistency: Thin  Diet effective now                            Consultants: Oncologist  Procedures: None    Medications:    ARIPiprazole   5 mg Oral Daily   aspirin EC  81 mg Oral Daily   atorvastatin  40 mg Oral Daily   budesonide (PULMICORT) nebulizer solution  0.25 mg Nebulization BID   enoxaparin (LOVENOX) injection  40 mg Subcutaneous Q24H   guaiFENesin  600 mg Oral BID   ipratropium-albuterol   3 mL Nebulization BID   LORazepam  1 mg Oral QHS   methylPREDNISolone (SOLU-MEDROL) injection  40 mg Intravenous Q12H   omeprazole  20 mg Oral Daily   Continuous Infusions:  sodium chloride  100 mL/hr at 04/09/24 2152   piperacillin-tazobactam  (ZOSYN)  IV 3.375 g (04/10/24 0609)     Anti-infectives (From admission, onward)    Start     Dose/Rate Route Frequency Ordered Stop   04/09/24 2200  piperacillin-tazobactam (ZOSYN) IVPB 3.375 g        3.375 g 12.5 mL/hr over 240 Minutes Intravenous Every 8 hours 04/09/24 1900     04/09/24 1515  cefTRIAXone (ROCEPHIN) 2 g in sodium chloride  0.9 % 100 mL IVPB  Status:  Discontinued        2 g 200 mL/hr over 30 Minutes Intravenous Every 24 hours 04/09/24 1501 04/09/24 1900   04/09/24 1515  azithromycin  (ZITHROMAX ) 500 mg in sodium chloride  0.9 % 250 mL IVPB        500 mg 250 mL/hr over 60 Minutes Intravenous  Once 04/09/24 1501 04/09/24 1758              Family Communication/Anticipated D/C date and plan/Code Status   DVT prophylaxis: enoxaparin (LOVENOX) injection 40 mg Start: 04/09/24 1830     Code Status: Limited: Do not attempt resuscitation (DNR) -DNR-LIMITED -Do Not Intubate/DNI   Family Communication: Plan discussed with Sammie Crigler (son) and Cary Clarks (daughter-in-law) at the bedside Disposition Plan: Plan to discharge home   Status is: Inpatient Remains inpatient appropriate because: Pneumonia       Subjective:   Interval events noted.  She complains of dry cough, poor appetite, fatigue and weight loss.  No vomiting, diarrhea or chest pain.  Family (son, daughter-in-law and granddaughter) at bedside  Objective:    Vitals:   04/10/24 0801 04/10/24 0823 04/10/24 1149 04/10/24 1259  BP:  (!) 119/51 (!) 109/58 105/70  Pulse:  (!) 101  (!) 105  Resp:  18  18  Temp:  98.6 F (37 C)  (!) 97.5 F (36.4 C)  TempSrc:      SpO2: 97% 99% 96% 100%  Weight:      Height:       No data found.   Intake/Output Summary (Last 24 hours) at 04/10/2024 1320 Last data filed at 04/09/2024 1758 Gross per 24 hour  Intake 1850 ml  Output --  Net 1850 ml   Filed Weights   04/09/24 0957 04/09/24 2057  Weight: 64.4 kg 68.3 kg    Exam:  GEN: NAD SKIN: Warm and  dry EYES: No pallor or icterus ENT: MMM CV: Irregular rate and rhythm, tachycardic PULM: Decreased air entry at the base of the right hemithorax ABD: soft, ND, NT, +BS CNS: AAO x 3, non focal EXT: No edema or tenderness        Data Reviewed:   I have personally reviewed following labs and imaging studies:  Labs: Labs show the following:   Basic Metabolic Panel: Recent Labs  Lab 04/09/24 0959 04/10/24 0430  NA 129* 134*  K 4.3 4.1  CL 97* 103  CO2 24 21*  GLUCOSE 99 101*  BUN 16 15  CREATININE 1.11* 1.04*  CALCIUM  9.3 8.4*  MG 1.8  --    GFR Estimated Creatinine Clearance: 48.8 mL/min (A) (by C-G formula based  on SCr of 1.04 mg/dL (H)). Liver Function Tests: Recent Labs  Lab 04/09/24 0959  AST 22  ALT 11  ALKPHOS 73  BILITOT 0.4  PROT 6.4*  ALBUMIN 2.0*   No results for input(s): "LIPASE", "AMYLASE" in the last 168 hours. No results for input(s): "AMMONIA" in the last 168 hours. Coagulation profile No results for input(s): "INR", "PROTIME" in the last 168 hours.  CBC: Recent Labs  Lab 04/09/24 0959 04/10/24 0430  WBC 11.6* 7.8  HGB 13.4 11.3*  HCT 40.6 35.1*  MCV 88.8 88.0  PLT 382 335   Cardiac Enzymes: No results for input(s): "CKTOTAL", "CKMB", "CKMBINDEX", "TROPONINI" in the last 168 hours. BNP (last 3 results) No results for input(s): "PROBNP" in the last 8760 hours. CBG: No results for input(s): "GLUCAP" in the last 168 hours. D-Dimer: No results for input(s): "DDIMER" in the last 72 hours. Hgb A1c: Recent Labs    04/09/24 0959  HGBA1C 5.4   Lipid Profile: No results for input(s): "CHOL", "HDL", "LDLCALC", "TRIG", "CHOLHDL", "LDLDIRECT" in the last 72 hours. Thyroid  function studies: No results for input(s): "TSH", "T4TOTAL", "T3FREE", "THYROIDAB" in the last 72 hours.  Invalid input(s): "FREET3" Anemia work up: No results for input(s): "VITAMINB12", "FOLATE", "FERRITIN", "TIBC", "IRON", "RETICCTPCT" in the last 72  hours. Sepsis Labs: Recent Labs  Lab 04/09/24 0959 04/10/24 0430  WBC 11.6* 7.8    Microbiology Recent Results (from the past 240 hours)  Resp panel by RT-PCR (RSV, Flu A&B, Covid) Anterior Nasal Swab     Status: None   Collection Time: 04/09/24 10:31 AM   Specimen: Anterior Nasal Swab  Result Value Ref Range Status   SARS Coronavirus 2 by RT PCR NEGATIVE NEGATIVE Final    Comment: (NOTE) SARS-CoV-2 target nucleic acids are NOT DETECTED.  The SARS-CoV-2 RNA is generally detectable in upper respiratory specimens during the acute phase of infection. The lowest concentration of SARS-CoV-2 viral copies this assay can detect is 138 copies/mL. A negative result does not preclude SARS-Cov-2 infection and should not be used as the sole basis for treatment or other patient management decisions. A negative result may occur with  improper specimen collection/handling, submission of specimen other than nasopharyngeal swab, presence of viral mutation(s) within the areas targeted by this assay, and inadequate number of viral copies(<138 copies/mL). A negative result must be combined with clinical observations, patient history, and epidemiological information. The expected result is Negative.  Fact Sheet for Patients:  BloggerCourse.com  Fact Sheet for Healthcare Providers:  SeriousBroker.it  This test is no t yet approved or cleared by the [place]  FDA and  has been authorized for detection and/or diagnosis of SARS-CoV-2 by FDA under an Emergency Use Authorization (EUA). This EUA will remain  in effect (meaning this test can be used) for the duration of the COVID-19 declaration under Section 564(b)(1) of the Act, 21 U.S.C.section 360bbb-3(b)(1), unless the authorization is terminated  or revoked sooner.       Influenza A by PCR NEGATIVE NEGATIVE Final   Influenza B by PCR NEGATIVE NEGATIVE Final    Comment: (NOTE) The Xpert  Xpress SARS-CoV-2/FLU/RSV plus assay is intended as an aid in the diagnosis of influenza from Nasopharyngeal swab specimens and should not be used as a sole basis for treatment. Nasal washings and aspirates are unacceptable for Xpert Xpress SARS-CoV-2/FLU/RSV testing.  Fact Sheet for Patients: BloggerCourse.com  Fact Sheet for Healthcare Providers: SeriousBroker.it  This test is not yet approved or cleared by the [place]  FDA  and has been authorized for detection and/or diagnosis of SARS-CoV-2 by FDA under an Emergency Use Authorization (EUA). This EUA will remain in effect (meaning this test can be used) for the duration of the COVID-19 declaration under Section 564(b)(1) of the Act, 21 U.S.C. section 360bbb-3(b)(1), unless the authorization is terminated or revoked.     Resp Syncytial Virus by PCR NEGATIVE NEGATIVE Final    Comment: (NOTE) Fact Sheet for Patients: BloggerCourse.com  Fact Sheet for Healthcare Providers: SeriousBroker.it  This test is not yet approved or cleared by the [place]  FDA and has been authorized for detection and/or diagnosis of SARS-CoV-2 by FDA under an Emergency Use Authorization (EUA). This EUA will remain in effect (meaning this test can be used) for the duration of the COVID-19 declaration under Section 564(b)(1) of the Act, 21 U.S.C. section 360bbb-3(b)(1), unless the authorization is terminated or revoked.  Performed at [facility], [address] [place], [place] [address]   Blood culture (routine x 2)     Status: None (Preliminary result)   Collection Time: 04/09/24  3:46 PM   Specimen: BLOOD  Result Value Ref Range Status   Specimen Description BLOOD RIGHT ANTECUBITAL  Final   Special Requests   Final    BOTTLES DRAWN AEROBIC AND ANAEROBIC Blood Culture results may not be optimal due to an inadequate volume of blood  received in culture bottles   Culture   Final    NO GROWTH < 24 HOURS Performed at [facility], [address], [place], [place] [address]    Report Status PENDING  Incomplete  Blood culture (routine x 2)     Status: None (Preliminary result)   Collection Time: 04/09/24  4:01 PM   Specimen: BLOOD  Result Value Ref Range Status   Specimen Description BLOOD BLOOD RIGHT ARM  Final   Special Requests   Final    BOTTLES DRAWN AEROBIC AND ANAEROBIC Blood Culture adequate volume   Culture   Final    NO GROWTH < 24 HOURS Performed at [facility], [address] [place], [place] [address]    Report Status PENDING  Incomplete    Procedures and diagnostic studies:  MR BRAIN W WO CONTRAST Result Date: 04/10/2024 CLINICAL DATA:  Abnormal pituitary gland by CT EXAM: MRI HEAD WITHOUT AND WITH CONTRAST TECHNIQUE: Multiplanar, multiecho pulse sequences of the brain and surrounding structures were obtained without and with intravenous contrast. CONTRAST:  6mL GADAVIST GADOBUTROL 1 MMOL/ML IV SOLN COMPARISON:  Head CT from yesterday FINDINGS: Brain: Hypoenhancing (relative to pituitary) mass in the sella, ventral and right eccentric, measuring up to 19 x 10 mm. Although there is evidence of widespread metastatic disease, the epicenter is in the pituitary parenchyma. Dural thickening along the inner table of the left frontal bone measuring 3.2 by 0.9 cm. Milder, thin dural thickening along the inner table of the right frontal bone. Suspect direct invasion of the dura of from calvarial metastases given overlying osseous metastatic disease which is widespread in the calvarium, at the right occipital condyle, and at the C3 body. No acute infarct, acute hemorrhage, hydrocephalus, or collection. Vascular: Major flow voids and vascular enhancements are preserved Skull and upper cervical spine: Implants as above Sinuses/Orbits: No acute finding IMPRESSION: Confirmed pituitary mass measuring 19 x  10 mm. This could be pituitary adenoma or metastasis, recommend close follow-up. 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 Electronically Signed   By: Arlyce Lambert  Watts M.D.   On: 04/10/2024 08:10   CT Angio Chest PE W and/or Wo Contrast Result Date: 04/09/2024 CLINICAL DATA:  Shortness of breath and weakness. Right hilar and lower lobe mass/bronchogenic carcinoma. EXAM: CT ANGIOGRAPHY CHEST WITH CONTRAST TECHNIQUE: Multidetector CT imaging of the chest was performed using the standard protocol during bolus administration of intravenous contrast. Multiplanar CT image reconstructions and MIPs were obtained to evaluate the vascular anatomy. RADIATION DOSE REDUCTION: This exam was performed according to the departmental dose-optimization program which includes automated exposure control, adjustment of the mA and/or kV according to patient size and/or use of iterative reconstruction technique. CONTRAST:  75mL OMNIPAQUE  IOHEXOL  350 MG/ML SOLN COMPARISON:  Chest CT dated 08/02/2017. FINDINGS: Cardiovascular: There is no cardiomegaly. Advanced 3 vessel coronary vascular calcification. Small pericardial effusion measuring 6 mm thickness. Moderate atherosclerotic calcification of the thoracic aorta. No aneurysmal dilatation or dissection. The origins of the great vessels of the aortic arch appear patent. No pulmonary artery embolus identified. Mediastinum/Nodes: There is a 2.8 x 4.1 cm right infrahilar/right lower lobe mass in keeping with known malignancy. Right paratracheal adenopathy measure 19 mm in short axis. Right supraclavicular adenopathy measure up to 22 mm. Bulky right axillary adenopathy measure 17 mm short axis. There is a small hiatal hernia. The esophagus is grossly unremarkable. No mediastinal fluid collection. Lungs/Pleura: Moderate right pleural effusion. There is consolidative changes of the majority of the right lower lobe which may represent  atelectasis or pneumonia versus infiltrative mass. Background of centrilobular emphysema. There is diffuse peribronchial thickening with mucous impaction in the right middle and left lower lobe bronchi. There is high-grade narrowing of the bronchus intermedius which may be related to mucous impaction or tumor infiltration. No pneumothorax. Upper Abdomen: Bilateral adrenal nodules suspicious for metastasis. Faint low attenuating lesions in the liver also concerning for metastasis. There is retroperitoneal adenopathy. Partially visualized 3.5 x 4.0 cm ovoid mass in the portacaval region, likely adenopathy. Musculoskeletal: Osteopenia with degenerative changes of spine. No acute osseous pathology. Review of the MIP images confirms the above findings. IMPRESSION: 1. No CT evidence of pulmonary artery embolus. 2. Right infrahilar/right lower lobe mass in keeping with known malignancy. 3. Moderate right pleural effusion. 4. Consolidative changes of the majority of the right lower lobe may represent atelectasis or pneumonia versus infiltrative mass. 5. Diffuse peribronchial thickening with mucous impaction in the right middle and left lower lobe bronchi. High-grade narrowing of the bronchus intermedius may be related to mucous impaction or tumor infiltration. 6. Bilateral adrenal nodules suspicious for metastasis. 7. Hepatic metastasis. 8. Retroperitoneal adenopathy. 9. Aortic Atherosclerosis (ICD10-I70.0) and Emphysema (ICD10-J43.9). Electronically Signed   By: Angus Bark M.D.   On: 04/09/2024 13:33   DG Chest 2 View Result Date: 04/09/2024 CLINICAL DATA:  Fatigue. Shortness of breath. History of lung cancer. EXAM: CHEST - 2 VIEW COMPARISON:  Chest CT dated 05/02/2023. FINDINGS: Small right pleural effusion and right lung base atelectasis or infiltrate, new since the prior radiograph. The known right infrahilar mass is not visualized due to right lung base density. No pneumothorax. Stable cardiac silhouette.  Atherosclerotic calcification of the aorta. No acute osseous pathology. IMPRESSION: Small right pleural effusion and right lung base atelectasis or infiltrate. Electronically Signed   By: Angus Bark M.D.   On: 04/09/2024 12:29   CT HEAD WO CONTRAST ( ) Result Date: 04/09/2024 CLINICAL DATA:  Provided history: Weakness, unsteady gait, fall with head strike approximately 1.5 weeks ago. EXAM: CT HEAD WITHOUT CONTRAST TECHNIQUE: Contiguous axial images  were obtained from the base of the skull through the vertex without intravenous contrast. RADIATION DOSE REDUCTION: This exam was performed according to the departmental dose-optimization program which includes automated exposure control, adjustment of the mA and/or kV according to patient size and/or use of iterative reconstruction technique. COMPARISON:  Brain MRI 10/15/2010. FINDINGS: Brain: Generalized cerebral atrophy. Mild chronic small vessel ischemic changes within the cerebral white matter, occult by CT and better appreciated on the prior brain MRI of 10/15/2010. Mild prominence of the pituitary gland (with slight bulging into the suprasellar cistern), new from the prior MRI. There is no acute intracranial hemorrhage. No demarcated cortical infarct. No extra-axial fluid collection. No midline shift. Vascular: No hyperdense vessel.  Atherosclerotic calcifications. Skull: No calvarial fracture or aggressive osseous lesion. Sinuses/Orbits: No mass or acute finding within the imaged orbits. Mild mucosal thickening within the left frontal sinus. IMPRESSION: 1. No acute intracranial hemorrhage or evidence of an acute infarct. 2. Mild prominence of the pituitary gland, new from the prior MRI of 10/15/2010 and concerning for a possible underlying pituitary mass. A non-emergent pituitary protocol brain MRI (with and without contrast) is recommended for further evaluation. 3. Mild chronic small vessel ischemic changes within the cerebral white matter. 4.  Generalized cerebral atrophy. 5. Mild left frontal sinus mucosal thickening. Electronically Signed   By: Bascom Lily D.O.   On: 04/09/2024 12:08               LOS: 1 day   Dhrithi Riche  [facility]   Pager on [email or web address]. If 7PM-7AM, please contact night-coverage at [email or web address]     04/10/2024, 1:20 PM

## 2024-04-10 NOTE — Procedures (Signed)
 PROCEDURE SUMMARY:  Successful US  guided right thoracentesis. Yielded 1.3L of dark amber fluid. Patient tolerated procedure well. No immediate complications. EBL = trace  Specimen sent for labs.  Post procedure chest X-ray reveals no pneumothorax  Keryl Gholson Alonzo Artis PA-C 04/10/2024 2:26 PM

## 2024-04-10 NOTE — Plan of Care (Signed)
  Problem: Health Behavior/Discharge Planning: Goal: Ability to manage health-related needs will improve Outcome: Progressing   Problem: Clinical Measurements: Goal: Will remain free from infection Outcome: Progressing   Problem: Nutrition: Goal: Adequate nutrition will be maintained Outcome: Progressing   Problem: Safety: Goal: Ability to remain free from injury will improve Outcome: Progressing

## 2024-04-10 NOTE — Progress Notes (Signed)
 Patient transported to MRI in bed at 0704 in stable condition; return from MRI in bed at 0810 in stable condition.  Patient transferred to ultrasound in bed at 1111 in stable condition; returned in bed at 1225 in stable condition.

## 2024-04-10 NOTE — Evaluation (Signed)
 Clinical/Bedside Swallow Evaluation Patient Details  Name: Michelle Hays MRN: [id number] Date of Birth: October 15, 1946  Today's Date: 04/10/2024 Time: SLP Start Time (ACUTE ONLY): 0905 SLP Stop Time (ACUTE ONLY): 1005 SLP Time Calculation (min) (ACUTE ONLY): 60 min  Past Medical History:  Past Medical History:  Diagnosis Date   Anxiety    Arthritis    Asthma    Atypical squamous cell changes of undetermined significance (ASCUS) on cervical cytology with positive high risk human papilloma virus (HPV) 03/16/2015   BP (high blood pressure) 06/27/2014   Breast cancer (HCC)    Breast cancer, left breast (HCC) 1995   Left Breast    CAFL (chronic airflow limitation) (HCC)    COPD (chronic obstructive pulmonary disease) (HCC)    DDD (degenerative disc disease), lumbar    Depression    Diabetes mellitus without complication (HCC)    Patient takes Januvia   Endometrial cancer determined by uterine biopsy (HCC) 2016   GERD (gastroesophageal reflux disease)    Heart murmur    Hyperlipidemia    Hypertension    IBS (irritable bowel syndrome)    Migraine    Osteopenia    Stroke [facility])    Past Surgical History:  Past Surgical History:  Procedure Laterality Date   ABDOMINAL HYSTERECTOMY     BREAST LUMPECTOMY  2012   [place]-bilateral breast lumpectomy   CATARACT EXTRACTION  2013   CHOLECYSTECTOMY     CHOLECYSTECTOMY, LAPAROSCOPIC     COLONOSCOPY WITH PROPOFOL  N/A 07/06/2016   Procedure: COLONOSCOPY WITH PROPOFOL ;  Surgeon: Deveron Fly, MD;  Location: [facility] ENDOSCOPY;  Service: Endoscopy;  Laterality: N/A;   ESOPHAGOGASTRODUODENOSCOPY  03/02/2008, 10/12/2010, 03/28/2013   ESOPHAGOGASTRODUODENOSCOPY (EGD) WITH PROPOFOL  N/A 07/06/2016   Procedure: ESOPHAGOGASTRODUODENOSCOPY (EGD) WITH PROPOFOL ;  Surgeon: Deveron Fly, MD;  Location: [facility] ENDOSCOPY;  Service: Endoscopy;  Laterality: N/A;   EYE SURGERY     LAPAROSCOPIC BILATERAL SALPINGO OOPHERECTOMY Bilateral 05/20/2015   Procedure:  LAPAROSCOPIC BILATERAL SALPINGO OOPHORECTOMY;  Surgeon: Nobie Batch, MD;  Location: [facility] ORS;  Service: Gynecology;  Laterality: Bilateral;   ROBOTIC ASSISTED TOTAL HYSTERECTOMY N/A 05/20/2015   Procedure: ROBOTIC ASSISTED TOTAL HYSTERECTOMY;  Surgeon: Nobie Batch, MD;  Location: [facility] ORS;  Service: Gynecology;  Laterality: N/A;   HPI:  Pt is a 78 y.o. female with medical history significant of lung cancer right lung for which she never underwent bronchoscopy for definitive diagnosis, hyperlipidemia, asthma/COPD, IBS, hypertension, DM2 presents with family for evaluation of fatigue, progressive weakness, odynophagia/dysphagia and  inability to swallow solids and liquids for the past two weeks. Sometimes regurgitates solid food.  Per chart review, she was first diagnosed with lung malignancy by PET scan in October 2024, obtained as follow up for incidental hilar mass found on CT. She chose to defer bronchoscopy for definitive diagnosis as she was not interested in chemotherapy, and elected to proceed with empiric radiation which she completed at the end of March.   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     Assessment / Plan / Recommendation  Clinical Impression   Pt seen for BSE today. Pt awake, verbal and followed instructions appropriately. Pt A/O x4. Family arrived during session. NSG present to give po meds. OF NOTE: pt and family endorsed episodes of  Regurgitation in recent days.  On [place] 2-3L O2, afebrile. WBC WNL.  Pt appears to present w/ functional oropharyngeal phase swallowing w/ No oropharyngeal phase dysphagia noted, No neuromuscular deficits noted. Pt consumed  po trials given w/ No overt, clinical s/s of aspiration during the po trials.  Pt appears at reduced risk for aspiration from an oropharyngeal phase standpoint when following general aspiration precautions. However, pt does have challenging factors that could impact her oropharyngeal swallowing to include extensive RRL tumor with paratracheal, supraclavicular, and axilary adenopathy and Pulmonary decline, GERD and Esophageal phase Dysmotility, deconditioning/weakness, and missing Lower Dentition for effective mastication of solids. These factors can increase risk for dysphagia as well as decreased oral intake overall.  ANY Esophageal phase Dysmotility or Regurgitation of Reflux material can increase risk for aspiration of the Reflux material during Retrograde flow thus impact Voicing and Pulmonary status.  During po trials, pt consumed consistencies given (thin liquids and loose purees) w/ no overt coughing, decline in vocal quality, or change in respiratory presentation during/post trials. O2 sats 96-98%. Oral phase appeared [facility] w/ timely bolus management and control of bolus propulsion for A-P transfer for swallowing. Oral clearing achieved w/ all trial consistencies. OM Exam appeared [facility] w/ no unilateral weakness noted. Speech Clear. Pt fed self w/ setup support.   Recommend a FULL LIQUID diet consistency in setting of Esophageal phase Dysmotility and Regurgitation events per pt report. w/ loose purees. Thin liquids -- carefully monitor straw use, and pt should Hold Cup when drinking. Recommend general aspiration precautions, STRICT REFLUX PRECAUTIONS. Small bites/sips Slowly w/ rest breaks during meals to allow for clearing. Pills CRUSHED in Puree for safer, easier swallowing -- pt has done this at home per family. Tray setup and sitting up. Education given on Pills CRUSHED in Puree; loose food consistencies and easy to prepare options; general aspiration precautions w/ REFLUX precautions to pt and Son,  family present. Recommend f/u w/ Dietician; GI and Oncology for further assessment/management of the Esophageal Dysmotility. Palliative Care consult for support. MD/NSG to reconsult if any new needs arise during admit. MD/NSG updated.   SLP Visit Diagnosis: Dysphagia, unspecified (R13.10) (Esophageal phase Dysmotility; dx'd RRL tumor with paratracheal, supraclavicular, and axilary adenopathy -- suspect pressure/impact on the Esophagus)    Aspiration Risk  Mild aspiration risk;Risk for inadequate nutrition/hydration (for REGURGITATION of REFLUX)    Diet Recommendation   Thin;Dysphagia 1 (puree) (loose purees -- FULL Liquids diet) = a FULL LIQUID diet consistency in setting of Esophageal phase Dysmotility and Regurgitation events per pt report. w/ loose purees. Thin liquids -- carefully monitor straw use, and pt should Hold Cup when drinking. Recommend general aspiration precautions, STRICT REFLUX PRECAUTIONS. Small bites/sips Slowly w/ rest breaks during meals to allow for clearing. Tray setup and sitting up.  Medication Administration: Crushed with puree    Other  Recommendations Recommended Consults: Consider GI evaluation;Consider esophageal assessment (Dietician; Palliative Care) Oral Care Recommendations: Oral care BID;Patient independent with oral care (support)    Recommendations for follow up therapy are one component of a multi-disciplinary discharge planning process, led by the attending physician.  Recommendations may be updated based on patient status, additional functional criteria and insurance authorization.  Follow up Recommendations No SLP follow up      Assistance Recommended at Discharge  PRN-intermittent  Functional Status Assessment Patient has had a recent decline in their functional status and/or demonstrates limited ability to make significant improvements in function in a reasonable and predictable amount of time  Frequency  and Duration  (n/a)   (n/a)        Prognosis Prognosis for improved oropharyngeal function: Guarded Barriers to Reach Goals: Time post onset;Severity of deficits Barriers/Prognosis Comment: RRL tumor with paratracheal, supraclavicular, and axilary adenopathy; Esophageal phase Dysmotility now      Swallow Study   General Date of Onset: 04/09/24 HPI: Pt is a 79 y.o. female with medical history significant of lung cancer right lung for which she never underwent bronchoscopy for definitive diagnosis, hyperlipidemia, asthma/COPD, IBS, hypertension, DM2 presents with family for evaluation of fatigue, progressive weakness, odynophagia/dysphagia and  inability to swallow solids and liquids for the past two weeks. Sometimes regurgitates solid food.  Per chart review, she was first diagnosed with lung malignancy by PET scan in October 2024, obtained as follow up for incidental hilar mass found on CT. She chose to defer bronchoscopy for definitive diagnosis as she was not interested in chemotherapy, and elected to proceed with empiric radiation which she completed at the end of March.  Today, CTA done today revealed likely progression to stage 4 disease with suspected hepatic and adrenal metastases, as well as extensive RRL tumor with paratracheal, supraclavicular, and axilary adenopathy, moderate right pleural effusion, diffuse peribronchial thickening with mucous impaction, possible tumor infiltration to the bronchus intermedius. Type of Study: Bedside Swallow Evaluation Previous Swallow Assessment: none Diet Prior to this Study: NPO Temperature Spikes Noted: No (wbc 7.8) Respiratory Status: Nasal cannula (2-3L) History of Recent Intubation: No Behavior/Cognition: Alert;Cooperative;Pleasant mood Oral Cavity Assessment: Within Functional Limits Oral Care Completed by SLP: Recent completion by staff Oral Cavity - Dentition: Dentures, top (no bottom dentition) Vision: Functional for self-feeding Self-Feeding Abilities: Able to feed  self;Needs assist;Needs set up Patient Positioning: Upright in bed (supported positioning) Baseline Vocal Quality: Normal Volitional Cough: Strong;Congested Volitional Swallow: Able to elicit    Oral/Motor/Sensory Function Overall Oral Motor/Sensory Function: Within functional limits   Ice Chips Ice chips: Within functional limits Presentation: Spoon (fed; 1 trial)   Thin Liquid Thin Liquid: Within functional limits Presentation: Cup;Self Fed;Straw (~4-5 ozs total) Other Comments: water, Boost juice    Nectar Thick Nectar Thick Liquid: Not tested   Honey Thick Honey Thick Liquid: Not tested   Puree Puree: Within functional limits Presentation: Self Fed;Spoon (10+ trials) Other Comments: loose purees   Solid     Solid: Not tested Other Comments: d/t GI issues        Darla Edward, MS, CCC-SLP Speech Language Pathologist Rehab Services; [facility] [phone] (ascom) Orean Giarratano 04/10/2024,3:31 PM

## 2024-04-11 DIAGNOSIS — Z515 Encounter for palliative care: Secondary | ICD-10-CM | POA: Diagnosis not present

## 2024-04-11 DIAGNOSIS — J189 Pneumonia, unspecified organism: Secondary | ICD-10-CM | POA: Diagnosis not present

## 2024-04-11 DIAGNOSIS — C3411 Malignant neoplasm of upper lobe, right bronchus or lung: Secondary | ICD-10-CM | POA: Diagnosis not present

## 2024-04-11 DIAGNOSIS — C3491 Malignant neoplasm of unspecified part of right bronchus or lung: Secondary | ICD-10-CM | POA: Insufficient documentation

## 2024-04-11 LAB — BASIC METABOLIC PANEL WITH GFR
Anion gap: 4 — ABNORMAL LOW (ref 5–15)
BUN: 16 mg/dL (ref 8–23)
CO2: 24 mmol/L (ref 22–32)
Calcium: 8.3 mg/dL — ABNORMAL LOW (ref 8.9–10.3)
Chloride: 105 mmol/L (ref 98–111)
Creatinine, Ser: 1 mg/dL (ref 0.44–1.00)
GFR, Estimated: 58 mL/min — ABNORMAL LOW (ref >60)
Glucose, Bld: 138 mg/dL — ABNORMAL HIGH (ref 70–99)
Potassium: 4.6 mmol/L (ref 3.5–5.1)
Sodium: 133 mmol/L — ABNORMAL LOW (ref 135–145)

## 2024-04-11 LAB — LACTATE DEHYDROGENASE: LDH: 175 U/L (ref 98–192)

## 2024-04-11 LAB — CBC
HCT: 33.9 % — ABNORMAL LOW (ref 36.0–46.0)
Hemoglobin: 11 g/dL — ABNORMAL LOW (ref 12.0–15.0)
MCH: 28.4 pg (ref 26.0–34.0)
MCHC: 32.4 g/dL (ref 30.0–36.0)
MCV: 87.6 fL (ref 80.0–100.0)
Platelets: 344 10*3/uL (ref 150–400)
RBC: 3.87 MIL/uL (ref 3.87–5.11)
RDW: 14.6 % (ref 11.5–15.5)
WBC: 9.6 10*3/uL (ref 4.0–10.5)
nRBC: 0 % (ref 0.0–0.2)

## 2024-04-11 LAB — RESPIRATORY PANEL BY PCR

## 2024-04-11 LAB — CYTOLOGY - NON PAP

## 2024-04-11 MED ORDER — METOPROLOL TARTRATE 25 MG PO TABS
12.5000 mg | ORAL_TABLET | Freq: Two times a day (BID) | ORAL | Status: DC
  Administered 2024-04-11: 12.5 mg via ORAL
  Filled 2024-04-11: qty 1

## 2024-04-11 MED ORDER — ENSURE ENLIVE PO LIQD: 237.0000 mL | Freq: Two times a day (BID) | ORAL | Status: DC

## 2024-04-11 MED ORDER — METOPROLOL TARTRATE 25 MG PO TABS
25.0000 mg | ORAL_TABLET | Freq: Two times a day (BID) | ORAL | Status: DC
  Filled 2024-04-11: qty 1

## 2024-04-11 NOTE — Progress Notes (Addendum)
 Dr. Leory Rands, notified and provided with current vital signs. Blood pressure 99/50. Map 64. Pulse 109. Temp 98.38F. WBC 9.6 K/uL.

## 2024-04-11 NOTE — Progress Notes (Signed)
 Dr. Leory Rands, notified, received notification from CCMD. Patient had a burst of SVT. Heart rate reached 178 bpm. Heart rate currently at 116 bpm.

## 2024-04-11 NOTE — Consult Note (Signed)
 [facility] CONSULT NOTE  Patient Care Team: Caresse Chant, FNP as PCP - General (Family Medicine) Queenie Brunet, RN as Registered Nurse Randalyn Bushman, Allene Ivan, MD as Consulting Physician (Obstetrics and Gynecology) Schermerhorn, Joselyn Nicely, MD as Referring Physician (Obstetrics and Gynecology) Glenis Langdon, MD as Consulting Physician (Radiation Oncology) Drake Gens, RN as Oncology Nurse Navigator  CHIEF COMPLAINTS/PURPOSE OF CONSULTATION: Lung cancer  HISTORY OF PRESENTING ILLNESS:  Michelle Hays 78 y.o.  female pleasant patient with with multiple medical problems including lung cancer, COPD, diabetes is currently admitted to the hospital for worsening fatigue and also difficulty swallowing.  Patient's working imaging noted to have extensive right lung tumor along with paratracheal supraclavicular axial adenopathy and moderate pleural effusion; suspected liver metastases.  Brain MRI PT diagnosis pituitary metastases.  Given also the concern of pneumonia patient started on antibiotics.  Of note patient was diagnosed with lung cancer in October 2024 when she was found to right hilar mass on the CT scan.  Patient declined any biopsy as she was not interested with systemic therapy at this time.  Patient with frequent underwent definitive radiation however noted to have progressive disease post radiation.  Patient was supposed to follow-up with medical oncology however currently admitted with above ongoing symptoms.  Patient is currently on antibiotics and also breathing treatments.  To have improvement of her respiratory status however not back to baseline.  Oncology has been consulted for further evaluation recommendations.  Review of Systems  Constitutional:  Positive for malaise/fatigue and weight loss. Negative for chills, diaphoresis and fever.  HENT:  Negative for nosebleeds and sore throat.   Eyes:  Negative for double vision.  Respiratory:  Positive for  cough and shortness of breath. Negative for hemoptysis, sputum production and wheezing.   Cardiovascular:  Negative for chest pain, palpitations, orthopnea and leg swelling.  Gastrointestinal:  Negative for abdominal pain, blood in stool, constipation, diarrhea, heartburn, melena, nausea and vomiting.  Genitourinary:  Negative for dysuria, frequency and urgency.  Musculoskeletal:  Positive for back pain and joint pain.  Skin: Negative.  Negative for itching and rash.  Neurological:  Negative for dizziness, tingling, focal weakness, weakness and headaches.  Endo/Heme/Allergies:  Does not bruise/bleed easily.  Psychiatric/Behavioral:  Negative for depression. The patient is not nervous/anxious and does not have insomnia.     MEDICAL HISTORY:  Past Medical History:  Diagnosis Date   Anxiety    Arthritis    Asthma    Atypical squamous cell changes of undetermined significance (ASCUS) on cervical cytology with positive high risk human papilloma virus (HPV) 03/16/2015   BP (high blood pressure) 06/27/2014   Breast cancer (HCC)    Breast cancer, left breast (HCC) 1995   Left Breast    CAFL (chronic airflow limitation) (HCC)    COPD (chronic obstructive pulmonary disease) (HCC)    DDD (degenerative disc disease), lumbar    Depression    Diabetes mellitus without complication (HCC)    Patient takes Januvia   Endometrial cancer determined by uterine biopsy (HCC) 2016   GERD (gastroesophageal reflux disease)    Heart murmur    Hyperlipidemia    Hypertension    IBS (irritable bowel syndrome)    Migraine    Osteopenia    Stroke [facility])     SURGICAL HISTORY: Past Surgical History:  Procedure Laterality Date   ABDOMINAL HYSTERECTOMY     BREAST LUMPECTOMY  2012   [place]nett-bilateral breast lumpectomy   CATARACT EXTRACTION  2013  CHOLECYSTECTOMY     CHOLECYSTECTOMY, LAPAROSCOPIC     COLONOSCOPY WITH PROPOFOL  N/A 07/06/2016   Procedure: COLONOSCOPY WITH PROPOFOL ;  Surgeon: Deveron Fly, MD;  Location: [facility] ENDOSCOPY;  Service: Endoscopy;  Laterality: N/A;   ESOPHAGOGASTRODUODENOSCOPY  03/02/2008, 10/12/2010, 03/28/2013   ESOPHAGOGASTRODUODENOSCOPY (EGD) WITH PROPOFOL  N/A 07/06/2016   Procedure: ESOPHAGOGASTRODUODENOSCOPY (EGD) WITH PROPOFOL ;  Surgeon: Deveron Fly, MD;  Location: [facility] ENDOSCOPY;  Service: Endoscopy;  Laterality: N/A;   EYE SURGERY     LAPAROSCOPIC BILATERAL SALPINGO OOPHERECTOMY Bilateral 05/20/2015   Procedure: LAPAROSCOPIC BILATERAL SALPINGO OOPHORECTOMY;  Surgeon: Nobie Batch, MD;  Location: [facility] ORS;  Service: Gynecology;  Laterality: Bilateral;   ROBOTIC ASSISTED TOTAL HYSTERECTOMY N/A 05/20/2015   Procedure: ROBOTIC ASSISTED TOTAL HYSTERECTOMY;  Surgeon: Nobie Batch, MD;  Location: [facility] ORS;  Service: Gynecology;  Laterality: N/A;    SOCIAL HISTORY: Social History   Socioeconomic History   Marital status: Widowed    Spouse name: Not on file   Number of children: Not on file   Years of education: Not on file   Highest education level: Not on file  Occupational History   Not on file  Tobacco Use   Smoking status: Former    Current packs/day: 2.00    Average packs/day: 2.0 packs/day for 52.0 years (104.0 ttl pk-yrs)    Types: Cigarettes   Smokeless tobacco: Never  Vaping Use   Vaping status: Never Used  Substance and Sexual Activity   Alcohol use: No    Alcohol/week: 0.0 standard drinks of alcohol   Drug use: No   Sexual activity: Not Currently  Other Topics Concern   Not on file  Social History Narrative   Not on file   Social Drivers of Health   Financial Resource Strain: Low Risk  (09/19/2023)   Received from [facility]   Overall Financial Resource Strain (CARDIA)    Difficulty of Paying Living Expenses: Not hard at all  Food Insecurity: No Food Insecurity (12/12/2023)   Hunger Vital Sign    Worried About Running Out of Food in the Last Year: Never true    Ran Out of Food in the  Last Year: Never true  Transportation Needs: No Transportation Needs (12/12/2023)   PRAPARE - Administrator, Civil Service (Medical): No    Lack of Transportation (Non-Medical): No  Physical Activity: Not on file  Stress: Not on file  Social Connections: Not on file  Intimate Partner Violence: Not At Risk (12/12/2023)   Humiliation, Afraid, Rape, and Kick questionnaire    Fear of Current or Ex-Partner: No    Emotionally Abused: No    Physically Abused: No    Sexually Abused: No    FAMILY HISTORY: Family History  Problem Relation Age of Onset   Lung cancer Mother        BONE, BRAIN METS   Brain cancer Mother    Diabetes Mellitus II Mother    Hypertension Mother        X 2 BROTHERS   Coronary artery disease Father    Hypertension Sister    Hyperlipidemia Brother        X 2 BROTHERS   Hypertension Brother    Breast cancer Maternal Aunt     ALLERGIES:  is allergic to alendronate sodium, metformin, nsaids, pantoprazole , salicylic acid, and sulfa antibiotics.  MEDICATIONS:  Current Facility-Administered Medications  Medication Dose Route Frequency Provider Last Rate Last Admin   acetaminophen  (TYLENOL ) tablet 650 mg  650 mg Oral Q6H PRN Krugh, Marissa C, DO       Or   acetaminophen  (TYLENOL ) suppository 650 mg  650 mg Rectal Q6H PRN Krugh, Marissa C, DO       ARIPiprazole  (ABILIFY ) tablet 5 mg  5 mg Oral Daily Krugh, Marissa C, DO   5 mg at 04/10/24 0932   aspirin EC tablet 81 mg  81 mg Oral Daily Krugh, Marissa C, DO   81 mg at 04/10/24 0933   atorvastatin (LIPITOR) tablet 40 mg  40 mg Oral Daily Krugh, Marissa C, DO   40 mg at 04/10/24 0933   budesonide (PULMICORT) nebulizer solution 0.25 mg  0.25 mg Nebulization BID Krugh, Marissa C, DO   0.25 mg at 04/10/24 1932   chlorpheniramine-HYDROcodone  (TUSSIONEX) 10-8 MG/5ML suspension 5 mL  5 mL Oral Q12H PRN Krugh, Marissa C, DO   5 mL at 04/10/24 2250   enoxaparin (LOVENOX) injection 40 mg  40 mg Subcutaneous Q24H  Krugh, Marissa C, DO   40 mg at 04/09/24 2153   guaiFENesin (MUCINEX) 12 hr tablet 600 mg  600 mg Oral BID Krugh, Marissa C, DO   600 mg at 04/10/24 4098   ipratropium-albuterol  (DUONEB) 0.5-2.5 (3) MG/3ML nebulizer solution 3 mL  3 mL Nebulization BID Sheril Dines, MD   3 mL at 04/10/24 1925   LORazepam (ATIVAN) tablet 1 mg  1 mg Oral QHS Krugh, Marissa C, DO   1 mg at 04/09/24 2157   methylPREDNISolone sodium succinate (SOLU-MEDROL) 40 mg/mL injection 40 mg  40 mg Intravenous Q12H Krugh, Marissa C, DO   40 mg at 04/10/24 2159   omeprazole disintegrating tablet 20 mg  20 mg Oral Daily Krugh, Marissa C, DO   20 mg at 04/10/24 1191   ondansetron  (ZOFRAN ) tablet 4 mg  4 mg Oral Q6H PRN Krugh, Marissa C, DO       Or   ondansetron  (ZOFRAN ) injection 4 mg  4 mg Intravenous Q6H PRN Krugh, Marissa C, DO       piperacillin-tazobactam (ZOSYN) IVPB 3.375 g  3.375 g Intravenous Q8H Ananias Balls, RPH 12.5 mL/hr at 04/10/24 2209 3.375 g at 04/10/24 2209    PHYSICAL EXAMINATION:   Vitals:   04/10/24 1932 04/10/24 2026  BP:  (!) 107/55  Pulse:    Resp:  16  Temp:  97.9 F (36.6 C)  SpO2: 97% 95%   Filed Weights   04/09/24 0957 04/09/24 2057  Weight: 142 lb (64.4 kg) 150 lb 9.2 oz (68.3 kg)   Thin cachectic appearing Caucasian female patient resting in the bed.  Accompanied by multiple family members  Physical Exam Vitals and nursing note reviewed.  HENT:     Head: Normocephalic and atraumatic.     Mouth/Throat:     Pharynx: Oropharynx is clear.  Eyes:     Extraocular Movements: Extraocular movements intact.     Pupils: Pupils are equal, round, and reactive to light.  Cardiovascular:     Rate and Rhythm: Normal rate and regular rhythm.  Pulmonary:     Comments: Decreased breath sounds bilaterally.  Abdominal:     Palpations: Abdomen is soft.  Musculoskeletal:        General: Normal range of motion.     Cervical back: Normal range of motion.  Skin:    General: Skin is warm.   Neurological:     General: No focal deficit present.     Mental Status: She is alert and oriented to person,  place, and time.  Psychiatric:        Behavior: Behavior normal.        Judgment: Judgment normal.     LABORATORY DATA:  I have reviewed the data as listed Lab Results  Component Value Date   WBC 7.8 04/10/2024   HGB 11.3 (L) 04/10/2024   HCT 35.1 (L) 04/10/2024   MCV 88.0 04/10/2024   PLT 335 04/10/2024   Recent Labs    01/17/24 1323 04/09/24 0959 04/10/24 0430  NA 134* 129* 134*  K 4.2 4.3 4.1  CL 101 97* 103  CO2 25 24 21*  GLUCOSE 96 99 101*  BUN 17 16 15   CREATININE 0.83 1.11* 1.04*  CALCIUM  8.4* 9.3 8.4*  GFRNONAA >60 51* 55*  PROT 5.5* 6.4*  --   ALBUMIN 2.2* 2.0*  --   AST 17 22  --   ALT 12 11  --   ALKPHOS 74 73  --   BILITOT 0.4 0.4  --     RADIOGRAPHIC STUDIES: I have personally reviewed the radiological images as listed and agreed with the findings in the report. US  THORACENTESIS ASP PLEURAL SPACE W/IMG GUIDE Result Date: 04/10/2024 INDICATION: 78 year old female with a history of right-sided lung cancer who presented to the ED with worsening fatigue. Currently being treated for pneumonia. Request for therapeutic and diagnostic right-sided thoracentesis. EXAM: ULTRASOUND GUIDED RIGHT THORACENTESIS MEDICATIONS: 1% lidocaine , 8 mL COMPLICATIONS: None immediate. PROCEDURE: An ultrasound guided thoracentesis was thoroughly discussed with the patient and questions answered. The benefits, risks, alternatives and complications were also discussed. The patient understands and wishes to proceed with the procedure. Written consent was obtained. Ultrasound was performed to localize and mark an adequate pocket of fluid in the right chest. The area was then prepped and draped in the normal sterile fashion. 1% Lidocaine  was used for local anesthesia. Under ultrasound guidance a 6 Fr Safe-T-Centesis catheter was introduced. Thoracentesis was performed. The catheter  was removed and a dressing applied. FINDINGS: A total of approximately 1.3 L of dark amber fluid was removed. Samples were sent to the laboratory as requested by the clinical team. IMPRESSION: Successful ultrasound guided right thoracentesis yielding 1.3 L of pleural fluid. Procedure performed by: Estella Helling, PA-C under the supervision of Dr. Irine Manning Electronically Signed   By: Elene Griffes M.D.   On: 04/10/2024 14:27   DG Chest Port 1 View Result Date: 04/10/2024 CLINICAL DATA:  Status post right thoracentesis. EXAM: PORTABLE CHEST 1 VIEW COMPARISON:  04/09/2024 and chest CT 04/09/2024 FINDINGS: Improved aeration at the right lung base. Residual right basilar densities could represent atelectasis or small amount of residual pleural fluid. There is a linear opacity overlying the right upper chest that is likely external to the patient. No large pneumothorax. Slightly increased densities in left mid lung are probably related to overlying structures. Heart size is upper limits of normal but stable. Patient is slightly rotated towards the left. Atherosclerotic calcifications at the aortic arch. IMPRESSION: 1. Improved aeration at the right lung base. No significant pneumothorax. 2. Residual right basilar densities could represent atelectasis or small amount of pleural fluid. Electronically Signed   By: Elene Griffes M.D.   On: 04/10/2024 13:23   MR BRAIN W WO CONTRAST Result Date: 04/10/2024 CLINICAL DATA:  Abnormal pituitary gland by CT EXAM: MRI HEAD WITHOUT AND WITH CONTRAST TECHNIQUE: Multiplanar, multiecho pulse sequences of the brain and surrounding structures were obtained without and with intravenous contrast. CONTRAST:  6mL GADAVIST GADOBUTROL 1  MMOL/ML IV SOLN COMPARISON:  Head CT from yesterday FINDINGS: Brain: Hypoenhancing (relative to pituitary) mass in the sella, ventral and right eccentric, measuring up to 19 x 10 mm. Although there is evidence of widespread metastatic disease, the epicenter is in  the pituitary parenchyma. Dural thickening along the inner table of the left frontal bone measuring 3.2 by 0.9 cm. Milder, thin dural thickening along the inner table of the right frontal bone. Suspect direct invasion of the dura of from calvarial metastases given overlying osseous metastatic disease which is widespread in the calvarium, at the right occipital condyle, and at the C3 body. No acute infarct, acute hemorrhage, hydrocephalus, or collection. 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 This could be pituitary adenoma or metastasis, recommend close follow-up. 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 Electronically Signed   By: Ronnette Coke M.D.   On: 04/10/2024 08:10   CT Angio Chest PE W and/or Wo Contrast Result Date: 04/09/2024 CLINICAL DATA:  Shortness of breath and weakness. Right hilar and lower lobe mass/bronchogenic carcinoma. EXAM: CT ANGIOGRAPHY CHEST WITH CONTRAST TECHNIQUE: Multidetector CT imaging of the chest was performed using the standard protocol during bolus administration of intravenous contrast. Multiplanar CT image reconstructions and MIPs were obtained to evaluate the vascular anatomy. RADIATION DOSE REDUCTION: This exam was performed according to the departmental dose-optimization program which includes automated exposure control, adjustment of the mA and/or kV according to patient size and/or use of iterative reconstruction technique. CONTRAST:  75mL OMNIPAQUE  IOHEXOL  350 MG/ML SOLN COMPARISON:  Chest CT dated 08/02/2017. FINDINGS: Cardiovascular: There is no cardiomegaly. Advanced 3 vessel coronary vascular calcification. Small pericardial effusion measuring 6 mm thickness. Moderate atherosclerotic calcification of the thoracic aorta. No aneurysmal  dilatation or dissection. The origins of the great vessels of the aortic arch appear patent. No pulmonary artery embolus identified. Mediastinum/Nodes: There is a 2.8 x 4.1 cm right infrahilar/right lower lobe mass in keeping with known malignancy. Right paratracheal adenopathy measure 19 mm in short axis. Right supraclavicular adenopathy measure up to 22 mm. Bulky right axillary adenopathy measure 17 mm short axis. There is a small hiatal hernia. The esophagus is grossly unremarkable. No mediastinal fluid collection. Lungs/Pleura: Moderate right pleural effusion. There is consolidative changes of the majority of the right lower lobe which may represent atelectasis or pneumonia versus infiltrative mass. Background of centrilobular emphysema. There is diffuse peribronchial thickening with mucous impaction in the right middle and left lower lobe bronchi. There is high-grade narrowing of the bronchus intermedius which may be related to mucous impaction or tumor infiltration. No pneumothorax. Upper Abdomen: Bilateral adrenal nodules suspicious for metastasis. Faint low attenuating lesions in the liver also concerning for metastasis. There is retroperitoneal adenopathy. Partially visualized 3.5 x 4.0 cm ovoid mass in the portacaval region, likely adenopathy. Musculoskeletal: Osteopenia with degenerative changes of spine. No acute osseous pathology. Review of the MIP images confirms the above findings. IMPRESSION: 1. No CT evidence of pulmonary artery embolus. 2. Right infrahilar/right lower lobe mass in keeping with known malignancy. 3. Moderate right pleural effusion. 4. Consolidative changes of the majority of the right lower lobe may represent atelectasis or pneumonia versus infiltrative mass. 5. Diffuse peribronchial thickening with mucous impaction in the right middle and left lower lobe bronchi. High-grade narrowing of the bronchus intermedius may be related to mucous  impaction or tumor infiltration. 6. Bilateral  adrenal nodules suspicious for metastasis. 7. Hepatic metastasis. 8. Retroperitoneal adenopathy. 9. Aortic Atherosclerosis (ICD10-I70.0) and Emphysema (ICD10-J43.9). Electronically Signed   By: Angus Bark M.D.   On: 04/09/2024 13:33   DG Chest 2 View Result Date: 04/09/2024 CLINICAL DATA:  Fatigue. Shortness of breath. History of lung cancer. EXAM: CHEST - 2 VIEW COMPARISON:  Chest CT dated 05/02/2023. FINDINGS: Small right pleural effusion and right lung base atelectasis or infiltrate, new since the prior radiograph. The known right infrahilar mass is not visualized due to right lung base density. No pneumothorax. Stable cardiac silhouette. Atherosclerotic calcification of the aorta. No acute osseous pathology. IMPRESSION: Small right pleural effusion and right lung base atelectasis or infiltrate. Electronically Signed   By: Angus Bark M.D.   On: 04/09/2024 12:29   CT HEAD WO CONTRAST ( ) Result Date: 04/09/2024 CLINICAL DATA:  Provided history: Weakness, unsteady gait, fall with head strike approximately 1.5 weeks ago. EXAM: CT HEAD WITHOUT CONTRAST TECHNIQUE: Contiguous axial images were obtained from the base of the skull through the vertex without intravenous contrast. RADIATION DOSE REDUCTION: This exam was performed according to the departmental dose-optimization program which includes automated exposure control, adjustment of the mA and/or kV according to patient size and/or use of iterative reconstruction technique. COMPARISON:  Brain MRI 10/15/2010. FINDINGS: Brain: Generalized cerebral atrophy. Mild chronic small vessel ischemic changes within the cerebral white matter, occult by CT and better appreciated on the prior brain MRI of 10/15/2010. Mild prominence of the pituitary gland (with slight bulging into the suprasellar cistern), new from the prior MRI. There is no acute intracranial hemorrhage. No demarcated cortical infarct. No extra-axial fluid collection. No midline shift.  Vascular: No hyperdense vessel.  Atherosclerotic calcifications. Skull: No calvarial fracture or aggressive osseous lesion. Sinuses/Orbits: No mass or acute finding within the imaged orbits. Mild mucosal thickening within the left frontal sinus. IMPRESSION: 1. No acute intracranial hemorrhage or evidence of an acute infarct. 2. Mild prominence of the pituitary gland, new from the prior MRI of 10/15/2010 and concerning for a possible underlying pituitary mass. A non-emergent pituitary protocol brain MRI (with and without contrast) is recommended for further evaluation. 3. Mild chronic small vessel ischemic changes within the cerebral white matter. 4. Generalized cerebral atrophy. 5. Mild left frontal sinus mucosal thickening. Electronically Signed   By: Bascom Lily D.O.   On: 04/09/2024 12:08     No problem-specific Assessment & Plan notes found for this encounter.  # 78 year old female patient with multiple medical problems including COPD, diabetes, lung cancer-currently admitted hospital for pneumonia/progressive malignancy.  # Suspected progressive lung malignancy based on imaging-no biopsy.  S/p radiation in fall 2024.  However more recently noted to have progressive disease including right pleural effusion.  Await pleural effusion cytology.  If patient were to be interested in further workup-axillary lymph node biopsy will most accessible.  # Right middle and left lower lobe pneumonia-postobstructive on antibiotics.  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  # Recommendations/plan:  # I had a long discussion with the patient and family regarding the overall guarded prognosis given the progressive disease in the context of patient's in general conservative approach without a biopsy.  Patient is not too keen on any further workup at this time.  If patient is not interested in any further  workup or any treatment options-hospice would be reasonable.  However, continue current scope  of care at this time.  # I recommend further evaluation with palliative care.  Discussed with [employer].  Also discussed with Dr.Ayiuku.  Above plan of care was discussed with patient/and the family in detail.  My contact information was given to the patient/family.     Gwyn Leos, MD 04/11/2024 12:16 AM

## 2024-04-11 NOTE — Progress Notes (Addendum)
 Progress Note    Michelle Hays  WUJ:[id number] DOB: 1946-07-07  DOA: 04/09/2024 PCP: Caresse Chant, FNP      Brief Narrative:    Medical records reviewed and are as summarized below:  Michelle Hays is a 78 y.o. female  with medical history significant for right lung cancer for which she never underwent bronchoscopy for definitive diagnosis, hyperlipidemia, asthma/COPD, IBS, hypertension, type II DM, who presented to the hospital because of fatigue, general weakness, pain and difficulty swallowing for 2 weeks.  She has lost over 10 pounds of weight over the past few weeks.  She also complained of cough and chest pain which started in the ED. She was recently diagnosed with pneumonia and was started on Augmentin  by her PCP without improvement.  Per chart review, she was first diagnosed with lung malignancy by PET scan in October 2024, obtained as follow up for incidental hilar mass found on CT. She chose to defer bronchoscopy for definitive diagnosis as she was not interested in chemotherapy, and elected to proceed with empiric radiation which she completed at the end of March.  Per her last Rad Onc follow up on 5/7, the plan moving forward was for her to re-establish with medical oncology for possible immunotherapy pending a repeat a CT scan, which has not yet been scheduled.  Unfortunately she has had progressive weakness and increased difficulty swallowing for the past few weeks. She was diagnosed with pneumonia and started on Augmentin  by her PC without improvement.   On this admission, she was diagnosed with community-acquired pneumonia.  Imaging revealed right lung cancer with possible metastasis to the liver, adrenal glands, pituitary gland, mediastinal lymph nodes, right supraclavicular, right axillary and retroperitoneal lymph nodes   Assessment/Plan:   Principal Problem:   Bilateral pneumonia Active Problems:   Primary cancer of right upper lobe of lung (HCC)    Palliative care encounter   Body mass index is 21.61 kg/m.   Community-acquired pneumonia in an immunocompromised patient: Improving.  Continue IV Zosyn.  No growth on blood cultures thus far.     Right lung cancer with imaging concerning for metastasis to the liver, adrenal glands, pituitary gland, right supraclavicular, right axillary, mediastinal and retroperitoneal lymph nodes.  She has been seen by Dr. Valentine Gasmen, oncologist.  Patient prefers to follow-up as an outpatient to discuss options.  She is not keen on treatment. Recently completed radiation therapy on 02/19/2024   Right pleural effusion: S/p right-sided thoracentesis with removal of 1.3 L of fluid on 04/10/2024.  Pleural fluid is exudative.  Cytology report is pending.   Sinus tachycardia with PACs, paroxysmal SVT: Heart rate briefly went into the 170s this afternoon.  She was asymptomatic.  Discussed starting metoprolol  to reduce the risk of recurrence given persistent sinus tachycardia and acute illness.  Patient is agreeable.   Acute hypoxic respiratory failure: Improved.  She is tolerating room air.     Dysphagia, odynophagia: Speech therapist recommended full liquid diet   Hyponatremia: Sodium level is stable.  Sodium trend [id number].     General Weakness,, failure to thrive: Likely from underlying malignancy.  PT recommended home health therapy.   Comorbidities include COPD, anxiety, type II DM, hypertension, history of stroke, depression, anxiety      Diet Order             Diet full liquid Room service appropriate? Yes; Fluid consistency: Thin  Diet effective now  Consultants: Oncologist Palliative care  Procedures: Right-sided thoracentesis on 04/10/2024    Medications:    ARIPiprazole   5 mg Oral Daily   aspirin EC  81 mg Oral Daily   atorvastatin  40 mg Oral Daily   budesonide (PULMICORT) nebulizer solution  0.25 mg Nebulization BID    enoxaparin (LOVENOX) injection  40 mg Subcutaneous Q24H   feeding supplement  237 mL Oral BID BM   guaiFENesin  600 mg Oral BID   ipratropium-albuterol   3 mL Nebulization BID   LORazepam  1 mg Oral QHS   metoprolol  tartrate  25 mg Oral BID   omeprazole  20 mg Oral Daily   Continuous Infusions:  piperacillin-tazobactam (ZOSYN)  IV 3.375 g (04/11/24 1423)     Anti-infectives (From admission, onward)    Start     Dose/Rate Route Frequency Ordered Stop   04/09/24 2200  piperacillin-tazobactam (ZOSYN) IVPB 3.375 g        3.375 g 12.5 mL/hr over 240 Minutes Intravenous Every 8 hours 04/09/24 1900     04/09/24 1515  cefTRIAXone (ROCEPHIN) 2 g in sodium chloride  0.9 % 100 mL IVPB  Status:  Discontinued        2 g 200 mL/hr over 30 Minutes Intravenous Every 24 hours 04/09/24 1501 04/09/24 1900   04/09/24 1515  azithromycin  (ZITHROMAX ) 500 mg in sodium chloride  0.9 % 250 mL IVPB        500 mg 250 mL/hr over 60 Minutes Intravenous  Once 04/09/24 1501 04/09/24 1758              Family Communication/Anticipated D/C date and plan/Code Status   DVT prophylaxis: enoxaparin (LOVENOX) injection 40 mg Start: 04/09/24 1830     Code Status: Limited: Do not attempt resuscitation (DNR) -DNR-LIMITED -Do Not Intubate/DNI   Family Communication: Plan discussed with Sammie Crigler (son) at the bedside  Disposition Plan: Plan to discharge home   Status is: Inpatient Remains inpatient appropriate because: Pneumonia       Subjective:   Interval events noted.  She said she feels better today.  No shortness of breath or chest pain.  Cough is getting better.  She is eager to get out of the hospital by tomorrow.  Objective:    Vitals:   04/11/24 0813 04/11/24 1202 04/11/24 1229 04/11/24 1553  BP: (!) 104/49 (!) 99/50  (!) 94/58  Pulse: 98 (!) 109  (!) 114  Resp: 18 18  18   Temp: 97.7 F (36.5 C)  98.1 F (36.7 C) 97.7 F (36.5 C)  TempSrc:  Oral Oral   SpO2: 97% 96%  98%  Weight:       Height:       No data found.   Intake/Output Summary (Last 24 hours) at 04/11/2024 1646 Last data filed at 04/11/2024 1323 Gross per 24 hour  Intake 1120 ml  Output --  Net 1120 ml   Filed Weights   04/09/24 0957 04/09/24 2057  Weight: 64.4 kg 68.3 kg    Exam:  GEN: NAD SKIN: Warm and dry EYES: No pallor or icterus ENT: MMM CV: RRR PULM: Improved air entry bilaterally.  Mild rhonchi at the lung bases ABD: soft, ND, NT, +BS CNS: AAO x 3, non focal EXT: No edema or tenderness      Data Reviewed:   I have personally reviewed following labs and imaging studies:  Labs: Labs show the following:   Basic Metabolic Panel: Recent Labs  Lab 04/09/24 0959 04/10/24 0430 04/11/24 0537  NA 129* 134* 133*  K 4.3 4.1 4.6  CL 97* 103 105  CO2 24 21* 24  GLUCOSE 99 101* 138*  BUN 16 15 16   CREATININE 1.11* 1.04* 1.00  CALCIUM  9.3 8.4* 8.3*  MG 1.8  --   --    GFR Estimated Creatinine Clearance: 50.8 mL/min (by C-G formula based on SCr of 1 mg/dL). Liver Function Tests: Recent Labs  Lab 04/09/24 0959  AST 22  ALT 11  ALKPHOS 73  BILITOT 0.4  PROT 6.4*  ALBUMIN 2.0*   No results for input(s): "LIPASE", "AMYLASE" in the last 168 hours. No results for input(s): "AMMONIA" in the last 168 hours. Coagulation profile No results for input(s): "INR", "PROTIME" in the last 168 hours.  CBC: Recent Labs  Lab 04/09/24 0959 04/10/24 0430 04/11/24 0537  WBC 11.6* 7.8 9.6  HGB 13.4 11.3* 11.0*  HCT 40.6 35.1* 33.9*  MCV 88.8 88.0 87.6  PLT 382 335 344   Cardiac Enzymes: No results for input(s): "CKTOTAL", "CKMB", "CKMBINDEX", "TROPONINI" in the last 168 hours. BNP (last 3 results) No results for input(s): "PROBNP" in the last 8760 hours. CBG: No results for input(s): "GLUCAP" in the last 168 hours. D-Dimer: No results for input(s): "DDIMER" in the last 72 hours. Hgb A1c: Recent Labs    04/09/24 0959  HGBA1C 5.4   Lipid Profile: No results for input(s):  "CHOL", "HDL", "LDLCALC", "TRIG", "CHOLHDL", "LDLDIRECT" in the last 72 hours. Thyroid  function studies: No results for input(s): "TSH", "T4TOTAL", "T3FREE", "THYROIDAB" in the last 72 hours.  Invalid input(s): "FREET3" Anemia work up: No results for input(s): "VITAMINB12", "FOLATE", "FERRITIN", "TIBC", "IRON", "RETICCTPCT" in the last 72 hours. Sepsis Labs: Recent Labs  Lab 04/09/24 0959 04/10/24 0430 04/11/24 0537  WBC 11.6* 7.8 9.6    Microbiology Recent Results (from the past 240 hours)  Resp panel by RT-PCR (RSV, Flu A&B, Covid) Anterior Nasal Swab     Status: None   Collection Time: 04/09/24 10:31 AM   Specimen: Anterior Nasal Swab  Result Value Ref Range Status   SARS Coronavirus 2 by RT PCR NEGATIVE NEGATIVE Final    Comment: (NOTE) SARS-CoV-2 target nucleic acids are NOT DETECTED.  The SARS-CoV-2 RNA is generally detectable in upper respiratory specimens during the acute phase of infection. The lowest concentration of SARS-CoV-2 viral copies this assay can detect is 138 copies/mL. A negative result does not preclude SARS-Cov-2 infection and should not be used as the sole basis for treatment or other patient management decisions. A negative result may occur with  improper specimen collection/handling, submission of specimen other than nasopharyngeal swab, presence of viral mutation(s) within the areas targeted by this assay, and inadequate number of viral copies(<138 copies/mL). A negative result must be combined with clinical observations, patient history, and epidemiological information. The expected result is Negative.  Fact Sheet for Patients:  BloggerCourse.com  Fact Sheet for Healthcare Providers:  SeriousBroker.it  This test is no t yet approved or cleared by the [place]  FDA and  has been authorized for detection and/or diagnosis of SARS-CoV-2 by FDA under an Emergency Use Authorization (EUA). This  EUA will remain  in effect (meaning this test can be used) for the duration of the COVID-19 declaration under Section 564(b)(1) of the Act, 21 U.S.C.section 360bbb-3(b)(1), unless the authorization is terminated  or revoked sooner.       Influenza A by PCR NEGATIVE NEGATIVE Final   Influenza B by PCR NEGATIVE NEGATIVE Final    Comment: (NOTE)  The Xpert Xpress SARS-CoV-2/FLU/RSV plus assay is intended as an aid in the diagnosis of influenza from Nasopharyngeal swab specimens and should not be used as a sole basis for treatment. Nasal washings and aspirates are unacceptable for Xpert Xpress SARS-CoV-2/FLU/RSV testing.  Fact Sheet for Patients: BloggerCourse.com  Fact Sheet for Healthcare Providers: SeriousBroker.it  This test is not yet approved or cleared by the [place]  FDA and has been authorized for detection and/or diagnosis of SARS-CoV-2 by FDA under an Emergency Use Authorization (EUA). This EUA will remain in effect (meaning this test can be used) for the duration of the COVID-19 declaration under Section 564(b)(1) of the Act, 21 U.S.C. section 360bbb-3(b)(1), unless the authorization is terminated or revoked.     Resp Syncytial Virus by PCR NEGATIVE NEGATIVE Final    Comment: (NOTE) Fact Sheet for Patients: BloggerCourse.com  Fact Sheet for Healthcare Providers: SeriousBroker.it  This test is not yet approved or cleared by the [place]  FDA and has been authorized for detection and/or diagnosis of SARS-CoV-2 by FDA under an Emergency Use Authorization (EUA). This EUA will remain in effect (meaning this test can be used) for the duration of the COVID-19 declaration under Section 564(b)(1) of the Act, 21 U.S.C. section 360bbb-3(b)(1), unless the authorization is terminated or revoked.  Performed at [facility], [address] [place], [place]  [address]   Blood culture (routine x 2)     Status: None (Preliminary result)   Collection Time: 04/09/24  3:46 PM   Specimen: BLOOD  Result Value Ref Range Status   Specimen Description BLOOD RIGHT ANTECUBITAL  Final   Special Requests   Final    BOTTLES DRAWN AEROBIC AND ANAEROBIC Blood Culture results may not be optimal due to an inadequate volume of blood received in culture bottles   Culture   Final    NO GROWTH 2 DAYS Performed at [facility], [address], [place], [place] [address]    Report Status PENDING  Incomplete  Blood culture (routine x 2)     Status: None (Preliminary result)   Collection Time: 04/09/24  4:01 PM   Specimen: BLOOD  Result Value Ref Range Status   Specimen Description BLOOD BLOOD RIGHT ARM  Final   Special Requests   Final    BOTTLES DRAWN AEROBIC AND ANAEROBIC Blood Culture adequate volume   Culture   Final    NO GROWTH 2 DAYS Performed at [facility], [address], [place], [place] [address]    Report Status PENDING  Incomplete  Body fluid culture w Gram Stain     Status: None (Preliminary result)   Collection Time: 04/10/24 12:14 PM   Specimen: PATH Cytology Pleural fluid  Result Value Ref Range Status   Specimen Description   Final    PLEURAL Performed at [facility], [address] [place], [place] [address]    Special Requests   Final    NONE Performed at [facility], [address] [place], [place] [address]    Gram Stain NO WBC SEEN NO ORGANISMS SEEN   Final   Culture   Final    NO GROWTH < 24 HOURS Performed at [facility], [address] [place], [place] [address]    Report Status PENDING  Incomplete  Respiratory (~20 pathogens) panel by PCR     Status: None   Collection Time: 04/11/24  6:40 AM   Specimen: Nasopharyngeal Swab; Respiratory  Result Value Ref Range Status  Adenovirus NOT DETECTED NOT DETECTED Final   Coronavirus 229E NOT DETECTED NOT DETECTED Final     Comment: (NOTE) The Coronavirus on the Respiratory Panel, DOES NOT test for the novel  Coronavirus (2019 nCoV)    Coronavirus HKU1 NOT DETECTED NOT DETECTED Final   Coronavirus NL63 NOT DETECTED NOT DETECTED Final   Coronavirus OC43 NOT DETECTED NOT DETECTED Final   Metapneumovirus NOT DETECTED NOT DETECTED Final   Rhinovirus / Enterovirus NOT DETECTED NOT DETECTED Final   Influenza A NOT DETECTED NOT DETECTED Final   Influenza B NOT DETECTED NOT DETECTED Final   Parainfluenza Virus 1 NOT DETECTED NOT DETECTED Final   Parainfluenza Virus 2 NOT DETECTED NOT DETECTED Final   Parainfluenza Virus 3 NOT DETECTED NOT DETECTED Final   Parainfluenza Virus 4 NOT DETECTED NOT DETECTED Final   Respiratory Syncytial Virus NOT DETECTED NOT DETECTED Final   Bordetella pertussis NOT DETECTED NOT DETECTED Final   Bordetella Parapertussis NOT DETECTED NOT DETECTED Final   Chlamydophila pneumoniae NOT DETECTED NOT DETECTED Final   Mycoplasma pneumoniae NOT DETECTED NOT DETECTED Final    Comment: Performed at [facility], [address] [place], [place] [address]    Procedures and diagnostic studies:  US  THORACENTESIS ASP PLEURAL SPACE W/IMG GUIDE Result Date: 04/10/2024 INDICATION: 78 year old female with a history of right-sided lung cancer who presented to the ED with worsening fatigue. Currently being treated for pneumonia. Request for therapeutic and diagnostic right-sided thoracentesis. EXAM: ULTRASOUND GUIDED RIGHT THORACENTESIS MEDICATIONS: 1% lidocaine , 8 mL COMPLICATIONS: None immediate. PROCEDURE: An ultrasound guided thoracentesis was thoroughly discussed with the patient and questions answered. The benefits, risks, alternatives and complications were also discussed. The patient understands and wishes to proceed with the procedure. Written consent was obtained. Ultrasound was performed to localize and mark an adequate pocket of fluid in the right chest. The area was then prepped and draped  in the normal sterile fashion. 1% Lidocaine  was used for local anesthesia. Under ultrasound guidance a 6 Fr Safe-T-Centesis catheter was introduced. Thoracentesis was performed. The catheter was removed and a dressing applied. FINDINGS: A total of approximately 1.3 L of dark amber fluid was removed. Samples were sent to the laboratory as requested by the clinical team. IMPRESSION: Successful ultrasound guided right thoracentesis yielding 1.3 L of pleural fluid. Procedure performed by: Estella Helling, PA-C under the supervision of Dr. Irine Manning Electronically Signed   By: Elene Griffes M.D.   On: 04/10/2024 14:27   DG Chest Port 1 View Result Date: 04/10/2024 CLINICAL DATA:  Status post right thoracentesis. EXAM: PORTABLE CHEST 1 VIEW COMPARISON:  04/09/2024 and chest CT 04/09/2024 FINDINGS: Improved aeration at the right lung base. Residual right basilar densities could represent atelectasis or small amount of residual pleural fluid. There is a linear opacity overlying the right upper chest that is likely external to the patient. No large pneumothorax. Slightly increased densities in left mid lung are probably related to overlying structures. Heart size is upper limits of normal but stable. Patient is slightly rotated towards the left. Atherosclerotic calcifications at the aortic arch. IMPRESSION: 1. Improved aeration at the right lung base. No significant pneumothorax. 2. Residual right basilar densities could represent atelectasis or small amount of pleural fluid. Electronically Signed   By: Elene Griffes M.D.   On: 04/10/2024 13:23   MR BRAIN W WO CONTRAST Result Date: 04/10/2024 CLINICAL DATA:  Abnormal pituitary gland by CT EXAM: MRI HEAD WITHOUT AND WITH CONTRAST TECHNIQUE: Multiplanar, multiecho pulse sequences of  the brain and surrounding structures were obtained without and with intravenous contrast. CONTRAST:  6mL GADAVIST GADOBUTROL 1 MMOL/ML IV SOLN COMPARISON:  Head CT from yesterday FINDINGS: Brain:  Hypoenhancing (relative to pituitary) mass in the sella, ventral and right eccentric, measuring up to 19 x 10 mm. Although there is evidence of widespread metastatic disease, the epicenter is in the pituitary parenchyma. Dural thickening along the inner table of the left frontal bone measuring 3.2 by 0.9 cm. Milder, thin dural thickening along the inner table of the right frontal bone. Suspect direct invasion of the dura of from calvarial metastases given overlying osseous metastatic disease which is widespread in the calvarium, at the right occipital condyle, and at the C3 body. No acute infarct, acute hemorrhage, hydrocephalus, or collection. 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 This could be pituitary adenoma or metastasis, recommend close follow-up. 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 Electronically Signed   By: Ronnette Coke M.D.   On: 04/10/2024 08:10               LOS: 2 days   Jacobie Stamey  [facility]   Pager on [email or web address]. If 7PM-7AM, please contact night-coverage at [email or web address]     04/11/2024, 4:46 PM

## 2024-04-11 NOTE — Evaluation (Signed)
 Physical Therapy Evaluation Patient Details Name: Michelle Hays MRN: [id number] DOB: Oct 11, 1946 Today's Date: 04/11/2024  History of Present Illness  Pt is a 78 yo female that presented to ED for dysphagia, progressive weakness. PMH of presumed lung cancer (noted for progression of disease during hospital admission), asthma, breast cancer, COPD, depression, DM endometrial cancer, HLD, HTN, CVA.   Clinical Impression  Patient alert, agreeable to PT, denied pain. At baseline the pt reported she is ambulatory without AD, lives with family. Normally has assistance for the stairs in/out of the house, and has had 2 falls recently. She was able to perform bed mobility with supervision. Sit <> stand from EOB without AD, CGA. She ambulated ~58ft with IV pole but demonstrated decreased gait velocity, stride length. Improved gait cadence and steadiness with RW, pt agreeable to utilize. Pt returned to room with needs in reach.  Overall the patient demonstrated deficits (see "PT Problem List") that impede the patient's functional abilities, safety, and mobility and would benefit from skilled PT intervention.          If plan is discharge home, recommend the following: Assistance with cooking/housework;Assist for transportation;Help with stairs or ramp for entrance   Can travel by private vehicle        Equipment Recommendations Rolling walker (2 wheels)  Recommendations for Other Services       Functional Status Assessment Patient has had a recent decline in their functional status and demonstrates the ability to make significant improvements in function in a reasonable and predictable amount of time.     Precautions / Restrictions Precautions Precautions: Fall Recall of Precautions/Restrictions: Intact Restrictions Weight Bearing Restrictions Per Provider Order: No      Mobility  Bed Mobility Overal bed mobility: Needs Assistance Bed Mobility: Supine to Sit, Sit to Supine     Supine to  sit: Supervision, Used rails Sit to supine: Supervision        Transfers Overall transfer level: Needs assistance Equipment used: None, Rolling walker (2 wheels) Transfers: Sit to/from Stand Sit to Stand: Contact guard assist, Supervision                Ambulation/Gait Ambulation/Gait assistance: Contact guard assist Gait Distance (Feet): 60 Feet Assistive device: Rolling walker (2 wheels), IV Pole         General Gait Details: decreased step height/length with IV pole, decreased velocity. improved steadiness and pt confidence with RW  Stairs            Wheelchair Mobility     Tilt Bed    Modified Rankin (Stroke Patients Only)       Balance Overall balance assessment: Needs assistance Sitting-balance support: Feet supported Sitting balance-Leahy Scale: Good     Standing balance support: Bilateral upper extremity supported, During functional activity Standing balance-Leahy Scale: Fair                               Pertinent Vitals/Pain Pain Assessment Pain Assessment: No/denies pain    Home Living Family/patient expects to be discharged to:: Private residence Living Arrangements: Children Available Help at Discharge: Family Type of Home: House Home Access: Stairs to enter Entrance Stairs-Rails: Left Entrance Stairs-Number of Steps: 5   Home Layout: One level Home Equipment: None      Prior Function Prior Level of Function : Independent/Modified Independent             Mobility Comments: pt takes sink baths,  ambulatory without AD, family assists with stairs ADLs Comments: family assists as needed     Extremity/Trunk Assessment   Upper Extremity Assessment Upper Extremity Assessment: Overall WFL for tasks assessed    Lower Extremity Assessment Lower Extremity Assessment:  (grossly 4-/5 bilaterally)       Communication        Cognition Arousal: Alert Behavior During Therapy: WFL for tasks assessed/performed    PT - Cognitive impairments: No apparent impairments                                 Cueing       General Comments      Exercises     Assessment/Plan    PT Assessment Patient needs continued PT services  PT Problem List Decreased balance;Decreased mobility;Decreased activity tolerance       PT Treatment Interventions DME instruction;Balance training;Gait training;Neuromuscular re-education;Stair training;Functional mobility training;Patient/family education;Therapeutic activities;Therapeutic exercise    PT Goals (Current goals can be found in the Care Plan section)  Acute Rehab PT Goals Patient Stated Goal: to go home PT Goal Formulation: With patient Time For Goal Achievement: 04/25/24 Potential to Achieve Goals: Good    Frequency Min 2X/week     Co-evaluation               AM-PAC PT "6 Clicks" Mobility  Outcome Measure Help needed turning from your back to your side while in a flat bed without using bedrails?: None Help needed moving from lying on your back to sitting on the side of a flat bed without using bedrails?: None Help needed moving to and from a bed to a chair (including a wheelchair)?: None Help needed standing up from a chair using your arms (e.g., wheelchair or bedside chair)?: None Help needed to walk in hospital room?: A Little Help needed climbing 3-5 steps with a railing? : A Little 6 Click Score: 22    End of Session Equipment Utilized During Treatment: Gait belt Activity Tolerance: Patient tolerated treatment well Patient left: in bed;with call bell/phone within reach;with bed alarm set Nurse Communication: Mobility status PT Visit Diagnosis: Other abnormalities of gait and mobility (R26.89);Difficulty in walking, not elsewhere classified (R26.2);Muscle weakness (generalized) (M62.81)    Time: 1610-9604 PT Time Calculation (min) (ACUTE ONLY): 11 min   Charges:   PT Evaluation $PT Eval Low Complexity: 1 Low PT  Treatments $Therapeutic Activity: 8-22 mins PT General Charges $$ ACUTE PT VISIT: 1 Visit         Darien Eden PT, DPT 3:35 PM,04/11/24

## 2024-04-11 NOTE — Consult Note (Signed)
 Palliative Medicine [facility] at [facility] [phone] [phone]   Name: Michelle Hays Date: 04/11/2024 MRN: [id number]  DOB: 1946-03-31  Patient Care Team: Caresse Chant, FNP as PCP - General (Family Medicine) Queenie Brunet, RN as Registered Nurse Randalyn Bushman, Allene Ivan, MD as Consulting Physician (Obstetrics and Gynecology) Schermerhorn, Joselyn Nicely, MD as Referring Physician (Obstetrics and Gynecology) Glenis Langdon, MD as Consulting Physician (Radiation Oncology) Drake Gens, RN as Oncology Nurse Navigator    REASON FOR CONSULTATION: Michelle Hays is a 78 y.o. female with multiple medical problems including history of presumed lung cancer but patient never received bronchoscopy or tissue diagnosis.  This was treated with XRT.  Patient admitted to hospital on 04/09/2024 with progressive weakness, dysphagia.  CTA shows significant progression of metastatic disease with new involvement of liver and bilateral adrenals.  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  Palliative care consulted to address goals.  SOCIAL HISTORY:     reports that she has quit smoking. Her smoking use included cigarettes. She has a 104 pack-year smoking history. She has never used smokeless tobacco. She reports that she does not drink alcohol and does not use drugs.  Patient is widowed.  Lives at home with her son and family.  Patient did not work outside the home.  ADVANCE DIRECTIVES:  Not on file  CODE STATUS: DNR  PAST MEDICAL HISTORY: Past Medical History:  Diagnosis Date   Anxiety    Arthritis    Asthma    Atypical squamous cell changes of undetermined significance (ASCUS) on cervical cytology with positive high risk human papilloma virus (HPV) 03/16/2015   BP (high blood pressure) 06/27/2014   Breast cancer (HCC)    Breast cancer, left breast (HCC) 1995   Left Breast    CAFL (chronic airflow limitation) (HCC)    COPD  (chronic obstructive pulmonary disease) (HCC)    DDD (degenerative disc disease), lumbar    Depression    Diabetes mellitus without complication (HCC)    Patient takes Januvia   Endometrial cancer determined by uterine biopsy (HCC) 2016   GERD (gastroesophageal reflux disease)    Heart murmur    Hyperlipidemia    Hypertension    IBS (irritable bowel syndrome)    Migraine    Osteopenia    Stroke (HCC)     PAST SURGICAL HISTORY:  Past Surgical History:  Procedure Laterality Date   ABDOMINAL HYSTERECTOMY     BREAST LUMPECTOMY  2012   [place]nett-bilateral breast lumpectomy   CATARACT EXTRACTION  2013   CHOLECYSTECTOMY     CHOLECYSTECTOMY, LAPAROSCOPIC     COLONOSCOPY WITH PROPOFOL  N/A 07/06/2016   Procedure: COLONOSCOPY WITH PROPOFOL ;  Surgeon: Deveron Fly, MD;  Location: [facility] ENDOSCOPY;  Service: Endoscopy;  Laterality: N/A;   ESOPHAGOGASTRODUODENOSCOPY  03/02/2008, 10/12/2010, 03/28/2013   ESOPHAGOGASTRODUODENOSCOPY (EGD) WITH PROPOFOL  N/A 07/06/2016   Procedure: ESOPHAGOGASTRODUODENOSCOPY (EGD) WITH PROPOFOL ;  Surgeon: Deveron Fly, MD;  Location: [facility] ENDOSCOPY;  Service: Endoscopy;  Laterality: N/A;   EYE SURGERY     LAPAROSCOPIC BILATERAL SALPINGO OOPHERECTOMY Bilateral 05/20/2015   Procedure: LAPAROSCOPIC BILATERAL SALPINGO OOPHORECTOMY;  Surgeon: Nobie Batch, MD;  Location: [facility] ORS;  Service: Gynecology;  Laterality: Bilateral;   ROBOTIC ASSISTED TOTAL HYSTERECTOMY N/A 05/20/2015   Procedure: ROBOTIC ASSISTED TOTAL HYSTERECTOMY;  Surgeon: Nobie Batch, MD;  Location: [facility] ORS;  Service: Gynecology;  Laterality: N/A;    HEMATOLOGY/ONCOLOGY HISTORY:  Oncology History  No history exists.    ALLERGIES:  is allergic to alendronate sodium, metformin, nsaids, pantoprazole , salicylic acid, and sulfa antibiotics.  MEDICATIONS:  Current Facility-Administered Medications  Medication Dose Route Frequency Provider Last Rate Last Admin   acetaminophen   (TYLENOL ) tablet 650 mg  650 mg Oral Q6H PRN Krugh, Marissa C, DO       Or   acetaminophen  (TYLENOL ) suppository 650 mg  650 mg Rectal Q6H PRN Krugh, Marissa C, DO       ARIPiprazole  (ABILIFY ) tablet 5 mg  5 mg Oral Daily Krugh, Marissa C, DO   5 mg at 04/11/24 0956   aspirin EC tablet 81 mg  81 mg Oral Daily Krugh, Marissa C, DO   81 mg at 04/11/24 0956   atorvastatin (LIPITOR) tablet 40 mg  40 mg Oral Daily Krugh, Marissa C, DO   40 mg at 04/11/24 0955   budesonide (PULMICORT) nebulizer solution 0.25 mg  0.25 mg Nebulization BID Krugh, Marissa C, DO   0.25 mg at 04/11/24 1610   chlorpheniramine-HYDROcodone  (TUSSIONEX) 10-8 MG/5ML suspension 5 mL  5 mL Oral Q12H PRN Krugh, Marissa C, DO   5 mL at 04/10/24 2250   enoxaparin (LOVENOX) injection 40 mg  40 mg Subcutaneous Q24H Krugh, Marissa C, DO   40 mg at 04/09/24 2153   feeding supplement (ENSURE ENLIVE / ENSURE PLUS) liquid 237 mL  237 mL Oral BID BM Sheril Dines, MD       guaiFENesin (MUCINEX) 12 hr tablet 600 mg  600 mg Oral BID Krugh, Marissa C, DO   600 mg at 04/11/24 9604   ipratropium-albuterol  (DUONEB) 0.5-2.5 (3) MG/3ML nebulizer solution 3 mL  3 mL Nebulization BID Sheril Dines, MD   3 mL at 04/11/24 0727   LORazepam (ATIVAN) tablet 1 mg  1 mg Oral QHS Krugh, Marissa C, DO   1 mg at 04/09/24 2157   methylPREDNISolone sodium succinate (SOLU-MEDROL) 40 mg/mL injection 40 mg  40 mg Intravenous Q12H Krugh, Marissa C, DO   40 mg at 04/11/24 5409   omeprazole disintegrating tablet 20 mg  20 mg Oral Daily Krugh, Marissa C, DO   20 mg at 04/11/24 8119   ondansetron  (ZOFRAN ) tablet 4 mg  4 mg Oral Q6H PRN Krugh, Marissa C, DO       Or   ondansetron  (ZOFRAN ) injection 4 mg  4 mg Intravenous Q6H PRN Krugh, Marissa C, DO       piperacillin-tazobactam (ZOSYN) IVPB 3.375 g  3.375 g Intravenous Q8H Ananias Balls, RPH 12.5 mL/hr at 04/11/24 0635 3.375 g at 04/11/24 0635    VITAL SIGNS: BP (!) 104/49 (BP Location: Right Arm)   Pulse 98    Temp 97.7 F (36.5 C)   Resp 18   Ht 5\' 10"  (1.778 m)   Wt 150 lb 9.2 oz (68.3 kg)   SpO2 97%   BMI 21.61 kg/m  Filed Weights   04/09/24 0957 04/09/24 2057  Weight: 142 lb (64.4 kg) 150 lb 9.2 oz (68.3 kg)    Estimated body mass index is 21.61 kg/m as calculated from the following:   Height as of this encounter: 5\' 10"  (1.778 m).   Weight as of this encounter: 150 lb 9.2 oz (68.3 kg).  LABS: CBC:    Component Value Date/Time   WBC 9.6 04/11/2024 0537   HGB 11.0 (L) 04/11/2024 0537   HGB 11.9 (L) 02/12/2024 1240   HCT 33.9 (L) 04/11/2024 0537   PLT 344 04/11/2024 0537  PLT 266 02/12/2024 1240   MCV 87.6 04/11/2024 0537   NEUTROABS 3.9 01/17/2024 1323   LYMPHSABS 0.8 01/17/2024 1323   MONOABS 0.5 01/17/2024 1323   EOSABS 0.1 01/17/2024 1323   BASOSABS 0.0 01/17/2024 1323   Comprehensive Metabolic Panel:    Component Value Date/Time   NA 133 (L) 04/11/2024 0537   K 4.6 04/11/2024 0537   CL 105 04/11/2024 0537   CO2 24 04/11/2024 0537   BUN 16 04/11/2024 0537   CREATININE 1.00 04/11/2024 0537   CREATININE 0.83 01/17/2024 1323   GLUCOSE 138 (H) 04/11/2024 0537   CALCIUM  8.3 (L) 04/11/2024 0537   AST 22 04/09/2024 0959   AST 17 01/17/2024 1323   ALT 11 04/09/2024 0959   ALT 12 01/17/2024 1323   ALKPHOS 73 04/09/2024 0959   BILITOT 0.4 04/09/2024 0959   BILITOT 0.4 01/17/2024 1323   PROT 6.4 (L) 04/09/2024 0959   ALBUMIN 2.0 (L) 04/09/2024 0959    RADIOGRAPHIC STUDIES: US  THORACENTESIS ASP PLEURAL SPACE W/IMG GUIDE Result Date: 04/10/2024 INDICATION: 78 year old female with a history of right-sided lung cancer who presented to the ED with worsening fatigue. Currently being treated for pneumonia. Request for therapeutic and diagnostic right-sided thoracentesis. EXAM: ULTRASOUND GUIDED RIGHT THORACENTESIS MEDICATIONS: 1% lidocaine , 8 mL COMPLICATIONS: None immediate. PROCEDURE: An ultrasound guided thoracentesis was thoroughly discussed with the patient and  questions answered. The benefits, risks, alternatives and complications were also discussed. The patient understands and wishes to proceed with the procedure. Written consent was obtained. Ultrasound was performed to localize and mark an adequate pocket of fluid in the right chest. The area was then prepped and draped in the normal sterile fashion. 1% Lidocaine  was used for local anesthesia. Under ultrasound guidance a 6 Fr Safe-T-Centesis catheter was introduced. Thoracentesis was performed. The catheter was removed and a dressing applied. FINDINGS: A total of approximately 1.3 L of dark amber fluid was removed. Samples were sent to the laboratory as requested by the clinical team. IMPRESSION: Successful ultrasound guided right thoracentesis yielding 1.3 L of pleural fluid. Procedure performed by: Estella Helling, PA-C under the supervision of Dr. Irine Manning Electronically Signed   By: Elene Griffes M.D.   On: 04/10/2024 14:27   DG Chest Port 1 View Result Date: 04/10/2024 CLINICAL DATA:  Status post right thoracentesis. EXAM: PORTABLE CHEST 1 VIEW COMPARISON:  04/09/2024 and chest CT 04/09/2024 FINDINGS: Improved aeration at the right lung base. Residual right basilar densities could represent atelectasis or small amount of residual pleural fluid. There is a linear opacity overlying the right upper chest that is likely external to the patient. No large pneumothorax. Slightly increased densities in left mid lung are probably related to overlying structures. Heart size is upper limits of normal but stable. Patient is slightly rotated towards the left. Atherosclerotic calcifications at the aortic arch. IMPRESSION: 1. Improved aeration at the right lung base. No significant pneumothorax. 2. Residual right basilar densities could represent atelectasis or small amount of pleural fluid. Electronically Signed   By: Elene Griffes M.D.   On: 04/10/2024 13:23   MR BRAIN W WO CONTRAST Result Date: 04/10/2024 CLINICAL DATA:   Abnormal pituitary gland by CT EXAM: MRI HEAD WITHOUT AND WITH CONTRAST TECHNIQUE: Multiplanar, multiecho pulse sequences of the brain and surrounding structures were obtained without and with intravenous contrast. CONTRAST:  6mL GADAVIST GADOBUTROL 1 MMOL/ML IV SOLN COMPARISON:  Head CT from yesterday FINDINGS: Brain: Hypoenhancing (relative to pituitary) mass in the sella, ventral and right eccentric, measuring  up to 19 x 10 mm. Although there is evidence of widespread metastatic disease, the epicenter is in the pituitary parenchyma. Dural thickening along the inner table of the left frontal bone measuring 3.2 by 0.9 cm. Milder, thin dural thickening along the inner table of the right frontal bone. Suspect direct invasion of the dura of from calvarial metastases given overlying osseous metastatic disease which is widespread in the calvarium, at the right occipital condyle, and at the C3 body. No acute infarct, acute hemorrhage, hydrocephalus, or collection. 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 This could be pituitary adenoma or metastasis, recommend close follow-up. 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 Electronically Signed   By: Ronnette Coke M.D.   On: 04/10/2024 08:10   CT Angio Chest PE W and/or Wo Contrast Result Date: 04/09/2024 CLINICAL DATA:  Shortness of breath and weakness. Right hilar and lower lobe mass/bronchogenic carcinoma. EXAM: CT ANGIOGRAPHY CHEST WITH CONTRAST TECHNIQUE: Multidetector CT imaging of the chest was performed using the standard protocol during bolus administration of intravenous contrast. Multiplanar CT image reconstructions and MIPs were obtained to evaluate the vascular anatomy. RADIATION DOSE REDUCTION: This exam was performed according to  the departmental dose-optimization program which includes automated exposure control, adjustment of the mA and/or kV according to patient size and/or use of iterative reconstruction technique. CONTRAST:  75mL OMNIPAQUE  IOHEXOL  350 MG/ML SOLN COMPARISON:  Chest CT dated 08/02/2017. FINDINGS: Cardiovascular: There is no cardiomegaly. Advanced 3 vessel coronary vascular calcification. Small pericardial effusion measuring 6 mm thickness. Moderate atherosclerotic calcification of the thoracic aorta. No aneurysmal dilatation or dissection. The origins of the great vessels of the aortic arch appear patent. No pulmonary artery embolus identified. Mediastinum/Nodes: There is a 2.8 x 4.1 cm right infrahilar/right lower lobe mass in keeping with known malignancy. Right paratracheal adenopathy measure 19 mm in short axis. Right supraclavicular adenopathy measure up to 22 mm. Bulky right axillary adenopathy measure 17 mm short axis. There is a small hiatal hernia. The esophagus is grossly unremarkable. No mediastinal fluid collection. Lungs/Pleura: Moderate right pleural effusion. There is consolidative changes of the majority of the right lower lobe which may represent atelectasis or pneumonia versus infiltrative mass. Background of centrilobular emphysema. There is diffuse peribronchial thickening with mucous impaction in the right middle and left lower lobe bronchi. There is high-grade narrowing of the bronchus intermedius which may be related to mucous impaction or tumor infiltration. No pneumothorax. Upper Abdomen: Bilateral adrenal nodules suspicious for metastasis. Faint low attenuating lesions in the liver also concerning for metastasis. There is retroperitoneal adenopathy. Partially visualized 3.5 x 4.0 cm ovoid mass in the portacaval region, likely adenopathy. Musculoskeletal: Osteopenia with degenerative changes of spine. No acute osseous pathology. Review of the MIP images confirms the above findings. IMPRESSION: 1.  No CT evidence of pulmonary artery embolus. 2. Right infrahilar/right lower lobe mass in keeping with known malignancy. 3. Moderate right pleural effusion. 4. Consolidative changes of the majority of the right lower lobe may represent atelectasis or pneumonia versus infiltrative mass. 5. Diffuse peribronchial thickening with mucous impaction in the right middle and left lower lobe bronchi. High-grade narrowing of the bronchus intermedius may be related to mucous impaction or tumor infiltration. 6. Bilateral adrenal nodules suspicious for metastasis. 7. Hepatic metastasis. 8. Retroperitoneal adenopathy. 9. Aortic Atherosclerosis (ICD10-I70.0) and Emphysema (ICD10-J43.9).  Electronically Signed   By: Angus Bark M.D.   On: 04/09/2024 13:33   DG Chest 2 View Result Date: 04/09/2024 CLINICAL DATA:  Fatigue. Shortness of breath. History of lung cancer. EXAM: CHEST - 2 VIEW COMPARISON:  Chest CT dated 05/02/2023. FINDINGS: Small right pleural effusion and right lung base atelectasis or infiltrate, new since the prior radiograph. The known right infrahilar mass is not visualized due to right lung base density. No pneumothorax. Stable cardiac silhouette. Atherosclerotic calcification of the aorta. No acute osseous pathology. IMPRESSION: Small right pleural effusion and right lung base atelectasis or infiltrate. Electronically Signed   By: Angus Bark M.D.   On: 04/09/2024 12:29   CT HEAD WO CONTRAST ( ) Result Date: 04/09/2024 CLINICAL DATA:  Provided history: Weakness, unsteady gait, fall with head strike approximately 1.5 weeks ago. EXAM: CT HEAD WITHOUT CONTRAST TECHNIQUE: Contiguous axial images were obtained from the base of the skull through the vertex without intravenous contrast. RADIATION DOSE REDUCTION: This exam was performed according to the departmental dose-optimization program which includes automated exposure control, adjustment of the mA and/or kV according to patient size and/or use of  iterative reconstruction technique. COMPARISON:  Brain MRI 10/15/2010. FINDINGS: Brain: Generalized cerebral atrophy. Mild chronic small vessel ischemic changes within the cerebral white matter, occult by CT and better appreciated on the prior brain MRI of 10/15/2010. Mild prominence of the pituitary gland (with slight bulging into the suprasellar cistern), new from the prior MRI. There is no acute intracranial hemorrhage. No demarcated cortical infarct. No extra-axial fluid collection. No midline shift. Vascular: No hyperdense vessel.  Atherosclerotic calcifications. Skull: No calvarial fracture or aggressive osseous lesion. Sinuses/Orbits: No mass or acute finding within the imaged orbits. Mild mucosal thickening within the left frontal sinus. IMPRESSION: 1. No acute intracranial hemorrhage or evidence of an acute infarct. 2. Mild prominence of the pituitary gland, new from the prior MRI of 10/15/2010 and concerning for a possible underlying pituitary mass. A non-emergent pituitary protocol brain MRI (with and without contrast) is recommended for further evaluation. 3. Mild chronic small vessel ischemic changes within the cerebral white matter. 4. Generalized cerebral atrophy. 5. Mild left frontal sinus mucosal thickening. Electronically Signed   By: Bascom Lily D.O.   On: 04/09/2024 12:08    PERFORMANCE STATUS (ECOG) : 2 - Symptomatic, <50% confined to bed  Review of Systems Unless otherwise noted, a complete review of systems is negative.  Physical Exam General: NAD Cardiovascular: regular rate and rhythm Pulmonary: clear ant fields Abdomen: soft, nontender, + bowel sounds GU: no suprapubic tenderness Extremities: no edema, no joint deformities Skin: no rashes Neurological: Weakness but otherwise nonfocal  IMPRESSION: Patient with history of presumed lung cancer.  Patient initially diagnosed with presumed lung cancer in October 2024.  Patient underwent XRT as she declined bronchoscopy/biopsy  or additional treatments.  She is now admitted to the hospital with progressive weakness.  She is on treatment with pneumonia.  However, imaging now concerning for progression of cancer with metastasis involving the liver, adrenal glands, pituitary gland, and significant supraclavicular/axillary/mediastinal/retroperitoneal nodal metastatic disease.  I met with patient today to discuss goals.  Patient accompanied by her niece.  Patient states that she is aware that she has had significant progression of cancer. She underwent thoracentesis with cytology pending.  We discussed possible further workup. Patient says she is not interested in chemotherapy as she fears this would negatively impact her quality of life. She says her primary goal is to live life to the  fullest. However, she is undecided on whether she would want biopsy vs focusing on best supportive care and hospice. She would like to follow up with Dr. Valentine Gasmen as scheduled in the [facility] on 04/23/24 for further discussion.   PLAN: -Continue current scope of treatment -Outpatient follow-up with oncology/palliative care   Time Total: 50 minutes  Visit consisted of counseling and education dealing with the complex and emotionally intense issues of symptom management and palliative care in the setting of serious and potentially life-threatening illness.Greater than 50%  of this time was spent counseling and coordinating care related to the above assessment and plan.  Signed by: Gerilyn Kobus, PhD, NP-C

## 2024-04-12 DIAGNOSIS — J189 Pneumonia, unspecified organism: Secondary | ICD-10-CM | POA: Diagnosis not present

## 2024-04-12 DIAGNOSIS — J910 Malignant pleural effusion: Secondary | ICD-10-CM | POA: Diagnosis present

## 2024-04-12 MED ORDER — ASPIRIN 81 MG PO CHEW
81.0000 mg | CHEWABLE_TABLET | Freq: Every day | ORAL | Status: DC
  Administered 2024-04-12: 81 mg via ORAL
  Filled 2024-04-12: qty 1

## 2024-04-12 MED ORDER — AMOXICILLIN-POT CLAVULANATE 875-125 MG PO TABS: 1.0000 | ORAL_TABLET | Freq: Two times a day (BID) | ORAL | 0 refills | Status: AC

## 2024-04-12 NOTE — Discharge Summary (Addendum)
 Physician Discharge Summary   Patient: Michelle Hays MRN: [id number] DOB: January 31, 1946  Admit date:     04/09/2024  Discharge date: 04/12/24  Discharge Physician: Sheril Dines   PCP: Caresse Chant, FNP   Recommendations at discharge:   Follow-up with PCP in 1 week Follow-up with oncologist, Dr. Valentine Gasmen, as scheduled on 04/23/2024 or sooner if possible  Discharge Diagnoses: Principal Problem:   Bilateral pneumonia Active Problems:   Small cell lung cancer, right [facility])   Palliative care encounter   Malignant pleural effusion  Resolved Problems:   * No resolved hospital problems. *  Hospital Course:  Michelle Hays is a 78 y.o. female  with medical history significant for right lung cancer for which she never underwent bronchoscopy for definitive diagnosis, hyperlipidemia, asthma/COPD, IBS, hypertension, type II DM, who presented to the hospital because of fatigue, general weakness, pain and difficulty swallowing for 2 weeks.  She has lost over 10 pounds of weight over the past few weeks.  She also complained of cough and chest pain which started in the ED. She was recently diagnosed with pneumonia and was started on Augmentin  by her PCP without improvement.   Per chart review, she was first diagnosed with lung malignancy by PET scan in October 2024, obtained as follow up for incidental hilar mass found on CT. She chose to defer bronchoscopy for definitive diagnosis as she was not interested in chemotherapy, and elected to proceed with empiric radiation which she completed at the end of March.  Per her last Rad Onc follow up on 5/7, the plan moving forward was for her to re-establish with medical oncology for possible immunotherapy pending a repeat a CT scan, which has not yet been scheduled.  Unfortunately she has had progressive weakness and increased difficulty swallowing for the past few weeks. She was diagnosed with pneumonia and started on Augmentin  by her PC without improvement.     On this admission, she was diagnosed with community-acquired pneumonia.  Imaging revealed right lung cancer with possible metastasis to the liver, adrenal glands, pituitary gland, mediastinal lymph nodes, right supraclavicular, right axillary and retroperitoneal lymph nodes     Assessment and Plan:  Community-acquired pneumonia in an immunocompromised patient: Improved.  She will be discharged on 4 days of  Augmentin  to complete 7-day course.  No growth on blood cultures thus far.       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  Pleural fluid analysis was positive for small cell lung cancer. She has been seen by Dr. Valentine Gasmen, oncologist.  Patient prefers to follow-up as an outpatient to discuss options.  She is not keen on treatment. Recently completed radiation therapy on 02/19/2024     Malignant right pleural effusion: S/p right-sided thoracentesis with removal of 1.3 L of fluid on 04/10/2024.  Pleural fluid is exudative.  Cytology report positive for small cell lung cancer     Sinus tachycardia with PACs, paroxysmal SVT: Heart rate briefly went into the 170s on 04/11/2024.  She was asymptomatic.   Overall, sinus tachycardia has improved. She was started on low-dose metoprolol .  However, patient reports having low blood pressure at baseline and has some concerns about long-term metoprolol .  Metoprolol  has been discontinued.   Chronic intermittent hypotension: Asymptomatic   Acute hypoxic respiratory failure: Improved.  She is tolerating room air.       Dysphagia,  odynophagia: Speech therapist recommended full liquid diet     Hyponatremia: Sodium level is stable.  Sodium trend [phone].       General Weakness,, failure to thrive: Likely from underlying malignancy.  PT recommended home health therapy.      Comorbidities include COPD, anxiety, type II DM, hypertension, history of stroke, depression, anxiety     She feels better and wants to go home today.  She is deemed stable for discharge.         Consultants: Oncologist, palliative care Procedures performed: Right thoracentesis Disposition: Home Diet recommendation:  Discharge Diet Orders (From admission, onward)     Start     Ordered   04/12/24 0000  Diet full liquid        04/12/24 1042           Cardiac diet DISCHARGE MEDICATION: Allergies as of 04/12/2024       Reactions   Alendronate Sodium Other (See Comments)   Other Reaction(s): Not available   Metformin Other (See Comments)   Other Reaction(s): Not available   Nsaids    Other Reaction(s): Not available   Pantoprazole  Other (See Comments)   Salicylic Acid Other (See Comments)   Sulfa Antibiotics Rash        Medication List     STOP taking these medications    calcium -vitamin D 500-200 MG-UNIT tablet Commonly known as: OSCAL WITH D   OLANZapine  2.5 MG tablet Commonly known as: ZYPREXA    tiotropium 18 MCG inhalation capsule Commonly known as: SPIRIVA       TAKE these medications    albuterol  108 (90 Base) MCG/ACT inhaler Commonly known as: VENTOLIN  HFA Inhale 2 puffs into the lungs every 4 (four) hours as needed for wheezing. For wheezing   amoxicillin -clavulanate 875-125 MG tablet Commonly known as: AUGMENTIN  Take 1 tablet by mouth 2 (two) times daily for 4 days.   ARIPiprazole  5 MG tablet Commonly known as: ABILIFY  Take 5 mg by mouth daily.   aspirin EC 81 MG tablet Take 1 tablet by mouth daily.   atorvastatin 40 MG tablet Commonly known as: LIPITOR Take 40 mg by mouth daily.   chlorpheniramine-HYDROcodone  10-8 MG/5ML Commonly known as: TUSSIONEX Take 5 mLs by mouth every 12 (twelve) hours as needed for cough.   cholecalciferol 10 MCG (400 UNIT) Tabs tablet Commonly known as: VITAMIN D3 Take 1,000 Units by mouth daily.    loperamide 2 MG capsule Commonly known as: IMODIUM Take by mouth as needed for diarrhea or loose stools.   LORazepam 1 MG tablet Commonly known as: ATIVAN Take 1 mg by mouth at bedtime.   magnesium oxide 400 MG tablet Commonly known as: MAG-OX Take 400 mg by mouth daily.   omeprazole 40 MG capsule Commonly known as: PRILOSEC Take 40 mg by mouth daily.   Prevagen 10 MG Caps Generic drug: Apoaequorin Take 1 capsule by mouth daily.   PROBIOTIC DAILY PO Take 1 capsule by mouth daily.   sitaGLIPtin 100 MG tablet Commonly known as: JANUVIA Take 100 mg by mouth daily.   sucralfate 1 GM/10ML suspension Commonly known as: CARAFATE Take 1 g by mouth 4 (four) times daily -  with meals and at bedtime.   Trelegy Ellipta 200-62.5-25 MCG/ACT Aepb Generic drug: Fluticasone-Umeclidin-Vilant Inhale 1 Inhalation into the lungs daily.        Follow-up Information     Caresse Chant, FNP Follow up.   Specialty: Family Medicine Why: Hospital follow up Contact information: [address]  [place] [address] [phone]         Gwyn Leos, MD. Schedule an appointment as soon as possible for a visit in 1 week(s).   Specialties: Internal Medicine, Oncology Contact information: [address] [place] [address] [phone]                Discharge Exam: Michelle Hays Weights   04/09/24 0957 04/09/24 2057  Weight: 64.4 kg 68.3 kg   GEN: NAD SKIN: Warm and dry EYES: No pallor or icterus ENT: MMM CV: RRR PULM: CTA B ABD: soft, ND, NT, +BS CNS: AAO x 3, non focal EXT: No edema or tenderness   Condition at discharge: stable  The results of significant diagnostics from this hospitalization (including imaging, microbiology, ancillary and laboratory) are listed below for reference.   Imaging Studies: US  THORACENTESIS ASP PLEURAL SPACE W/IMG GUIDE Result Date: 04/10/2024 INDICATION: 78 year old female with a history of right-sided lung cancer who  presented to the ED with worsening fatigue. Currently being treated for pneumonia. Request for therapeutic and diagnostic right-sided thoracentesis. EXAM: ULTRASOUND GUIDED RIGHT THORACENTESIS MEDICATIONS: 1% lidocaine , 8 mL COMPLICATIONS: None immediate. PROCEDURE: An ultrasound guided thoracentesis was thoroughly discussed with the patient and questions answered. The benefits, risks, alternatives and complications were also discussed. The patient understands and wishes to proceed with the procedure. Written consent was obtained. Ultrasound was performed to localize and mark an adequate pocket of fluid in the right chest. The area was then prepped and draped in the normal sterile fashion. 1% Lidocaine  was used for local anesthesia. Under ultrasound guidance a 6 Fr Safe-T-Centesis catheter was introduced. Thoracentesis was performed. The catheter was removed and a dressing applied. FINDINGS: A total of approximately 1.3 L of dark amber fluid was removed. Samples were sent to the laboratory as requested by the clinical team. IMPRESSION: Successful ultrasound guided right thoracentesis yielding 1.3 L of pleural fluid. Procedure performed by: Estella Helling, PA-C under the supervision of Dr. Irine Manning Electronically Signed   By: Elene Griffes M.D.   On: 04/10/2024 14:27   DG Chest Port 1 View Result Date: 04/10/2024 CLINICAL DATA:  Status post right thoracentesis. EXAM: PORTABLE CHEST 1 VIEW COMPARISON:  04/09/2024 and chest CT 04/09/2024 FINDINGS: Improved aeration at the right lung base. Residual right basilar densities could represent atelectasis or small amount of residual pleural fluid. There is a linear opacity overlying the right upper chest that is likely external to the patient. No large pneumothorax. Slightly increased densities in left mid lung are probably related to overlying structures. Heart size is upper limits of normal but stable. Patient is slightly rotated towards the left. Atherosclerotic  calcifications at the aortic arch. IMPRESSION: 1. Improved aeration at the right lung base. No significant pneumothorax. 2. Residual right basilar densities could represent atelectasis or small amount of pleural fluid. Electronically Signed   By: Elene Griffes M.D.   On: 04/10/2024 13:23   MR BRAIN W WO CONTRAST Result Date: 04/10/2024 CLINICAL DATA:  Abnormal pituitary gland by CT EXAM: MRI HEAD WITHOUT AND WITH CONTRAST TECHNIQUE: Multiplanar, multiecho pulse sequences of the brain and surrounding structures were obtained without and with intravenous contrast. CONTRAST:  6mL GADAVIST GADOBUTROL 1 MMOL/ML IV SOLN COMPARISON:  Head CT from yesterday FINDINGS: Brain: Hypoenhancing (relative to pituitary) mass in the sella, ventral and right eccentric, measuring up to 19 x 10 mm. Although there is evidence of widespread metastatic disease, the epicenter is in the pituitary parenchyma. Dural thickening along the inner table  of the left frontal bone measuring 3.2 by 0.9 cm. Milder, thin dural thickening along the inner table of the right frontal bone. Suspect direct invasion of the dura of from calvarial metastases given overlying osseous metastatic disease which is widespread in the calvarium, at the right occipital condyle, and at the C3 body. No acute infarct, acute hemorrhage, hydrocephalus, or collection. 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 This could be pituitary adenoma or metastasis, recommend close follow-up. 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 Electronically Signed   By: Ronnette Coke M.D.   On: 04/10/2024 08:10   CT Angio Chest PE W and/or Wo Contrast Result Date: 04/09/2024 CLINICAL DATA:  Shortness of breath and weakness. Right hilar and lower lobe  mass/bronchogenic carcinoma. EXAM: CT ANGIOGRAPHY CHEST WITH CONTRAST TECHNIQUE: Multidetector CT imaging of the chest was performed using the standard protocol during bolus administration of intravenous contrast. Multiplanar CT image reconstructions and MIPs were obtained to evaluate the vascular anatomy. RADIATION DOSE REDUCTION: This exam was performed according to the departmental dose-optimization program which includes automated exposure control, adjustment of the mA and/or kV according to patient size and/or use of iterative reconstruction technique. CONTRAST:  75mL OMNIPAQUE  IOHEXOL  350 MG/ML SOLN COMPARISON:  Chest CT dated 08/02/2017. FINDINGS: Cardiovascular: There is no cardiomegaly. Advanced 3 vessel coronary vascular calcification. Small pericardial effusion measuring 6 mm thickness. Moderate atherosclerotic calcification of the thoracic aorta. No aneurysmal dilatation or dissection. The origins of the great vessels of the aortic arch appear patent. No pulmonary artery embolus identified. Mediastinum/Nodes: There is a 2.8 x 4.1 cm right infrahilar/right lower lobe mass in keeping with known malignancy. Right paratracheal adenopathy measure 19 mm in short axis. Right supraclavicular adenopathy measure up to 22 mm. Bulky right axillary adenopathy measure 17 mm short axis. There is a small hiatal hernia. The esophagus is grossly unremarkable. No mediastinal fluid collection. Lungs/Pleura: Moderate right pleural effusion. There is consolidative changes of the majority of the right lower lobe which may represent atelectasis or pneumonia versus infiltrative mass. Background of centrilobular emphysema. There is diffuse peribronchial thickening with mucous impaction in the right middle and left lower lobe bronchi. There is high-grade narrowing of the bronchus intermedius which may be related to mucous impaction or tumor infiltration. No pneumothorax. Upper Abdomen: Bilateral adrenal nodules suspicious for  metastasis. Faint low attenuating lesions in the liver also concerning for metastasis. There is retroperitoneal adenopathy. Partially visualized 3.5 x 4.0 cm ovoid mass in the portacaval region, likely adenopathy. Musculoskeletal: Osteopenia with degenerative changes of spine. No acute osseous pathology. Review of the MIP images confirms the above findings. IMPRESSION: 1. No CT evidence of pulmonary artery embolus. 2. Right infrahilar/right lower lobe mass in keeping with known malignancy. 3. Moderate right pleural effusion. 4. Consolidative changes of the majority of the right lower lobe may represent atelectasis or pneumonia versus infiltrative mass. 5. Diffuse peribronchial thickening with mucous impaction in the right middle and left lower lobe bronchi. High-grade narrowing of the bronchus intermedius may be related to mucous impaction or tumor infiltration. 6. Bilateral adrenal nodules suspicious for metastasis. 7. Hepatic metastasis. 8. Retroperitoneal adenopathy. 9. Aortic Atherosclerosis (ICD10-I70.0) and Emphysema (ICD10-J43.9). Electronically Signed   By: Angus Bark M.D.   On: 04/09/2024 13:33   DG Chest 2 View Result Date: 04/09/2024 CLINICAL DATA:  Fatigue.  Shortness of breath. History of lung cancer. EXAM: CHEST - 2 VIEW COMPARISON:  Chest CT dated 05/02/2023. FINDINGS: Small right pleural effusion and right lung base atelectasis or infiltrate, new since the prior radiograph. The known right infrahilar mass is not visualized due to right lung base density. No pneumothorax. Stable cardiac silhouette. Atherosclerotic calcification of the aorta. No acute osseous pathology. IMPRESSION: Small right pleural effusion and right lung base atelectasis or infiltrate. Electronically Signed   By: Angus Bark M.D.   On: 04/09/2024 12:29   CT HEAD WO CONTRAST ( ) Result Date: 04/09/2024 CLINICAL DATA:  Provided history: Weakness, unsteady gait, fall with head strike approximately 1.5 weeks ago.  EXAM: CT HEAD WITHOUT CONTRAST TECHNIQUE: Contiguous axial images were obtained from the base of the skull through the vertex without intravenous contrast. RADIATION DOSE REDUCTION: This exam was performed according to the departmental dose-optimization program which includes automated exposure control, adjustment of the mA and/or kV according to patient size and/or use of iterative reconstruction technique. COMPARISON:  Brain MRI 10/15/2010. FINDINGS: Brain: Generalized cerebral atrophy. Mild chronic small vessel ischemic changes within the cerebral white matter, occult by CT and better appreciated on the prior brain MRI of 10/15/2010. Mild prominence of the pituitary gland (with slight bulging into the suprasellar cistern), new from the prior MRI. There is no acute intracranial hemorrhage. No demarcated cortical infarct. No extra-axial fluid collection. No midline shift. Vascular: No hyperdense vessel.  Atherosclerotic calcifications. Skull: No calvarial fracture or aggressive osseous lesion. Sinuses/Orbits: No mass or acute finding within the imaged orbits. Mild mucosal thickening within the left frontal sinus. IMPRESSION: 1. No acute intracranial hemorrhage or evidence of an acute infarct. 2. Mild prominence of the pituitary gland, new from the prior MRI of 10/15/2010 and concerning for a possible underlying pituitary mass. A non-emergent pituitary protocol brain MRI (with and without contrast) is recommended for further evaluation. 3. Mild chronic small vessel ischemic changes within the cerebral white matter. 4. Generalized cerebral atrophy. 5. Mild left frontal sinus mucosal thickening. Electronically Signed   By: Bascom Lily D.O.   On: 04/09/2024 12:08    Microbiology: Results for orders placed or performed during the hospital encounter of 04/09/24  Resp panel by RT-PCR (RSV, Flu A&B, Covid) Anterior Nasal Swab     Status: None   Collection Time: 04/09/24 10:31 AM   Specimen: Anterior Nasal Swab   Result Value Ref Range Status   SARS Coronavirus 2 by RT PCR NEGATIVE NEGATIVE Final    Comment: (NOTE) SARS-CoV-2 target nucleic acids are NOT DETECTED.  The SARS-CoV-2 RNA is generally detectable in upper respiratory specimens during the acute phase of infection. The lowest concentration of SARS-CoV-2 viral copies this assay can detect is 138 copies/mL. A negative result does not preclude SARS-Cov-2 infection and should not be used as the sole basis for treatment or other patient management decisions. A negative result may occur with  improper specimen collection/handling, submission of specimen other than nasopharyngeal swab, presence of viral mutation(s) within the areas targeted by this assay, and inadequate number of viral copies(<138 copies/mL). A negative result must be combined with clinical observations, patient history, and epidemiological information. The expected result is Negative.  Fact Sheet for Patients:  BloggerCourse.com  Fact Sheet for Healthcare Providers:  SeriousBroker.it  This test is no t yet approved or cleared by the [place]  FDA and  has been authorized for detection and/or diagnosis of SARS-CoV-2 by FDA under an Emergency Use Authorization (EUA). This EUA will remain  in effect (meaning this test can be used) for the duration of the COVID-19 declaration under Section 564(b)(1) of the Act, 21 U.S.C.section 360bbb-3(b)(1), unless the authorization is terminated  or revoked sooner.       Influenza A by PCR NEGATIVE NEGATIVE Final   Influenza B by PCR NEGATIVE NEGATIVE Final    Comment: (NOTE) The Xpert Xpress SARS-CoV-2/FLU/RSV plus assay is intended as an aid in the diagnosis of influenza from Nasopharyngeal swab specimens and should not be used as a sole basis for treatment. Nasal washings and aspirates are unacceptable for Xpert Xpress SARS-CoV-2/FLU/RSV testing.  Fact Sheet for  Patients: BloggerCourse.com  Fact Sheet for Healthcare Providers: SeriousBroker.it  This test is not yet approved or cleared by the [place]  FDA and has been authorized for detection and/or diagnosis of SARS-CoV-2 by FDA under an Emergency Use Authorization (EUA). This EUA will remain in effect (meaning this test can be used) for the duration of the COVID-19 declaration under Section 564(b)(1) of the Act, 21 U.S.C. section 360bbb-3(b)(1), unless the authorization is terminated or revoked.     Resp Syncytial Virus by PCR NEGATIVE NEGATIVE Final    Comment: (NOTE) Fact Sheet for Patients: BloggerCourse.com  Fact Sheet for Healthcare Providers: SeriousBroker.it  This test is not yet approved or cleared by the [place]  FDA and has been authorized for detection and/or diagnosis of SARS-CoV-2 by FDA under an Emergency Use Authorization (EUA). This EUA will remain in effect (meaning this test can be used) for the duration of the COVID-19 declaration under Section 564(b)(1) of the Act, 21 U.S.C. section 360bbb-3(b)(1), unless the authorization is terminated or revoked.  Performed at [facility], [address] [place], [place] [address]   Blood culture (routine x 2)     Status: None (Preliminary result)   Collection Time: 04/09/24  3:46 PM   Specimen: BLOOD  Result Value Ref Range Status   Specimen Description BLOOD RIGHT ANTECUBITAL  Final   Special Requests   Final    BOTTLES DRAWN AEROBIC AND ANAEROBIC Blood Culture results may not be optimal due to an inadequate volume of blood received in culture bottles   Culture   Final    NO GROWTH 3 DAYS Performed at [facility], [address], [place], [place] [address]    Report Status PENDING  Incomplete  Blood culture (routine x 2)     Status: None (Preliminary result)   Collection Time: 04/09/24  4:01  PM   Specimen: BLOOD  Result Value Ref Range Status   Specimen Description BLOOD BLOOD RIGHT ARM  Final   Special Requests   Final    BOTTLES DRAWN AEROBIC AND ANAEROBIC Blood Culture adequate volume   Culture   Final    NO GROWTH 3 DAYS Performed at [facility] [address] [place], [place] [address]    Report Status PENDING  Incomplete  Body fluid culture w Gram Stain     Status: None (Preliminary result)   Collection Time: 04/10/24 12:14 PM   Specimen: PATH Cytology Pleural fluid  Result Value Ref Range Status   Specimen Description   Final    PLEURAL Performed at [facility], [address], [place], [place] [address]    Special Requests   Final    NONE Performed at [facility], [address] [place], [place] [address]    Gram Stain NO WBC SEEN NO ORGANISMS SEEN   Final   Culture   Final  NO GROWTH 2 DAYS NO ANAEROBES ISOLATED; CULTURE IN PROGRESS FOR 5 DAYS Performed at [facility], [address] [place], [place] [address]    Report Status PENDING  Incomplete  Respiratory (~20 pathogens) panel by PCR     Status: None   Collection Time: 04/11/24  6:40 AM   Specimen: Nasopharyngeal Swab; Respiratory  Result Value Ref Range Status   Adenovirus NOT DETECTED NOT DETECTED Final   Coronavirus 229E NOT DETECTED NOT DETECTED Final    Comment: (NOTE) The Coronavirus on the Respiratory Panel, DOES NOT test for the novel  Coronavirus (2019 nCoV)    Coronavirus HKU1 NOT DETECTED NOT DETECTED Final   Coronavirus NL63 NOT DETECTED NOT DETECTED Final   Coronavirus OC43 NOT DETECTED NOT DETECTED Final   Metapneumovirus NOT DETECTED NOT DETECTED Final   Rhinovirus / Enterovirus NOT DETECTED NOT DETECTED Final   Influenza A NOT DETECTED NOT DETECTED Final   Influenza B NOT DETECTED NOT DETECTED Final   Parainfluenza Virus 1 NOT DETECTED NOT DETECTED Final   Parainfluenza Virus 2 NOT DETECTED NOT DETECTED Final   Parainfluenza Virus  3 NOT DETECTED NOT DETECTED Final   Parainfluenza Virus 4 NOT DETECTED NOT DETECTED Final   Respiratory Syncytial Virus NOT DETECTED NOT DETECTED Final   Bordetella pertussis NOT DETECTED NOT DETECTED Final   Bordetella Parapertussis NOT DETECTED NOT DETECTED Final   Chlamydophila pneumoniae NOT DETECTED NOT DETECTED Final   Mycoplasma pneumoniae NOT DETECTED NOT DETECTED Final    Comment: Performed at [facility], [address] [place], [place] [address]    Labs: CBC: Recent Labs  Lab 04/09/24 [phone] 04/10/24 0430 04/11/24 0537  WBC 11.6* 7.8 9.6  HGB 13.4 11.3* 11.0*  HCT 40.6 35.1* 33.9*  MCV 88.8 88.0 87.6  PLT 382 335 344   Basic Metabolic Panel: Recent Labs  Lab 04/09/24 0959 04/10/24 0430 04/11/24 0537  NA 129* 134* 133*  K 4.3 4.1 4.6  CL 97* 103 105  CO2 24 21* 24  GLUCOSE 99 101* 138*  BUN 16 15 16   CREATININE 1.11* 1.04* 1.00  CALCIUM  9.3 8.4* 8.3*  MG 1.8  --   --    Liver Function Tests: Recent Labs  Lab 04/09/24 0959  AST 22  ALT 11  ALKPHOS 73  BILITOT 0.4  PROT 6.4*  ALBUMIN 2.0*   CBG: No results for input(s): "GLUCAP" in the last 168 hours.  Discharge time spent: greater than 30 minutes.  Signed: Sheril Dines, MD [facility] 04/12/2024

## 2024-04-13 LAB — BODY FLUID CULTURE W GRAM STAIN
Culture: NO GROWTH
Gram Stain: NONE SEEN

## 2024-04-14 LAB — CULTURE, BLOOD (ROUTINE X 2)
Culture: NO GROWTH
Culture: NO GROWTH
Special Requests: ADEQUATE

## 2024-04-16 ENCOUNTER — Ambulatory Visit: Admitting: Radiation Oncology

## 2024-04-23 ENCOUNTER — Inpatient Hospital Stay: Attending: Internal Medicine | Admitting: Internal Medicine

## 2024-04-23 ENCOUNTER — Inpatient Hospital Stay (HOSPITAL_BASED_OUTPATIENT_CLINIC_OR_DEPARTMENT_OTHER): Admitting: Hospice and Palliative Medicine

## 2024-04-23 ENCOUNTER — Encounter: Payer: Self-pay | Admitting: Internal Medicine

## 2024-04-23 ENCOUNTER — Encounter: Payer: Self-pay | Admitting: *Deleted

## 2024-04-23 DIAGNOSIS — C7971 Secondary malignant neoplasm of right adrenal gland: Secondary | ICD-10-CM | POA: Insufficient documentation

## 2024-04-23 DIAGNOSIS — Z66 Do not resuscitate: Secondary | ICD-10-CM | POA: Diagnosis not present

## 2024-04-23 DIAGNOSIS — Z87891 Personal history of nicotine dependence: Secondary | ICD-10-CM | POA: Diagnosis not present

## 2024-04-23 DIAGNOSIS — C787 Secondary malignant neoplasm of liver and intrahepatic bile duct: Secondary | ICD-10-CM | POA: Diagnosis not present

## 2024-04-23 DIAGNOSIS — C3491 Malignant neoplasm of unspecified part of right bronchus or lung: Secondary | ICD-10-CM | POA: Diagnosis not present

## 2024-04-23 DIAGNOSIS — Z923 Personal history of irradiation: Secondary | ICD-10-CM | POA: Insufficient documentation

## 2024-04-23 DIAGNOSIS — C3431 Malignant neoplasm of lower lobe, right bronchus or lung: Secondary | ICD-10-CM | POA: Diagnosis present

## 2024-04-23 DIAGNOSIS — C7972 Secondary malignant neoplasm of left adrenal gland: Secondary | ICD-10-CM | POA: Diagnosis not present

## 2024-04-23 DIAGNOSIS — J910 Malignant pleural effusion: Secondary | ICD-10-CM | POA: Diagnosis not present

## 2024-04-23 MED ORDER — MORPHINE SULFATE (CONCENTRATE) 10 MG /0.5 ML PO SOLN: 2.5000 mg | ORAL | 0 refills | Status: DC | PRN

## 2024-04-23 NOTE — Progress Notes (Signed)
  Cancer Center CONSULT NOTE  Patient Care Team: Caresse Chant, FNP as PCP - General (Family Medicine) Queenie Brunet, RN as Registered Nurse Randalyn Bushman, Allene Ivan, MD as Consulting Physician (Obstetrics and Gynecology) Schermerhorn, Joselyn Nicely, MD as Referring Physician (Obstetrics and Gynecology) Glenis Langdon, MD as Consulting Physician (Radiation Oncology) Drake Gens, RN as Oncology Nurse Navigator Gwyn Leos, MD as Consulting Physician (Oncology)  CHIEF COMPLAINTS/PURPOSE OF CONSULTATION: Small cell lung cancer  HISTORY OF PRESENTING ILLNESS: Patient is accompanied by daughter-in-law.  She is a wheelchair. Michelle Hays 78 y.o.  female pleasant patient with  female  with medical history significant for right lung cancer for which she never underwent bronchoscopy for definitive diagnosis, hyperlipidemia, asthma/COPD, IBS, hypertension, type II DM, was recently admitted.  Hospital because of fatigue, general weakness, pain and difficulty swallowing for 2 weeks.  She has lost over 10 pounds of weight over the past few weeks.   Patient noted to have right-sided pleural effusion which was tapped.  Patient was treated for postoperative pneumonia.  And discharged home.  She is currently staying with her son and daughter-in-law.  Patient is so weak she can't get out of the chair by herself. She is having a hard time taking a deep breath. She was discharged from the hospital about a week or two ago from not being able to breath.    Review of Systems  Constitutional:  Positive for malaise/fatigue and weight loss. Negative for chills, diaphoresis and fever.  HENT:  Negative for nosebleeds and sore throat.   Eyes:  Negative for double vision.  Respiratory:  Positive for cough and shortness of breath. Negative for hemoptysis, sputum production and wheezing.   Cardiovascular:  Negative for chest pain, palpitations, orthopnea and leg swelling.  Gastrointestinal:   Negative for abdominal pain, blood in stool, constipation, diarrhea, heartburn, melena, nausea and vomiting.  Genitourinary:  Negative for dysuria, frequency and urgency.  Musculoskeletal:  Positive for back pain and joint pain.  Skin: Negative.  Negative for itching and rash.  Neurological:  Negative for dizziness, tingling, focal weakness, weakness and headaches.  Endo/Heme/Allergies:  Does not bruise/bleed easily.  Psychiatric/Behavioral:  Negative for depression. The patient is not nervous/anxious and does not have insomnia.     MEDICAL HISTORY:  Past Medical History:  Diagnosis Date   Anxiety    Arthritis    Asthma    Atypical squamous cell changes of undetermined significance (ASCUS) on cervical cytology with positive high risk human papilloma virus (HPV) 03/16/2015   BP (high blood pressure) 06/27/2014   Breast cancer (HCC)    Breast cancer, left breast (HCC) 1995   Left Breast    CAFL (chronic airflow limitation) (HCC)    COPD (chronic obstructive pulmonary disease) (HCC)    DDD (degenerative disc disease), lumbar    Depression    Diabetes mellitus without complication (HCC)    Patient takes Januvia   Endometrial cancer determined by uterine biopsy (HCC) 2016   GERD (gastroesophageal reflux disease)    Heart murmur    Hyperlipidemia    Hypertension    IBS (irritable bowel syndrome)    Migraine    Osteopenia    Stroke [facility])     SURGICAL HISTORY: Past Surgical History:  Procedure Laterality Date   ABDOMINAL HYSTERECTOMY     BREAST LUMPECTOMY  2012   [place]nett-bilateral breast lumpectomy   CATARACT EXTRACTION  2013   CHOLECYSTECTOMY     CHOLECYSTECTOMY, LAPAROSCOPIC     COLONOSCOPY  WITH PROPOFOL  N/A 07/06/2016   Procedure: COLONOSCOPY WITH PROPOFOL ;  Surgeon: Deveron Fly, MD;  Location[facility] ENDOSCOPY;  Service: Endoscopy;  Laterality: N/A;   ESOPHAGOGASTRODUODENOSCOPY  03/02/2008, 10/12/2010, 03/28/2013   ESOPHAGOGASTRODUODENOSCOPY (EGD) WITH PROPOFOL  N/A  07/06/2016   Procedure: ESOPHAGOGASTRODUODENOSCOPY (EGD) WITH PROPOFOL ;  Surgeon: Deveron Fly, MD;  Location: [facility] ENDOSCOPY;  Service: Endoscopy;  Laterality: N/A;   EYE SURGERY     LAPAROSCOPIC BILATERAL SALPINGO OOPHERECTOMY Bilateral 05/20/2015   Procedure: LAPAROSCOPIC BILATERAL SALPINGO OOPHORECTOMY;  Surgeon: Nobie Batch, MD;  Location: [facility] ORS;  Service: Gynecology;  Laterality: Bilateral;   ROBOTIC ASSISTED TOTAL HYSTERECTOMY N/A 05/20/2015   Procedure: ROBOTIC ASSISTED TOTAL HYSTERECTOMY;  Surgeon: Nobie Batch, MD;  Location: [facility] ORS;  Service: Gynecology;  Laterality: N/A;    SOCIAL HISTORY: Social History   Socioeconomic History   Marital status: Widowed    Spouse name: Not on file   Number of children: Not on file   Years of education: Not on file   Highest education level: Not on file  Occupational History   Not on file  Tobacco Use   Smoking status: Former    Current packs/day: 2.00    Average packs/day: 2.0 packs/day for 52.0 years (104.0 ttl pk-yrs)    Types: Cigarettes   Smokeless tobacco: Never  Vaping Use   Vaping status: Never Used  Substance and Sexual Activity   Alcohol use: No    Alcohol/week: 0.0 standard drinks of alcohol   Drug use: No   Sexual activity: Not Currently  Other Topics Concern   Not on file  Social History Narrative   Not on file   Social Drivers of Health   Financial Resource Strain: Low Risk  (09/19/2023)   Received from [facility]   Overall Financial Resource Strain (CARDIA)    Difficulty of Paying Living Expenses: Not hard at all  Food Insecurity: No Food Insecurity (04/11/2024)   Hunger Vital Sign    Worried About Running Out of Food in the Last Year: Never true    Ran Out of Food in the Last Year: Never true  Transportation Needs: No Transportation Needs (04/11/2024)   PRAPARE - Administrator, Civil Service (Medical): No    Lack of Transportation (Non-Medical): No   Physical Activity: Not on file  Stress: Not on file  Social Connections: Socially Isolated (04/11/2024)   Social Connection and Isolation Panel [NHANES]    Frequency of Communication with Friends and Family: More than three times a week    Frequency of Social Gatherings with Friends and Family: More than three times a week    Attends Religious Services: Never    Database administrator or Organizations: No    Attends Banker Meetings: Never    Marital Status: Widowed  Intimate Partner Violence: Not At Risk (04/11/2024)   Humiliation, Afraid, Rape, and Kick questionnaire    Fear of Current or Ex-Partner: No    Emotionally Abused: No    Physically Abused: No    Sexually Abused: No    FAMILY HISTORY: Family History  Problem Relation Age of Onset   Lung cancer Mother        BONE, BRAIN METS   Brain cancer Mother    Diabetes Mellitus II Mother    Hypertension Mother        X 2 BROTHERS   Coronary artery disease Father    Hypertension Sister    Hyperlipidemia Brother  X 2 BROTHERS   Hypertension Brother    Breast cancer Maternal Aunt     ALLERGIES:  is allergic to alendronate sodium, metformin, nsaids, pantoprazole , salicylic acid, and sulfa antibiotics.  MEDICATIONS:  Current Outpatient Medications  Medication Sig Dispense Refill   albuterol  (PROVENTIL  HFA;VENTOLIN  HFA) 108 (90 BASE) MCG/ACT inhaler Inhale 2 puffs into the lungs every 4 (four) hours as needed for wheezing. For wheezing     Apoaequorin (PREVAGEN) 10 MG CAPS Take 1 capsule by mouth daily.     ARIPiprazole  (ABILIFY ) 5 MG tablet Take 5 mg by mouth daily.     aspirin  EC 81 MG tablet Take 1 tablet by mouth daily.     atorvastatin  (LIPITOR) 40 MG tablet Take 40 mg by mouth daily.     chlorpheniramine-HYDROcodone  (TUSSIONEX) 10-8 MG/5ML Take 5 mLs by mouth every 12 (twelve) hours as needed for cough. 115 mL 0   cholecalciferol (VITAMIN D) 400 units TABS tablet Take 1,000 Units by mouth daily.      Fluticasone-Umeclidin-Vilant (TRELEGY ELLIPTA) 200-62.5-25 MCG/ACT AEPB Inhale 1 Inhalation into the lungs daily.     loperamide (IMODIUM) 2 MG capsule Take by mouth as needed for diarrhea or loose stools.     LORazepam  (ATIVAN ) 1 MG tablet Take 1 mg by mouth at bedtime.     magnesium oxide (MAG-OX) 400 MG tablet Take 400 mg by mouth daily.     Morphine  Sulfate (MORPHINE  CONCENTRATE) 10 mg / 0.5 ml concentrated solution Take 0.13-0.25 mLs (2.6-5 mg total) by mouth every 2 (two) hours as needed for shortness of breath (pain). 30 mL 0   omeprazole  (PRILOSEC) 40 MG capsule Take 40 mg by mouth daily.     Probiotic Product (PROBIOTIC DAILY PO) Take 1 capsule by mouth daily.     sitaGLIPtin (JANUVIA) 100 MG tablet Take 100 mg by mouth daily.     sucralfate (CARAFATE) 1 GM/10ML suspension Take 1 g by mouth 4 (four) times daily -  with meals and at bedtime.     No current facility-administered medications for this visit.    PHYSICAL EXAMINATION:   Vitals:   04/23/24 0925  BP: (!) 88/66  Pulse: (!) 115  Resp: 16  Temp: (!) 97.5 F (36.4 C)  SpO2: 96%   Filed Weights   04/23/24 0925  Weight: 140 lb (63.5 kg)    Physical Exam Vitals and nursing note reviewed.  HENT:     Head: Normocephalic and atraumatic.     Mouth/Throat:     Pharynx: Oropharynx is clear.  Eyes:     Extraocular Movements: Extraocular movements intact.     Pupils: Pupils are equal, round, and reactive to light.  Cardiovascular:     Rate and Rhythm: Normal rate and regular rhythm.  Pulmonary:     Comments: Decreased breath sounds bilaterally.  Abdominal:     Palpations: Abdomen is soft.  Musculoskeletal:        General: Normal range of motion.     Cervical back: Normal range of motion.  Skin:    General: Skin is warm.  Neurological:     General: No focal deficit present.     Mental Status: She is alert and oriented to person, place, and time.  Psychiatric:        Behavior: Behavior normal.        Judgment:  Judgment normal.     LABORATORY DATA:  I have reviewed the data as listed Lab Results  Component Value Date   WBC  9.6 04/11/2024   HGB 11.0 (L) 04/11/2024   HCT 33.9 (L) 04/11/2024   MCV 87.6 04/11/2024   PLT 344 04/11/2024   Recent Labs    01/17/24 1323 04/09/24 0959 04/10/24 0430 04/11/24 0537  NA 134* 129* 134* 133*  K 4.2 4.3 4.1 4.6  CL 101 97* 103 105  CO2 25 24 21* 24  GLUCOSE 96 99 101* 138*  BUN 17 16 15 16   CREATININE 0.83 1.11* 1.04* 1.00  CALCIUM  8.4* 9.3 8.4* 8.3*  GFRNONAA >60 51* 55* 58*  PROT 5.5* 6.4*  --   --   ALBUMIN 2.2* 2.0*  --   --   AST 17 22  --   --   ALT 12 11  --   --   ALKPHOS 74 73  --   --   BILITOT 0.4 0.4  --   --     RADIOGRAPHIC STUDIES: I have personally reviewed the radiological images as listed and agreed with the findings in the report. US  THORACENTESIS ASP PLEURAL SPACE W/IMG GUIDE Result Date: 04/10/2024 INDICATION: 78 year old female with a history of right-sided lung cancer who presented to the ED with worsening fatigue. Currently being treated for pneumonia. Request for therapeutic and diagnostic right-sided thoracentesis. EXAM: ULTRASOUND GUIDED RIGHT THORACENTESIS MEDICATIONS: 1% lidocaine , 8 mL COMPLICATIONS: None immediate. PROCEDURE: An ultrasound guided thoracentesis was thoroughly discussed with the patient and questions answered. The benefits, risks, alternatives and complications were also discussed. The patient understands and wishes to proceed with the procedure. Written consent was obtained. Ultrasound was performed to localize and mark an adequate pocket of fluid in the right chest. The area was then prepped and draped in the normal sterile fashion. 1% Lidocaine  was used for local anesthesia. Under ultrasound guidance a 6 Fr Safe-T-Centesis catheter was introduced. Thoracentesis was performed. The catheter was removed and a dressing applied. FINDINGS: A total of approximately 1.3 L of dark amber fluid was removed.  Samples were sent to the laboratory as requested by the clinical team. IMPRESSION: Successful ultrasound guided right thoracentesis yielding 1.3 L of pleural fluid. Procedure performed by: Estella Helling, PA-C under the supervision of Dr. Irine Manning Electronically Signed   By: Elene Griffes M.D.   On: 04/10/2024 14:27   DG Chest Port 1 View Result Date: 04/10/2024 CLINICAL DATA:  Status post right thoracentesis. EXAM: PORTABLE CHEST 1 VIEW COMPARISON:  04/09/2024 and chest CT 04/09/2024 FINDINGS: Improved aeration at the right lung base. Residual right basilar densities could represent atelectasis or small amount of residual pleural fluid. There is a linear opacity overlying the right upper chest that is likely external to the patient. No large pneumothorax. Slightly increased densities in left mid lung are probably related to overlying structures. Heart size is upper limits of normal but stable. Patient is slightly rotated towards the left. Atherosclerotic calcifications at the aortic arch. IMPRESSION: 1. Improved aeration at the right lung base. No significant pneumothorax. 2. Residual right basilar densities could represent atelectasis or small amount of pleural fluid. Electronically Signed   By: Elene Griffes M.D.   On: 04/10/2024 13:23   MR BRAIN W WO CONTRAST Result Date: 04/10/2024 CLINICAL DATA:  Abnormal pituitary gland by CT EXAM: MRI HEAD WITHOUT AND WITH CONTRAST TECHNIQUE: Multiplanar, multiecho pulse sequences of the brain and surrounding structures were obtained without and with intravenous contrast. CONTRAST:  6mL GADAVIST  GADOBUTROL  1 MMOL/ML IV SOLN COMPARISON:  Head CT from yesterday FINDINGS: Brain: Hypoenhancing (relative to pituitary) mass in the sella,  ventral and right eccentric, measuring up to 19 x 10 mm. Although there is evidence of widespread metastatic disease, the epicenter is in the pituitary parenchyma. Dural thickening along the inner table of the left frontal bone measuring 3.2 by  0.9 cm. Milder, thin dural thickening along the inner table of the right frontal bone. Suspect direct invasion of the dura of from calvarial metastases given overlying osseous metastatic disease which is widespread in the calvarium, at the right occipital condyle, and at the C3 body. No acute infarct, acute hemorrhage, hydrocephalus, or collection. 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 This could be pituitary adenoma or metastasis, recommend close follow-up. 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 Electronically Signed   By: Ronnette Coke M.D.   On: 04/10/2024 08:10   CT Angio Chest PE W and/or Wo Contrast Result Date: 04/09/2024 CLINICAL DATA:  Shortness of breath and weakness. Right hilar and lower lobe mass/bronchogenic carcinoma. EXAM: CT ANGIOGRAPHY CHEST WITH CONTRAST TECHNIQUE: Multidetector CT imaging of the chest was performed using the standard protocol during bolus administration of intravenous contrast. Multiplanar CT image reconstructions and MIPs were obtained to evaluate the vascular anatomy. RADIATION DOSE REDUCTION: This exam was performed according to the departmental dose-optimization program which includes automated exposure control, adjustment of the mA and/or kV according to patient size and/or use of iterative reconstruction technique. CONTRAST:  75mL OMNIPAQUE  IOHEXOL  350 MG/ML SOLN COMPARISON:  Chest CT dated 08/02/2017. FINDINGS: Cardiovascular: There is no cardiomegaly. Advanced 3 vessel coronary vascular calcification. Small pericardial effusion measuring 6 mm thickness. Moderate atherosclerotic calcification of the thoracic aorta. No aneurysmal dilatation or dissection. The origins of the great vessels of the aortic arch appear patent. No pulmonary  artery embolus identified. Mediastinum/Nodes: There is a 2.8 x 4.1 cm right infrahilar/right lower lobe mass in keeping with known malignancy. Right paratracheal adenopathy measure 19 mm in short axis. Right supraclavicular adenopathy measure up to 22 mm. Bulky right axillary adenopathy measure 17 mm short axis. There is a small hiatal hernia. The esophagus is grossly unremarkable. No mediastinal fluid collection. Lungs/Pleura: Moderate right pleural effusion. There is consolidative changes of the majority of the right lower lobe which may represent atelectasis or pneumonia versus infiltrative mass. Background of centrilobular emphysema. There is diffuse peribronchial thickening with mucous impaction in the right middle and left lower lobe bronchi. There is high-grade narrowing of the bronchus intermedius which may be related to mucous impaction or tumor infiltration. No pneumothorax. Upper Abdomen: Bilateral adrenal nodules suspicious for metastasis. Faint low attenuating lesions in the liver also concerning for metastasis. There is retroperitoneal adenopathy. Partially visualized 3.5 x 4.0 cm ovoid mass in the portacaval region, likely adenopathy. Musculoskeletal: Osteopenia with degenerative changes of spine. No acute osseous pathology. Review of the MIP images confirms the above findings. IMPRESSION: 1. No CT evidence of pulmonary artery embolus. 2. Right infrahilar/right lower lobe mass in keeping with known malignancy. 3. Moderate right pleural effusion. 4. Consolidative changes of the majority of the right lower lobe may represent atelectasis or pneumonia versus infiltrative mass. 5. Diffuse peribronchial thickening with mucous impaction in the right middle and left lower lobe bronchi. High-grade narrowing of the bronchus intermedius may be related to mucous impaction or tumor infiltration. 6. Bilateral adrenal nodules suspicious for metastasis. 7. Hepatic metastasis. 8. Retroperitoneal adenopathy. 9. Aortic  Atherosclerosis (ICD10-I70.0) and Emphysema (ICD10-J43.9). Electronically Signed   By: Angus Bark M.D.   On: 04/09/2024 13:33   DG Chest 2 View Result Date: 04/09/2024 CLINICAL DATA:  Fatigue. Shortness of breath. History of lung cancer. EXAM: CHEST - 2 VIEW COMPARISON:  Chest CT dated 05/02/2023. FINDINGS: Small right pleural effusion and right lung base atelectasis or infiltrate, new since the prior radiograph. The known right infrahilar mass is not visualized due to right lung base density. No pneumothorax. Stable cardiac silhouette. Atherosclerotic calcification of the aorta. No acute osseous pathology. IMPRESSION: Small right pleural effusion and right lung base atelectasis or infiltrate. Electronically Signed   By: Angus Bark M.D.   On: 04/09/2024 12:29   CT HEAD WO CONTRAST ( ) Result Date: 04/09/2024 CLINICAL DATA:  Provided history: Weakness, unsteady gait, fall with head strike approximately 1.5 weeks ago. EXAM: CT HEAD WITHOUT CONTRAST TECHNIQUE: Contiguous axial images were obtained from the base of the skull through the vertex without intravenous contrast. RADIATION DOSE REDUCTION: This exam was performed according to the departmental dose-optimization program which includes automated exposure control, adjustment of the mA and/or kV according to patient size and/or use of iterative reconstruction technique. COMPARISON:  Brain MRI 10/15/2010. FINDINGS: Brain: Generalized cerebral atrophy. Mild chronic small vessel ischemic changes within the cerebral white matter, occult by CT and better appreciated on the prior brain MRI of 10/15/2010. Mild prominence of the pituitary gland (with slight bulging into the suprasellar cistern), new from the prior MRI. There is no acute intracranial hemorrhage. No demarcated cortical infarct. No extra-axial fluid collection. No midline shift. Vascular: No hyperdense vessel.  Atherosclerotic calcifications. Skull: No calvarial fracture or aggressive  osseous lesion. Sinuses/Orbits: No mass or acute finding within the imaged orbits. Mild mucosal thickening within the left frontal sinus. IMPRESSION: 1. No acute intracranial hemorrhage or evidence of an acute infarct. 2. Mild prominence of the pituitary gland, new from the prior MRI of 10/15/2010 and concerning for a possible underlying pituitary mass. A non-emergent pituitary protocol brain MRI (with and without contrast) is recommended for further evaluation. 3. Mild chronic small vessel ischemic changes within the cerebral white matter. 4. Generalized cerebral atrophy. 5. Mild left frontal sinus mucosal thickening. Electronically Signed   By: Bascom Lily D.O.   On: 04/09/2024 12:08     Small cell lung cancer, right [facility]) # 78 year old female patient with multiple medical problems including COPD, diabetes, lung cancer-was admitted hospital for pneumonia/progressive malignancy/    # Right-sided pleural effusion malignant-metastatic stage IV lung cancer-small cell.  I reviewed the pathology and staging with the patient in detail.  # I had a long discussion with the patient and family given the incurable nature of the disease /expected poor tolerance of therapy.  I I introduced hospice philosophy to the patient and family.  Of note patient is also not interested in any systemic therapy.  Discussed that radiation therapy is not helpful given stage IV disease.  Discussed that goal of care should be directed to symptom management rather than treating the underlying disease; and in the process help improve quality of life rather than quantity.  Discussed with hospice team would include-nurse, nurse aide, social worker and chaplain for help take care of patient with physical/emotional needs.  Patient's mother went through hospice appx 20 years ago.   # DISPOSITION: # hospice referral # follow up as needed- Dr.B    Above plan of care was discussed with patient/family in detail.  My contact information  was given  to the patient/family.       Gwyn Leos, MD 04/23/2024 12:13 PM

## 2024-04-23 NOTE — Progress Notes (Signed)
 Palliative Medicine [facility] at [facility] Telephone:([phone] Fax:([phone]   Name: Michelle Hays Date: 04/23/2024 MRN: [id number]  DOB: 1946/03/04  Patient Care Team: Caresse Chant, FNP as PCP - General (Family Medicine) Queenie Brunet, RN as Registered Nurse Randalyn Bushman, Allene Ivan, MD as Consulting Physician (Obstetrics and Gynecology) Schermerhorn, Joselyn Nicely, MD as Referring Physician (Obstetrics and Gynecology) Glenis Langdon, MD as Consulting Physician (Radiation Oncology) Drake Gens, RN as Oncology Nurse Navigator Gwyn Leos, MD as Consulting Physician (Oncology)    REASON FOR CONSULTATION: Michelle Hays is a 78 y.o. female with multiple medical problems including history of presumed lung cancer but patient never received bronchoscopy or tissue diagnosis.  This was treated with XRT.  Patient admitted to hospital on 04/09/2024 with progressive weakness, dysphagia.  CTA shows significant progression of metastatic disease with new involvement of liver and bilateral adrenals.  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  Palliative care consulted to address goals. .   SOCIAL HISTORY:     reports that she has quit smoking. Her smoking use included cigarettes. She has a 104 pack-year smoking history. She has never used smokeless tobacco. She reports that she does not drink alcohol and does not use drugs.  Patient is widowed.  Lives at home with her son and family.  Patient did not work outside the home.  ADVANCE DIRECTIVES:  Not on file  CODE STATUS: DNR  PAST MEDICAL HISTORY: Past Medical History:  Diagnosis Date   Anxiety    Arthritis    Asthma    Atypical squamous cell changes of undetermined significance (ASCUS) on cervical cytology with positive high risk human papilloma virus (HPV) 03/16/2015   BP (high blood pressure) 06/27/2014   Breast cancer  (HCC)    Breast cancer, left breast (HCC) 1995   Left Breast    CAFL (chronic airflow limitation) (HCC)    COPD (chronic obstructive pulmonary disease) (HCC)    DDD (degenerative disc disease), lumbar    Depression    Diabetes mellitus without complication (HCC)    Patient takes Januvia   Endometrial cancer determined by uterine biopsy (HCC) 2016   GERD (gastroesophageal reflux disease)    Heart murmur    Hyperlipidemia    Hypertension    IBS (irritable bowel syndrome)    Migraine    Osteopenia    Stroke (HCC)     PAST SURGICAL HISTORY:  Past Surgical History:  Procedure Laterality Date   ABDOMINAL HYSTERECTOMY     BREAST LUMPECTOMY  2012   [facility]nett-bilateral breast lumpectomy   CATARACT EXTRACTION  2013   CHOLECYSTECTOMY     CHOLECYSTECTOMY, LAPAROSCOPIC     COLONOSCOPY WITH PROPOFOL  N/A 07/06/2016   Procedure: COLONOSCOPY WITH PROPOFOL ;  Surgeon: Deveron Fly, MD;  Location: [facility] ENDOSCOPY;  Service: Endoscopy;  Laterality: N/A;   ESOPHAGOGASTRODUODENOSCOPY  03/02/2008, 10/12/2010, 03/28/2013   ESOPHAGOGASTRODUODENOSCOPY (EGD) WITH PROPOFOL  N/A 07/06/2016   Procedure: ESOPHAGOGASTRODUODENOSCOPY (EGD) WITH PROPOFOL ;  Surgeon: Deveron Fly, MD;  Location: [facility] ENDOSCOPY;  Service: Endoscopy;  Laterality: N/A;   EYE SURGERY     LAPAROSCOPIC BILATERAL SALPINGO OOPHERECTOMY Bilateral 05/20/2015   Procedure: LAPAROSCOPIC BILATERAL SALPINGO OOPHORECTOMY;  Surgeon: Nobie Batch, MD;  Location: [facility] ORS;  Service: Gynecology;  Laterality: Bilateral;   ROBOTIC ASSISTED TOTAL HYSTERECTOMY N/A 05/20/2015   Procedure: ROBOTIC ASSISTED TOTAL HYSTERECTOMY;  Surgeon: Nobie Batch, MD;  Location: [facility] ORS;  Service: Gynecology;  Laterality: N/A;    HEMATOLOGY/ONCOLOGY HISTORY:  Oncology History   No history exists.    ALLERGIES:  is allergic to alendronate sodium, metformin, nsaids, pantoprazole , salicylic acid, and sulfa antibiotics.  MEDICATIONS:  Current  Outpatient Medications  Medication Sig Dispense Refill   albuterol  (PROVENTIL  HFA;VENTOLIN  HFA) 108 (90 BASE) MCG/ACT inhaler Inhale 2 puffs into the lungs every 4 (four) hours as needed for wheezing. For wheezing     Apoaequorin (PREVAGEN) 10 MG CAPS Take 1 capsule by mouth daily.     ARIPiprazole  (ABILIFY ) 5 MG tablet Take 5 mg by mouth daily.     aspirin  EC 81 MG tablet Take 1 tablet by mouth daily.     atorvastatin  (LIPITOR) 40 MG tablet Take 40 mg by mouth daily.     chlorpheniramine-HYDROcodone  (TUSSIONEX) 10-8 MG/5ML Take 5 mLs by mouth every 12 (twelve) hours as needed for cough. 115 mL 0   cholecalciferol (VITAMIN D) 400 units TABS tablet Take 1,000 Units by mouth daily.     Fluticasone-Umeclidin-Vilant (TRELEGY ELLIPTA) 200-62.5-25 MCG/ACT AEPB Inhale 1 Inhalation into the lungs daily.     loperamide (IMODIUM) 2 MG capsule Take by mouth as needed for diarrhea or loose stools.     LORazepam  (ATIVAN ) 1 MG tablet Take 1 mg by mouth at bedtime.     magnesium oxide (MAG-OX) 400 MG tablet Take 400 mg by mouth daily.     omeprazole  (PRILOSEC) 40 MG capsule Take 40 mg by mouth daily.     Probiotic Product (PROBIOTIC DAILY PO) Take 1 capsule by mouth daily.     sitaGLIPtin (JANUVIA) 100 MG tablet Take 100 mg by mouth daily.     sucralfate (CARAFATE) 1 GM/10ML suspension Take 1 g by mouth 4 (four) times daily -  with meals and at bedtime.     No current facility-administered medications for this visit.    VITAL SIGNS: There were no vitals taken for this visit. There were no vitals filed for this visit.  Estimated body mass index is 20.09 kg/m as calculated from the following:   Height as of an earlier encounter on 04/23/24: 5\' 10"  (1.778 m).   Weight as of an earlier encounter on 04/23/24: 140 lb (63.5 kg).  LABS: CBC:    Component Value Date/Time   WBC 9.6 04/11/2024 0537   HGB 11.0 (L) 04/11/2024 0537   HGB 11.9 (L) 02/12/2024 1240   HCT 33.9 (L) 04/11/2024 0537   PLT 344  04/11/2024 0537   PLT 266 02/12/2024 1240   MCV 87.6 04/11/2024 0537   NEUTROABS 3.9 01/17/2024 1323   LYMPHSABS 0.8 01/17/2024 1323   MONOABS 0.5 01/17/2024 1323   EOSABS 0.1 01/17/2024 1323   BASOSABS 0.0 01/17/2024 1323   Comprehensive Metabolic Panel:    Component Value Date/Time   NA 133 (L) 04/11/2024 0537   K 4.6 04/11/2024 0537   CL 105 04/11/2024 0537   CO2 24 04/11/2024 0537   BUN 16 04/11/2024 0537   CREATININE 1.00 04/11/2024 0537   CREATININE 0.83 01/17/2024 1323   GLUCOSE 138 (H) 04/11/2024 0537   CALCIUM  8.3 (L) 04/11/2024 0537   AST 22 04/09/2024 0959   AST 17 01/17/2024 1323   ALT 11 04/09/2024 0959   ALT 12 01/17/2024 1323   ALKPHOS 73 04/09/2024 0959   BILITOT 0.4 04/09/2024 0959   BILITOT 0.4 01/17/2024 1323   PROT 6.4 (L) 04/09/2024 0959   ALBUMIN 2.0 (L) 04/09/2024 1610  RADIOGRAPHIC STUDIES: US  THORACENTESIS ASP PLEURAL SPACE W/IMG GUIDE Result Date: 04/10/2024 INDICATION: 78 year old female with a history of right-sided lung cancer who presented to the ED with worsening fatigue. Currently being treated for pneumonia. Request for therapeutic and diagnostic right-sided thoracentesis. EXAM: ULTRASOUND GUIDED RIGHT THORACENTESIS MEDICATIONS: 1% lidocaine , 8 mL COMPLICATIONS: None immediate. PROCEDURE: An ultrasound guided thoracentesis was thoroughly discussed with the patient and questions answered. The benefits, risks, alternatives and complications were also discussed. The patient understands and wishes to proceed with the procedure. Written consent was obtained. Ultrasound was performed to localize and mark an adequate pocket of fluid in the right chest. The area was then prepped and draped in the normal sterile fashion. 1% Lidocaine  was used for local anesthesia. Under ultrasound guidance a 6 Fr Safe-T-Centesis catheter was introduced. Thoracentesis was performed. The catheter was removed and a dressing applied. FINDINGS: A total of approximately 1.3 L of  dark amber fluid was removed. Samples were sent to the laboratory as requested by the clinical team. IMPRESSION: Successful ultrasound guided right thoracentesis yielding 1.3 L of pleural fluid. Procedure performed by: Estella Helling, PA-C under the supervision of Dr. Irine Manning Electronically Signed   By: Elene Griffes M.D.   On: 04/10/2024 14:27   DG Chest Port 1 View Result Date: 04/10/2024 CLINICAL DATA:  Status post right thoracentesis. EXAM: PORTABLE CHEST 1 VIEW COMPARISON:  04/09/2024 and chest CT 04/09/2024 FINDINGS: Improved aeration at the right lung base. Residual right basilar densities could represent atelectasis or small amount of residual pleural fluid. There is a linear opacity overlying the right upper chest that is likely external to the patient. No large pneumothorax. Slightly increased densities in left mid lung are probably related to overlying structures. Heart size is upper limits of normal but stable. Patient is slightly rotated towards the left. Atherosclerotic calcifications at the aortic arch. IMPRESSION: 1. Improved aeration at the right lung base. No significant pneumothorax. 2. Residual right basilar densities could represent atelectasis or small amount of pleural fluid. Electronically Signed   By: Elene Griffes M.D.   On: 04/10/2024 13:23   MR BRAIN W WO CONTRAST Result Date: 04/10/2024 CLINICAL DATA:  Abnormal pituitary gland by CT EXAM: MRI HEAD WITHOUT AND WITH CONTRAST TECHNIQUE: Multiplanar, multiecho pulse sequences of the brain and surrounding structures were obtained without and with intravenous contrast. CONTRAST:  6mL GADAVIST  GADOBUTROL  1 MMOL/ML IV SOLN COMPARISON:  Head CT from yesterday FINDINGS: Brain: Hypoenhancing (relative to pituitary) mass in the sella, ventral and right eccentric, measuring up to 19 x 10 mm. Although there is evidence of widespread metastatic disease, the epicenter is in the pituitary parenchyma. Dural thickening along the inner table of the left  frontal bone measuring 3.2 by 0.9 cm. Milder, thin dural thickening along the inner table of the right frontal bone. Suspect direct invasion of the dura of from calvarial metastases given overlying osseous metastatic disease which is widespread in the calvarium, at the right occipital condyle, and at the C3 body. No acute infarct, acute hemorrhage, hydrocephalus, or collection. 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 This could be pituitary adenoma or metastasis, recommend close follow-up. 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 Electronically Signed   By: Ronnette Coke M.D.   On: 04/10/2024 08:10   CT Angio Chest PE W and/or Wo  Contrast Result Date: 04/09/2024 CLINICAL DATA:  Shortness of breath and weakness. Right hilar and lower lobe mass/bronchogenic carcinoma. EXAM: CT ANGIOGRAPHY CHEST WITH CONTRAST TECHNIQUE: Multidetector CT imaging of the chest was performed using the standard protocol during bolus administration of intravenous contrast. Multiplanar CT image reconstructions and MIPs were obtained to evaluate the vascular anatomy. RADIATION DOSE REDUCTION: This exam was performed according to the departmental dose-optimization program which includes automated exposure control, adjustment of the mA and/or kV according to patient size and/or use of iterative reconstruction technique. CONTRAST:  75mL OMNIPAQUE  IOHEXOL  350 MG/ML SOLN COMPARISON:  Chest CT dated 08/02/2017. FINDINGS: Cardiovascular: There is no cardiomegaly. Advanced 3 vessel coronary vascular calcification. Small pericardial effusion measuring 6 mm thickness. Moderate atherosclerotic calcification of the thoracic aorta. No aneurysmal dilatation or dissection. The origins of the great vessels of the aortic arch  appear patent. No pulmonary artery embolus identified. Mediastinum/Nodes: There is a 2.8 x 4.1 cm right infrahilar/right lower lobe mass in keeping with known malignancy. Right paratracheal adenopathy measure 19 mm in short axis. Right supraclavicular adenopathy measure up to 22 mm. Bulky right axillary adenopathy measure 17 mm short axis. There is a small hiatal hernia. The esophagus is grossly unremarkable. No mediastinal fluid collection. Lungs/Pleura: Moderate right pleural effusion. There is consolidative changes of the majority of the right lower lobe which may represent atelectasis or pneumonia versus infiltrative mass. Background of centrilobular emphysema. There is diffuse peribronchial thickening with mucous impaction in the right middle and left lower lobe bronchi. There is high-grade narrowing of the bronchus intermedius which may be related to mucous impaction or tumor infiltration. No pneumothorax. Upper Abdomen: Bilateral adrenal nodules suspicious for metastasis. Faint low attenuating lesions in the liver also concerning for metastasis. There is retroperitoneal adenopathy. Partially visualized 3.5 x 4.0 cm ovoid mass in the portacaval region, likely adenopathy. Musculoskeletal: Osteopenia with degenerative changes of spine. No acute osseous pathology. Review of the MIP images confirms the above findings. IMPRESSION: 1. No CT evidence of pulmonary artery embolus. 2. Right infrahilar/right lower lobe mass in keeping with known malignancy. 3. Moderate right pleural effusion. 4. Consolidative changes of the majority of the right lower lobe may represent atelectasis or pneumonia versus infiltrative mass. 5. Diffuse peribronchial thickening with mucous impaction in the right middle and left lower lobe bronchi. High-grade narrowing of the bronchus intermedius may be related to mucous impaction or tumor infiltration. 6. Bilateral adrenal nodules suspicious for metastasis. 7. Hepatic metastasis. 8.  Retroperitoneal adenopathy. 9. Aortic Atherosclerosis (ICD10-I70.0) and Emphysema (ICD10-J43.9). Electronically Signed   By: Angus Bark M.D.   On: 04/09/2024 13:33   DG Chest 2 View Result Date: 04/09/2024 CLINICAL DATA:  Fatigue. Shortness of breath. History of lung cancer. EXAM: CHEST - 2 VIEW COMPARISON:  Chest CT dated 05/02/2023. FINDINGS: Small right pleural effusion and right lung base atelectasis or infiltrate, new since the prior radiograph. The known right infrahilar mass is not visualized due to right lung base density. No pneumothorax. Stable cardiac silhouette. Atherosclerotic calcification of the aorta. No acute osseous pathology. IMPRESSION: Small right pleural effusion and right lung base atelectasis or infiltrate. Electronically Signed   By: Angus Bark M.D.   On: 04/09/2024 12:29   CT HEAD WO CONTRAST ( ) Result Date: 04/09/2024 CLINICAL DATA:  Provided history: Weakness, unsteady gait, fall with head strike approximately 1.5 weeks ago. EXAM: CT HEAD WITHOUT CONTRAST TECHNIQUE: Contiguous axial images were obtained from the base of the skull through the vertex without intravenous contrast. RADIATION DOSE  REDUCTION: This exam was performed according to the departmental dose-optimization program which includes automated exposure control, adjustment of the mA and/or kV according to patient size and/or use of iterative reconstruction technique. COMPARISON:  Brain MRI 10/15/2010. FINDINGS: Brain: Generalized cerebral atrophy. Mild chronic small vessel ischemic changes within the cerebral white matter, occult by CT and better appreciated on the prior brain MRI of 10/15/2010. Mild prominence of the pituitary gland (with slight bulging into the suprasellar cistern), new from the prior MRI. There is no acute intracranial hemorrhage. No demarcated cortical infarct. No extra-axial fluid collection. No midline shift. Vascular: No hyperdense vessel.  Atherosclerotic calcifications. Skull: No  calvarial fracture or aggressive osseous lesion. Sinuses/Orbits: No mass or acute finding within the imaged orbits. Mild mucosal thickening within the left frontal sinus. IMPRESSION: 1. No acute intracranial hemorrhage or evidence of an acute infarct. 2. Mild prominence of the pituitary gland, new from the prior MRI of 10/15/2010 and concerning for a possible underlying pituitary mass. A non-emergent pituitary protocol brain MRI (with and without contrast) is recommended for further evaluation. 3. Mild chronic small vessel ischemic changes within the cerebral white matter. 4. Generalized cerebral atrophy. 5. Mild left frontal sinus mucosal thickening. Electronically Signed   By: Bascom Lily D.O.   On: 04/09/2024 12:08    PERFORMANCE STATUS (ECOG) : 3 - Symptomatic, >50% confined to bed  Review of Systems Unless otherwise noted, a complete review of systems is negative.  Physical Exam General: Thin, frail-appearing Pulmonary: Unlabored Extremities: no edema, no joint deformities Skin: no rashes Neurological: Weakness but otherwise nonfocal  IMPRESSION: Patient presents to clinic today for posthospital follow-up.  She was also seen by Dr. Valentine Gasmen.  Patient accompanied by her daughter-in-law.  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  This would reflect an advanced age and incurable process.  Patient continues to states that she is not interested in any chemotherapy or other cancer treatments.  She says that her goal is comfort.  Patient is in agreement with hospice involvement and I will coordinate that referral today.  DNR/DNI confirmed.  DNR order was signed for patient to take time.  Symptomatically, patient endorses some shortness of breath, worse with exertion.  She is having difficulty taking her inhalers due to requirement for deep breathing.  She may benefit from O2 for comfort.  Will send Rx for morphine  elixir for management of  dyspnea.  PLAN: -Comfort care -Referral to hospice -Morphine  for pain/dyspnea. -DNR/DNI -Follow-up as needed  Case and plan discussed with Dr. Valentine Gasmen  Patient expressed understanding and was in agreement with this plan. She also understands that She can call the clinic at any time with any questions, concerns, or complaints.     Time Total: 30 minutes  Visit consisted of counseling and education dealing with the complex and emotionally intense issues of symptom management and palliative care in the setting of serious and potentially life-threatening illness.Greater than 50%  of this time was spent counseling and coordinating care related to the above assessment and plan.  Signed by: Gerilyn Kobus, PhD, NP-C

## 2024-04-23 NOTE — Assessment & Plan Note (Addendum)
#   78 year old female patient with multiple medical problems including COPD, diabetes, lung cancer-was admitted hospital for pneumonia/progressive malignancy/    # Right-sided pleural effusion malignant-metastatic stage IV lung cancer-small cell.  I reviewed the pathology and staging with the patient in detail.  # I had a long discussion with the patient and family given the incurable nature of the disease /expected poor tolerance of therapy.  I I introduced hospice philosophy to the patient and family.  Of note patient is also not interested in any systemic therapy.  Discussed that radiation therapy is not helpful given stage IV disease.  Discussed that goal of care should be directed to symptom management rather than treating the underlying disease; and in the process help improve quality of life rather than quantity.  Discussed with hospice team would include-nurse, nurse aide, social worker and chaplain for help take care of patient with physical/emotional needs.  Patient's mother went through hospice appx 20 years ago.   # DISPOSITION: # hospice referral # follow up as needed- Dr.B

## 2024-04-23 NOTE — Progress Notes (Signed)
 Patient is so weak she can't get out of the chair by herself. She is having a hard time taking a deep breath. She was discharged from the hospital about a week or two ago from not being able to breath and move around much.

## 2024-04-25 ENCOUNTER — Telehealth: Payer: Self-pay | Admitting: *Deleted

## 2024-04-25 ENCOUNTER — Inpatient Hospital Stay: Admitting: Hospice and Palliative Medicine

## 2024-04-25 DIAGNOSIS — C7971 Secondary malignant neoplasm of right adrenal gland: Secondary | ICD-10-CM

## 2024-04-25 DIAGNOSIS — Z515 Encounter for palliative care: Secondary | ICD-10-CM

## 2024-04-25 DIAGNOSIS — C787 Secondary malignant neoplasm of liver and intrahepatic bile duct: Secondary | ICD-10-CM | POA: Diagnosis not present

## 2024-04-25 DIAGNOSIS — C7972 Secondary malignant neoplasm of left adrenal gland: Secondary | ICD-10-CM

## 2024-04-25 DIAGNOSIS — C3491 Malignant neoplasm of unspecified part of right bronchus or lung: Secondary | ICD-10-CM | POA: Diagnosis not present

## 2024-04-25 DIAGNOSIS — R0602 Shortness of breath: Secondary | ICD-10-CM

## 2024-04-25 DIAGNOSIS — Z923 Personal history of irradiation: Secondary | ICD-10-CM

## 2024-04-25 MED ORDER — LORAZEPAM 0.5 MG PO TABS: 0.5000 mg | ORAL_TABLET | Freq: Four times a day (QID) | ORAL | 0 refills | Status: DC | PRN

## 2024-04-25 NOTE — Telephone Encounter (Signed)
 I talked to family at her home and Michelle Hays is the person that helps with the hospice .  He was talking to [facility] that she is getting shortness of breath bad and needs some oxygen.  They are supposed to come today around 1.  I called back to the family member and let them know that they are coming today and we did address that the shortness of breath so they need to get that set up with oxygen,

## 2024-04-25 NOTE — Progress Notes (Signed)
 Virtual Visit via Telephone Note  I connected with Michelle Hays on 04/25/24 at  3:00 PM EDT by telephone and verified that I am speaking with the correct person using two identifiers.  Location: Patient: Home Provider: Clinic   I discussed the limitations, risks, security and privacy concerns of performing an evaluation and management service by telephone and the availability of in person appointments. I also discussed with the patient that there may be a patient responsible charge related to this service. The patient expressed understanding and agreed to proceed.   History of Present Illness: Michelle Hays is a 78 y.o. female with multiple medical problems including history of presumed lung cancer but patient never received bronchoscopy or tissue diagnosis.  This was treated with XRT.  Patient admitted to hospital on 04/09/2024 with progressive weakness, dysphagia.  CTA shows significant progression of metastatic disease with new involvement of liver and bilateral adrenals.  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  Palliative care consulted to address goals.    Observations/Objective: Patient was referred to hospice but they have not yet started care.  Discussed with daughter-in-law today.  Patient short of breath.  Patient is mostly immobile.  We did discuss possible option of chest x-ray as I suspect recurrent pleural effusion.  However, patient is not interested in returning to the hospital and just wants to be comfortable at home.  Daughter-in-law has given a dose of morphine  today, which helped.  Will refill lorazepam  and liberalize dosing.  We also discussed nonpharmacological strategies for shortness of breath.  Case discussed with hospice referral team.  They plan to prioritize hospice start of care for later today.  Assessment and Plan: Stage IV small cell lung cancer -pending initiation of hospice  Dyspnea  -secondary to new neoplasm.  Maximize use of as needed morphine .  Add as needed lorazepam .  Patient may benefit from O2 once hospice starts.  Follow Up Instructions: As needed   I discussed the assessment and treatment plan with the patient. The patient was provided an opportunity to ask questions and all were answered. The patient agreed with the plan and demonstrated an understanding of the instructions.   The patient was advised to call back or seek an in-person evaluation if the symptoms worsen or if the condition fails to improve as anticipated.  I provided 10 minutes of non-face-to-face time during this encounter.   Peggyann Bower, NP

## 2024-05-21 DEATH — deceased
